# Patient Record
Sex: Female | Born: 1985 | Hispanic: Yes | Marital: Married | Smoking: Never smoker
Health system: Southern US, Community
[De-identification: ages and names within clinical notes are randomized; demographics above are authoritative.]

## PROBLEM LIST (undated history)

## (undated) DIAGNOSIS — K219 Gastro-esophageal reflux disease without esophagitis: Secondary | ICD-10-CM

## (undated) DIAGNOSIS — F32A Depression, unspecified: Secondary | ICD-10-CM

## (undated) DIAGNOSIS — Z8759 Personal history of other complications of pregnancy, childbirth and the puerperium: Secondary | ICD-10-CM

## (undated) DIAGNOSIS — Z789 Other specified health status: Secondary | ICD-10-CM

## (undated) DIAGNOSIS — R45851 Suicidal ideations: Secondary | ICD-10-CM

## (undated) DIAGNOSIS — I729 Aneurysm of unspecified site: Secondary | ICD-10-CM

## (undated) DIAGNOSIS — R0602 Shortness of breath: Secondary | ICD-10-CM

## (undated) DIAGNOSIS — G459 Transient cerebral ischemic attack, unspecified: Secondary | ICD-10-CM

## (undated) DIAGNOSIS — C801 Malignant (primary) neoplasm, unspecified: Secondary | ICD-10-CM

## (undated) HISTORY — DX: Transient cerebral ischemic attack, unspecified: G45.9

## (undated) HISTORY — DX: Suicidal ideations: R45.851

## (undated) HISTORY — PX: CHOLECYSTECTOMY: SHX55

## (undated) HISTORY — DX: Depression, unspecified: F32.A

## (undated) HISTORY — PX: NO PAST SURGERIES: SHX2092

---

## 2011-11-02 ENCOUNTER — Ambulatory Visit (INDEPENDENT_AMBULATORY_CARE_PROVIDER_SITE_OTHER): Payer: BC Managed Care – PPO | Admitting: Family Medicine

## 2011-11-02 VITALS — BP 126/82 | HR 113 | Temp 98.2°F | Resp 16 | Ht 65.0 in | Wt 145.2 lb

## 2011-11-02 DIAGNOSIS — L709 Acne, unspecified: Secondary | ICD-10-CM

## 2011-11-02 DIAGNOSIS — Z131 Encounter for screening for diabetes mellitus: Secondary | ICD-10-CM

## 2011-11-02 MED ORDER — NORGESTIM-ETH ESTRAD TRIPHASIC 0.18/0.215/0.25 MG-25 MCG PO TABS: 1.0000 | ORAL_TABLET | Freq: Every day | ORAL | Status: DC

## 2011-11-02 NOTE — Progress Notes (Signed)
New Patient Visit:  HPI:  Pt presents today with some concern for possible diabetes.  Pt states that she has a significant family history diabetes and would like to be checked for this.  No polydypsia. Mild polyuria. Mild polyphagia.  Weight has been stable.  No visual changes.   Pt also reports hx/o recurrent acne.  This has been present for > 2years.  Acne started after pt started and then stopped OCPs.  Pt denies acne prior to OCP course.  Has been seen by derm in the past.  Has been on oral and topical meds with mild improvement in sxs.  Pt is tired of taking meds and cannot afford specialist visits.  Is considering going back on OCPs to help the acne.  Currently married. Does not desire children.   There is no problem list on file for this patient.  Past Medical History: History reviewed. No pertinent past medical history.  Past Surgical History: History reviewed. No pertinent past surgical history.  Social History: History   Social History  . Marital Status: Married    Spouse Name: N/A    Number of Children: N/A  . Years of Education: N/A   Social History Main Topics  . Smoking status: Never Smoker   . Smokeless tobacco: None  . Alcohol Use: 1.8 oz/week    3 Cans of beer per week  . Drug Use: No  . Sexually Active: Yes    Birth Control/ Protection: None   Other Topics Concern  . None   Social History Narrative  . None    Family History: History reviewed. No pertinent family history.  Allergies: Not on File  Current Outpatient Prescriptions  Medication Sig Dispense Refill  . dimethicone 1 % cream Apply topically 2 (two) times daily as needed.      . Norgestimate-Ethinyl Estradiol Triphasic 0.18/0.215/0.25 MG-25 MCG tab Take 1 tablet by mouth daily.  1 Package  11   Review Of Systems: 12 point ROS negative except as noted above in HPI.   Physical Exam: Filed Vitals:   11/02/11 1045  BP: 126/82  Pulse: 113  Temp: 98.2 F (36.8 C)  Resp: 16     General: alert and cooperative HEENT: PERRLA and extra ocular movement intact Heart: S1, S2 normal, no murmur, rub or gallop, regular rate and rhythm Lungs: clear to auscultation, no wheezes or rales and unlabored breathing Abdomen: abdomen is soft without significant tenderness, masses, organomegaly or guarding Extremities: extremities normal, atraumatic, no cyanosis or edema Skin:diffuse papules and erythema consistent with acne  Neurology: normal without focal findings  Labs and Imaging: No results found for this basename: na, k, cl, co2, bun, creatinine, glucose   No results found for this basename: WBC, HGB, HCT, MCV, PLT    Assessment and Plan: Will screen for DM.  Will also check TSH and metabolic function.  Will start on sprintec for contraceptive coverage and acne suppression.  Consider re-referral to derm if acne persists despite OCP treatment.

## 2011-11-02 NOTE — Progress Notes (Deleted)
  Subjective:    Patient ID: Shelby Cunningham, female    DOB: 04-12-1985, 26 y.o.   MRN: [id number]  HPI    Review of Systems     Objective:   Physical Exam        Assessment & Plan:

## 2011-11-03 LAB — COMPREHENSIVE METABOLIC PANEL
AST: 24 U/L (ref 0–37)
Albumin: 4.8 g/dL (ref 3.5–5.2)
Alkaline Phosphatase: 77 U/L (ref 39–117)
BUN: 12 mg/dL (ref 6–23)
Glucose, Bld: 81 mg/dL (ref 70–99)
Potassium: 4.3 mEq/L (ref 3.5–5.3)
Sodium: 139 mEq/L (ref 135–145)
Total Bilirubin: 0.5 mg/dL (ref 0.3–1.2)

## 2011-11-03 LAB — HEMOGLOBIN A1C: Hgb A1c MFr Bld: 5.2 % (ref <5.7)

## 2011-11-11 ENCOUNTER — Telehealth: Payer: Self-pay

## 2011-11-11 NOTE — Telephone Encounter (Signed)
FYI, I called her and advised labs normal, mailed her a copy. Shelby Cunningham

## 2011-11-11 NOTE — Telephone Encounter (Signed)
Patient has called back saying she just started taking BCP and is feeling muscle soreness in thighs and lower back and wants to know if this is normal.   Best [phone]

## 2011-11-11 NOTE — Telephone Encounter (Signed)
Please advise 

## 2011-11-11 NOTE — Telephone Encounter (Signed)
Patient had labs done on 11/02/11 and has not received a call or letter regarding the results.  Best [phone]

## 2011-11-12 NOTE — Telephone Encounter (Signed)
This certainly could be from the birth control as it is a hormone. With any new hormone that you are introducing into your body it will need time to acclimate. If these persist, change, or worsen over the next week please let us know.

## 2011-11-13 NOTE — Telephone Encounter (Signed)
Spoke with pt advised to call us if sx's persist and msg from [place]. Pt understood

## 2012-01-18 DIAGNOSIS — G459 Transient cerebral ischemic attack, unspecified: Secondary | ICD-10-CM

## 2012-01-18 HISTORY — DX: Transient cerebral ischemic attack, unspecified: G45.9

## 2012-01-28 ENCOUNTER — Emergency Department (HOSPITAL_COMMUNITY): Payer: BC Managed Care – PPO

## 2012-01-28 ENCOUNTER — Observation Stay (HOSPITAL_COMMUNITY)
Admission: EM | Admit: 2012-01-28 | Discharge: 2012-01-31 | Disposition: A | Discharge status: Home / Self Care | Payer: BC Managed Care – PPO | Attending: Family Medicine | Admitting: Family Medicine

## 2012-01-28 ENCOUNTER — Encounter (HOSPITAL_COMMUNITY): Payer: Self-pay | Admitting: *Deleted

## 2012-01-28 DIAGNOSIS — E785 Hyperlipidemia, unspecified: Secondary | ICD-10-CM | POA: Insufficient documentation

## 2012-01-28 DIAGNOSIS — G459 Transient cerebral ischemic attack, unspecified: Principal | ICD-10-CM | POA: Diagnosis present

## 2012-01-28 DIAGNOSIS — G8191 Hemiplegia, unspecified affecting right dominant side: Secondary | ICD-10-CM | POA: Diagnosis present

## 2012-01-28 DIAGNOSIS — R209 Unspecified disturbances of skin sensation: Secondary | ICD-10-CM | POA: Insufficient documentation

## 2012-01-28 DIAGNOSIS — I671 Cerebral aneurysm, nonruptured: Secondary | ICD-10-CM | POA: Insufficient documentation

## 2012-01-28 DIAGNOSIS — G8190 Hemiplegia, unspecified affecting unspecified side: Secondary | ICD-10-CM | POA: Insufficient documentation

## 2012-01-28 HISTORY — DX: Shortness of breath: R06.02

## 2012-01-28 HISTORY — DX: Other specified health status: Z78.9

## 2012-01-28 NOTE — ED Notes (Signed)
Pt c/o R arm numbness, sudden onset x 2 hrs ago. Pt states numbness radiated from arm to R neck and then began experiencing R leg pain, numbness and heaviness. Pt c/o intense pain w/ ambulation in medial R knee. Pt's grips, pushes and pulls are equal upon evaluation in triage. Pt is ambulatory w/o gait disturbance or deficit.

## 2012-01-28 NOTE — ED Provider Notes (Signed)
History     CSN: [id number]  Arrival date & time 01/28/12  2221   First MD Initiated Contact with Patient 01/28/12 2321      Chief Complaint  Patient presents with  . Numbness    (Consider location/radiation/quality/duration/timing/severity/associated sxs/prior treatment) The history is provided by the patient.  Symptoms of RUW numbness not tingling per the patient starting approximately 2 hours pta at rest.  No trauma.  No f/c/r.  No weakness.  No changes in vision nor speech.  No repeptitive motion activity.  No antecedent illness.  No CP nor SOB.  Risk factor is patient is on OCP.  No additional medications.    History reviewed. No pertinent past medical history.  History reviewed. No pertinent past surgical history.  History reviewed. No pertinent family history.  History  Substance Use Topics  . Smoking status: Never Smoker   . Smokeless tobacco: Not on file  . Alcohol Use: 1.8 oz/week    3 Cans of beer per week    OB History    Grav Para Term Preterm Abortions TAB SAB Ect Mult Living                  Review of Systems  Neurological: Negative for dizziness, facial asymmetry, light-headedness and headaches.  All other systems reviewed and are negative.    Allergies  Review of patient's allergies indicates no known allergies.  Home Medications   Current Outpatient Rx  Name  Route  Sig  Dispense  Refill  . ASPIRIN-ACETAMINOPHEN-CAFFEINE 250-250-65 MG PO TABS   Oral   Take 2 tablets by mouth every 6 (six) hours as needed.         Darlis Loan ESTRAD TRIPHASIC 0.18/0.215/0.25 MG-25 MCG PO TABS   Oral   Take 1 tablet by mouth daily.           BP 130/83  Pulse 91  Temp 97.8 F (36.6 C) (Oral)  Resp 20  SpO2 100%  LMP 01/27/2012  Physical Exam  Constitutional: She is oriented to person, place, and time. She appears well-developed and well-nourished.  HENT:  Head: Normocephalic and atraumatic.  Mouth/Throat: Oropharynx is clear and moist.    Eyes: Conjunctivae normal and EOM are normal. Pupils are equal, round, and reactive to light.  Neck: Normal range of motion. Neck supple.  Cardiovascular: Normal rate, regular rhythm and intact distal pulses.   Pulmonary/Chest: Effort normal and breath sounds normal. She has no wheezes. She has no rales.  Abdominal: Soft. Bowel sounds are normal. There is no tenderness. There is no rebound and no guarding.  Musculoskeletal: Normal range of motion. She exhibits no tenderness.  Neurological: She is alert and oriented to person, place, and time. She has normal reflexes. She displays normal reflexes. No cranial nerve deficit.  Skin: Skin is warm and dry.  Psychiatric: She has a normal mood and affect.    ED Course  Procedures (including critical care time)  Labs Reviewed - No data to display No results found.   No diagnosis found.    MDM  Seen by Dr. Amada Jupiter of neuro, needs inpatient TIA work up        Nakyiah Kuck Smitty Cords, MD 01/29/12 [phone]

## 2012-01-29 ENCOUNTER — Observation Stay (HOSPITAL_COMMUNITY): Payer: BC Managed Care – PPO

## 2012-01-29 ENCOUNTER — Encounter (HOSPITAL_COMMUNITY): Payer: Self-pay | Admitting: Emergency Medicine

## 2012-01-29 DIAGNOSIS — G8191 Hemiplegia, unspecified affecting right dominant side: Secondary | ICD-10-CM | POA: Diagnosis present

## 2012-01-29 DIAGNOSIS — G8190 Hemiplegia, unspecified affecting unspecified side: Secondary | ICD-10-CM

## 2012-01-29 DIAGNOSIS — G459 Transient cerebral ischemic attack, unspecified: Principal | ICD-10-CM

## 2012-01-29 LAB — CBC WITH DIFFERENTIAL/PLATELET
Basophils Absolute: 0.1 10*3/uL (ref 0.0–0.1)
Basophils Relative: 1 % (ref 0–1)
Eosinophils Absolute: 0.2 10*3/uL (ref 0.0–0.7)
Eosinophils Relative: 3 % (ref 0–5)
HCT: 41.6 % (ref 36.0–46.0)
Hemoglobin: 14.4 g/dL (ref 12.0–15.0)
MCH: 28.1 pg (ref 26.0–34.0)
MCHC: 34.6 g/dL (ref 30.0–36.0)
MCV: 81.1 fL (ref 78.0–100.0)
Monocytes Absolute: 0.6 10*3/uL (ref 0.1–1.0)
Monocytes Relative: 8 % (ref 3–12)
Neutro Abs: 3.9 10*3/uL (ref 1.7–7.7)
RDW: 12.2 % (ref 11.5–15.5)

## 2012-01-29 LAB — POCT I-STAT, CHEM 8
Calcium, Ion: 1.23 mmol/L (ref 1.12–1.23)
Chloride: 105 mEq/L (ref 96–112)
Glucose, Bld: 96 mg/dL (ref 70–99)
HCT: 44 % (ref 36.0–46.0)
Hemoglobin: 15 g/dL (ref 12.0–15.0)
TCO2: 27 mmol/L (ref 0–100)

## 2012-01-29 LAB — HEMOGLOBIN A1C: Hgb A1c MFr Bld: 5.4 % (ref <5.7)

## 2012-01-29 LAB — PREGNANCY, URINE: Preg Test, Ur: NEGATIVE

## 2012-01-29 MED ORDER — INFLUENZA VIRUS VACC SPLIT PF IM SUSP
0.5000 mL | INTRAMUSCULAR | Status: AC
  Filled 2012-01-29: qty 0.5

## 2012-01-29 MED ORDER — LORAZEPAM 2 MG/ML IJ SOLN
0.5000 mg | Freq: Once | INTRAMUSCULAR | Status: AC
  Administered 2012-01-29: 1 mg via INTRAVENOUS
  Filled 2012-01-29: qty 1

## 2012-01-29 MED ORDER — ASPIRIN EC 325 MG PO TBEC
325.0000 mg | DELAYED_RELEASE_TABLET | Freq: Every day | ORAL | Status: DC
  Administered 2012-01-29: 325 mg via ORAL
  Filled 2012-01-29: qty 1

## 2012-01-29 MED ORDER — ACETAMINOPHEN 325 MG PO TABS
650.0000 mg | ORAL_TABLET | ORAL | Status: DC | PRN
  Administered 2012-01-30 – 2012-01-31 (×2): 650 mg via ORAL
  Filled 2012-01-29 (×2): qty 2

## 2012-01-29 MED ORDER — GADOBENATE DIMEGLUMINE 529 MG/ML IV SOLN
13.0000 mL | Freq: Once | INTRAVENOUS | Status: AC | PRN
  Administered 2012-01-29: 13 mL via INTRAVENOUS

## 2012-01-29 MED ORDER — SODIUM CHLORIDE 0.9 % IV SOLN
INTRAVENOUS | Status: AC
  Administered 2012-01-29: 13:00:00 via INTRAVENOUS

## 2012-01-29 MED ORDER — ASPIRIN 325 MG PO TABS
325.0000 mg | ORAL_TABLET | Freq: Every day | ORAL | Status: DC
  Administered 2012-01-29 – 2012-01-31 (×3): 325 mg via ORAL
  Filled 2012-01-29 (×4): qty 1

## 2012-01-29 NOTE — ED Notes (Signed)
Mri CALLED TO ADVISE THAT SHE IS IN ROUTE TO DO PT mra?mri.  PT MADE AWARE

## 2012-01-29 NOTE — ED Notes (Signed)
Patient transported to MRI 

## 2012-01-29 NOTE — ED Notes (Signed)
Pt returned from MRI °

## 2012-01-29 NOTE — H&P (Signed)
PCP:  none   Chief Complaint:   Right side weakness  HPI: Shelby Cunningham is a 27 y.o. female   has no past medical history on file.   Presented with  8 pm she was watching TV and her whole Right arm was numb then her entire Right side was numb. She felt some tingling and numbness over right side of her face as well. She says it felt a little weak but she was able to move it and walk although with some difficulty. CT scan was unremarkable. Patient states she have had some sensation of her right side being heavy for the past year but the numbness was new. Since the event she has been able to ambulate without difficulty. She does currently endorses mild pain around right knee and sensation of discomfort around her right arm. Neurology have come down and evaluated the patient and wish for her to undergo TIA work up. Hospitalist to admit.   Review of Systems:    Pertinent positives include:   tingling,  Weakness, localizing neurological complaints   Constitutional:  No weight loss, night sweats, Fevers, chills, fatigue, weight loss  HEENT:  No headaches, Difficulty swallowing,Tooth/dental problems,Sore throat,  No sneezing, itching, ear ache, nasal congestion, post nasal drip,  Cardio-vascular:  No chest pain, Orthopnea, PND, anasarca, dizziness, palpitations.no Bilateral lower extremity swelling  GI:  No heartburn, indigestion, abdominal pain, nausea, vomiting, diarrhea, change in bowel habits, loss of appetite, melena, blood in stool, hematemesis Resp:  no shortness of breath at rest. No dyspnea on exertion, No excess mucus, no productive cough, No non-productive cough, No coughing up of blood.No change in color of mucus.No wheezing. Skin:  no rash or lesions. No jaundice GU:  no dysuria, change in color of urine, no urgency or frequency. No straining to urinate.  No flank pain.  Musculoskeletal:  no joint swelling. No decreased range of motion. No back pain.  Psych:  No change in  mood or affect. No depression or anxiety. No memory loss.  Neuro: no no double vision, no gait abnormality, no slurred speech, no confusion  Otherwise ROS are negative except for above, 10 systems were reviewed  Past Medical History: History reviewed. No pertinent past medical history. History reviewed. No pertinent past surgical history.   Medications: Prior to Admission medications   Medication Sig Start Date End Date Taking? Authorizing Provider  aspirin-acetaminophen-caffeine (PAMPRIN MAX) [phone] MG per tablet Take 2 tablets by mouth every 6 (six) hours as needed.   Yes Historical Provider, MD  Norgestimate-Ethinyl Estradiol Triphasic 0.18/0.215/0.25 MG-25 MCG tab Take 1 tablet by mouth daily. 11/02/11  Yes Doree Albee, MD    Allergies:  No Known Allergies  Social History:  Ambulatory   independently   Lives at   Home with husband   reports that she has never smoked. She does not have any smokeless tobacco history on file. She reports that she drinks about 1.8 ounces of alcohol per week. She reports that she does not use illicit drugs.   Family History: family history includes Diabetes in her mother.    Physical Exam: Patient Vitals for the past 24 hrs:  BP Temp Temp src Pulse Resp SpO2  01/28/12 2232 130/83 mmHg 97.8 F (36.6 C) Oral 91  20  100 %    1. General:  in No Acute distress 2. Psychological: Alert and  Oriented 3. Head/ENT:   Moist  Mucous Membranes  Head Non traumatic, neck supple                          Normal   Dentition 4. SKIN: normal   Skin turgor,  Skin clean Dry and intact no rash 5. Heart: Regular rate and rhythm no Murmur, Rub or gallop 6. Lungs: Clear to auscultation bilaterally, no wheezes or crackles   7. Abdomen: Soft, non-tender, Non distended 8. Lower extremities: no clubbing, cyanosis, or edema 9. Neurologically strength 5/5 in all 4 extremities, CN 2-12 intact no nastagmus present. Finger to nose  coordination intact bilaterally.  10. MSK: Normal range of motion  body mass index is unknown because there is no height or weight on file.   Labs on Admission:   [facility] 01/29/12 0008  NA 141  K 3.5  CL 105  CO2 --  GLUCOSE 96  BUN 12  CREATININE 0.80  CALCIUM --  MG --  PHOS --   No results found for this basename: AST:2,ALT:2,ALKPHOS:2,BILITOT:2,PROT:2,ALBUMIN:2 in the last 72 hours No results found for this basename: LIPASE:2,AMYLASE:2 in the last 72 hours  Basename 01/29/12 0008 01/28/12 2345  WBC -- 7.7  NEUTROABS -- 3.9  HGB 15.0 14.4  HCT 44.0 41.6  MCV -- 81.1  PLT -- 283   No results found for this basename: CKTOTAL:3,CKMB:3,CKMBINDEX:3,TROPONINI:3 in the last 72 hours No results found for this basename: TSH,T4TOTAL,FREET3,T3FREE,THYROIDAB in the last 72 hours No results found for this basename: VITAMINB12:2,FOLATE:2,FERRITIN:2,TIBC:2,IRON:2,RETICCTPCT:2 in the last 72 hours Lab Results  Component Value Date   HGBA1C 5.2 11/02/2011    The CrCl is unknown because both a height and weight (above a minimum accepted value) are required for this calculation. ABG    Component Value Date/Time   TCO2 27 01/29/2012 0008     No results found for this basename: DDIMER     Cultures: No results found for this basename: sdes, specrequest, cult, reptstatus       Radiological Exams on Admission: Ct Head Wo Contrast  01/29/2012  *RADIOLOGY REPORT*  Clinical Data:  Right-sided numbness and knee pain.  CT HEAD WITHOUT CONTRAST CT CERVICAL SPINE WITHOUT CONTRAST  Technique:  Multidetector CT imaging of the head and cervical spine was performed following the standard protocol without intravenous contrast.  Multiplanar CT image reconstructions of the cervical spine were also generated.  Comparison:   None  CT HEAD  Findings: No acute intracranial hemorrhage.  No focal mass lesion. No CT evidence of acute infarction.   No midline shift or mass effect.  No hydrocephalus.   Basilar cisterns are patent. Paranasal sinuses and mastoid air cells are clear.  Orbits are normal.  IMPRESSION: Normal head CT  CT CERVICAL SPINE  Findings: No prevertebral soft tissue swelling.  Normal alignment of cervical vertebral bodies.  No loss of vertebral body height. Normal facet articulation.  Normal craniocervical junction.  No evidence epidural or paraspinal hematoma.  IMPRESSION: No acute or significant chronic findings in the cervical spine.   Original Report Authenticated By: Genevive Bi, M.D.    Ct Cervical Spine Wo Contrast  01/29/2012  *RADIOLOGY REPORT*  Clinical Data:  Right-sided numbness and knee pain.  CT HEAD WITHOUT CONTRAST CT CERVICAL SPINE WITHOUT CONTRAST  Technique:  Multidetector CT imaging of the head and cervical spine was performed following the standard protocol without intravenous contrast.  Multiplanar CT image reconstructions of the cervical spine were also generated.  Comparison:   None  CT HEAD  Findings: No acute intracranial hemorrhage.  No focal mass lesion. No CT evidence of acute infarction.   No midline shift or mass effect.  No hydrocephalus.  Basilar cisterns are patent. Paranasal sinuses and mastoid air cells are clear.  Orbits are normal.  IMPRESSION: Normal head CT  CT CERVICAL SPINE  Findings: No prevertebral soft tissue swelling.  Normal alignment of cervical vertebral bodies.  No loss of vertebral body height. Normal facet articulation.  Normal craniocervical junction.  No evidence epidural or paraspinal hematoma.  IMPRESSION: No acute or significant chronic findings in the cervical spine.   Original Report Authenticated By: Genevive Bi, M.D.     Chart has been reviewed  Assessment/Plan  27 yo with atypical neurological complaints worrisome for possible TIA  Present on Admission:  . TIA (transient ischemic attack)  - will admit based on TIA/CVA protocol, await results of MRA/MRI, Carotid Doppler and Echo, obtain cardiac enzymes,  ECG, Lipid  panel, TSH. Order PT/OT evaluation. Will make sure patient is on antiplatelet agent.   Neurology is aware of the patient.    . 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 Etiology unclear will do TIA work up, also would order ANA and Sed rate given young age of the patient and complaints of joint discomfort.    Prophylaxis: SCD   CODE STATUS: FULL CODE  Other plan as per orders.  I have spent a total of 45 min on this admission  Veronique Warga 01/29/2012, 5:02 AM

## 2012-01-29 NOTE — Consult Note (Signed)
Reason for Consult:Transient right sided numbness Referring Physician: Palumbo-Rasch, April  CC: right sided numbness  History is obtained from:Patient, boyfriend  HPI: Shelby Cunningham is a 27 y.o. female who states that late last evening, she noticed that her arm became numb. This began to improve over the course of about ten minutes and she went to lay down, then her entire right side went numb including the right back of the head, but sparing the face. The onset of both the events was sudden without spread. She denies headache or neck pain. This numbness resolved over approximately 2 hours. She is currently back to baseline. She does not have any medical problems, but does take OCPs. She also notes that for approximately 1 year, when working out, her right side feels "heavy."  ROS: A 14 point ROS was performed and is negative except as noted in the HPI.  History reviewed. No pertinent past medical history.  Family History: No history of stroke  Social History: Tob: denies  Exam: Current vital signs: BP 130/83  Pulse 91  Temp 97.8 F (36.6 C) (Oral)  Resp 20  SpO2 100%  LMP 01/27/2012 Vital signs in last 24 hours: Temp:  [97.8 F (36.6 C)] 97.8 F (36.6 C) (01/11 2232) Pulse Rate:  [91] 91  (01/11 2232) Resp:  [20] 20  (01/11 2232) BP: (130)/(83) 130/83 mmHg (01/11 2232) SpO2:  [100 %] 100 % (01/11 2232)  General: In bed, NAD CV: RRR Mental Status: Patient is awake, alert, oriented to person, place, month, year, and situation. Immediate and remote memory are intact. Patient is able to give a clear and coherent history. No signs of aphasia.  Cranial Nerves: II: Visual Fields are full. Pupils are equal, round, and reactive to light.  Discs are sharp. III,IV, VI: EOMI without ptosis or diploplia.  V: Facial sensation is symmetric to temperature VII: Facial movement is symmetric.  VIII: hearing is intact to voice X: Uvula elevates symmetrically XI: Shoulder shrug is  symmetric. XII: tongue is midline without atrophy or fasciculations.  Motor: Tone is normal. Bulk is normal. 5/5 strength was present in all four extremities. No drift Sensory: Sensation is symmetric to light touch and pin in the arms and legs. Deep Tendon Reflexes: 2+ and symmetric in the biceps and patellae.  Plantars: Toes are downgoing bilaterally.  Cerebellar: FNF intact bilaterally Gait: Patient has a stable casual gait. Able to tandem.  I have reviewed labs in epic and the results pertinent to this consultation are: CBC, BMP unremarkable Urine preg negative.   I have reviewed the images obtained:CT head - negative.   Impression: 27 yo F with transient right sided numbness that has resolved. The suddenness of the onset and lack of headache would argue against migraine. Differential would include C-Spine disease, demyelinating disease, and TIA. An MRI brain will need to be performed. If no clear other cause of her symptoms, then I would treat this as TIA.   Recommendations: 1) MRI Brain and C-Spine w/wo contrast 2) MRA head.  3) Lipid panel, A1C 4) ASA 325mg  5) If MRI reveals stroke or if negative then I would consider this a TIA:     -echocardiogram and carotid dopplers.      -patient should be counseled not to resume hormonal birth control.         Ritta Slot, MD [facility] [phone]  If 7pm- 7am, please page neurology on call at [phone].

## 2012-01-29 NOTE — ED Notes (Signed)
MRI is here to do test. Pt states that she is claustrophobic. Put call in for admitting Dr to get order for meds so pt can go to MRI.

## 2012-01-30 ENCOUNTER — Encounter (HOSPITAL_COMMUNITY): Payer: Self-pay | Admitting: General Practice

## 2012-01-30 DIAGNOSIS — G459 Transient cerebral ischemic attack, unspecified: Secondary | ICD-10-CM

## 2012-01-30 LAB — HIV ANTIBODY (ROUTINE TESTING W REFLEX): HIV: NONREACTIVE

## 2012-01-30 LAB — ANTI-NUCLEAR AB-TITER (ANA TITER)

## 2012-01-30 LAB — LIPID PANEL
Cholesterol: 190 mg/dL (ref 0–200)
Total CHOL/HDL Ratio: 3.6 RATIO
VLDL: 19 mg/dL (ref 0–40)

## 2012-01-30 LAB — RPR: RPR Ser Ql: NONREACTIVE

## 2012-01-30 MED ORDER — SODIUM CHLORIDE 0.9 % IV SOLN: INTRAVENOUS | Status: DC

## 2012-01-30 MED ORDER — SIMVASTATIN 10 MG PO TABS
10.0000 mg | ORAL_TABLET | Freq: Every day | ORAL | Status: DC
  Administered 2012-01-30: 10 mg via ORAL
  Filled 2012-01-30 (×2): qty 1

## 2012-01-30 NOTE — Evaluation (Signed)
Occupational Therapy Evaluation and Discharge Summary Patient Details Name: Shelby Cunningham MRN: [id number] DOB: 1985-10-25 Today's Date: 01/30/2012 Time: 1250-1300 OT Time Calculation (min): 10 min  OT Assessment / Plan / Recommendation Clinical Impression  Pt admitted for TIA and numbness/weakness in R side.  Pts symptoms have resolved and she is at baseline.  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  Pt to follow up with Neurosurgeon for this.  TIA work up still in progress.    OT Assessment  Patient does not need any further OT services    Follow Up Recommendations  No OT follow up    Barriers to Discharge      Equipment Recommendations  None recommended by OT    Recommendations for Other Services    Frequency       Precautions / Restrictions Precautions Precautions: None Restrictions Weight Bearing Restrictions: No   Pertinent Vitals/Pain Pt with no pain.  Vitals stable.    ADL  Eating/Feeding: Performed;Independent Where Assessed - Eating/Feeding: Chair Grooming: Simulated;Independent Where Assessed - Grooming: Unsupported sitting Upper Body Bathing: Simulated;Independent Where Assessed - Upper Body Bathing: Unsupported sitting Lower Body Bathing: Simulated;Independent Where Assessed - Lower Body Bathing: Unsupported sit to stand Upper Body Dressing: Simulated;Independent Where Assessed - Upper Body Dressing: Unsupported sitting Lower Body Dressing: Simulated;Independent Where Assessed - Lower Body Dressing: Unsupported sit to stand Toilet Transfer: Performed;Independent Toilet Transfer Method: Sit to Barista: Regular height toilet Toileting - Clothing Manipulation and Hygiene: Performed;Independent Where Assessed - Toileting Clothing Manipulation and Hygiene: Standing Transfers/Ambulation Related to ADLs: independent ADL Comments: independent    OT Diagnosis:    OT Problem List:   OT Treatment Interventions:     OT Goals    Visit  Information  Last OT Received On: 01/30/12 Assistance Needed: +1    Subjective Data  Subjective: " I feel back to normal." Patient Stated Goal: to go home.   Prior Functioning     Home Living Lives With: Family Available Help at Discharge: Available 24 hours/day Prior Function Level of Independence: Independent Able to Take Stairs?: Yes Driving: Yes Vocation: Other (comment) Comments: student         Vision/Perception Vision - Assessment Eye Alignment: Within Functional Limits Vision Assessment: Vision not tested Additional Comments: Pts vision was not affected.   Cognition  Overall Cognitive Status: Appears within functional limits for tasks assessed/performed Arousal/Alertness: Awake/alert Orientation Level: Oriented X4 / Intact Behavior During Session: WFL for tasks performed Cognition - Other Comments: intact    Extremity/Trunk Assessment Right Upper Extremity Assessment RUE ROM/Strength/Tone: Within functional levels RUE Sensation: WFL - Light Touch RUE Coordination: WFL - gross/fine motor Left Upper Extremity Assessment LUE ROM/Strength/Tone: Within functional levels LUE Sensation: WFL - Light Touch LUE Coordination: WFL - gross/fine motor Trunk Assessment Trunk Assessment: Normal     Mobility Transfers Transfers: Sit to Stand;Stand to Sit Sit to Stand: 7: Independent Stand to Sit: 7: Independent Details for Transfer Assistance: independent with all mobility     Shoulder Instructions     Exercise     Balance     End of Session OT - End of Session Activity Tolerance: Patient tolerated treatment well Patient left: in bed;with call bell/phone within reach;with family/visitor present Nurse Communication: Mobility status  GO Functional Assessment Tool Used: clinical observation Functional Limitation: Self care Self Care Current Status (W0981): 0 percent impaired, limited or restricted Self Care Goal Status (X9147): 0 percent impaired, limited  or restricted Self Care Discharge Status (W2956): 0  percent impaired, limited or restricted   Hope Budds 01/30/2012, 1:13 PM [phone]

## 2012-01-30 NOTE — Progress Notes (Signed)
PROGRESS NOTE  Shelby Cunningham ZOX:[id number] DOB: 1986/01/07 DOA: 01/28/2012 PCP: No primary provider on file. sees [facility]-have informed family medicine service of this  Brief narrative: 27 year old Hispanic female admitted at [facility] 01/30/2012 and transferred to [facility] for concerns of TIA/stroke-she states she started having weakness in her right leg which progressed to numbness and strange altered feeling which has totally resolved at present  Past medical history-As per Problem list Chart reviewed as below- Nothing significant  Consultants:  Neurosurgery  Neurology  Cardiology   Procedures:  CT Head 1/12 IMPRESSION: Normal head CT  Ct head Cervical spine 1/12 No acute or significant chronic findings in the cervical spine.  MRA HEAD 1/121. 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 2. Otherwise negative anteriorcirculation. Normal anatomic variation of the ACA branches. 3. Negative posterior circulation.   MRI head 1/121. Normal MRI appearance of the cervical spine and spinal cord. 2. See MRA abnormal findings below.  Antibiotics:  None currently   Subjective  Doing well. No focal deficits. Ambulant. No chest pain no nausea no vomiting no shortness of breath. Denies any vertigo   Objective    Interim History: I did speak to Dr. Wynetta Emery about her this am  Telemetry: Normal sinus rhythm, occasional bursts of tachycardia to the 130s   Objective: Filed Vitals:   01/29/12 2100 01/30/12 0500 01/30/12 0800 01/30/12 1245  BP: 124/77 130/69 110/70 123/61  Pulse: 69 61 66 67  Temp: 98.5 F (36.9 C) 98 F (36.7 C) 97.7 F (36.5 C) 97.7 F (36.5 C)  TempSrc:   Oral Oral  Resp: 16 16 16 16   Height:    5\' 5"  (1.651 m)  Weight:  66.044 kg (145 lb 9.6 oz)    SpO2: 97% 99% 100% 100%    Intake/Output Summary (Last 24 hours) at 01/30/12 1346 Last data filed at 01/30/12 1245  Gross per 24 hour  Intake    600 ml  Output     175 ml  Net    425 ml    Exam:  General: Alert pleasant oriented Hispanic female, significant acne  Cardiovascular: S1-S2 no murmur rub or gallop. Rhythm  Respiratory: Clinically clear  Abdomen: Soft nontender nondistended  Skin acne  Neuropower 5/5, reflexes 3/3 borderline brisk, no focal deficit, uvula midline, smile symmetric, power in shoulder girdle and with rotation of the head is normal, extraocular movements intact, vision by direct confrontation is intact  Data Reviewed: Basic Metabolic Panel:  Lab 01/29/12 8119  NA 141  K 3.5  CL 105  CO2 --  GLUCOSE 96  BUN 12  CREATININE 0.80  CALCIUM --  MG --  PHOS --   Liver Function Tests: No results found for this basename: AST:5,ALT:5,ALKPHOS:5,BILITOT:5,PROT:5,ALBUMIN:5 in the last 168 hours No results found for this basename: LIPASE:5,AMYLASE:5 in the last 168 hours No results found for this basename: AMMONIA:5 in the last 168 hours CBC:  Lab 01/29/12 0008 01/28/12 2345  WBC -- 7.7  NEUTROABS -- 3.9  HGB 15.0 14.4  HCT 44.0 41.6  MCV -- 81.1  PLT -- 283   Cardiac Enzymes: No results found for this basename: CKTOTAL:5,CKMB:5,CKMBINDEX:5,TROPONINI:5 in the last 168 hours BNP: No components found with this basename: POCBNP:5 CBG: No results found for this basename: GLUCAP:5 in the last 168 hours  No results found for this or any previous visit (from the past 240 hour(s)).   Studies:  All Imaging reviewed and is as per above notation   Scheduled Meds:   . aspirin  325 mg Oral Daily  . influenza  inactive virus vaccine  0.5 mL Intramuscular Tomorrow-1000   Continuous Infusions:    Assessment/Plan: 1. Potential TIA?-Workup per neurology-Hypercoagulable panel (except Factor V Leiden & Beta-2-glycoprotein, which are both associated with venous not arterial infarcts), Vasculitic labs (C3, C4, CH50, ESR, ANA) HIV & RPR ordered as per Neuro rec's-sedimentation rate is 11, ANA is abnormal but would  expect Sed rate to be elevated-will order DS dna-continue aspirin 325 mg-discontinued oral contraceptive pills-consider barrier methods versus copper T. and counseling for this an outpatient 2. 4 mm x 4 mm x 4 mm hypophyseal aneurysm-need potential outpatient followup with Dr. Coletta Memos [Ns who does vascular malformations] or Dr. Corliss Skains [neuroradio]-Dr. cram's opinion was that this is not this correlate with patient's findings and can be non-urgently followed up. Greatly appreciate input-patient states that she has a young relative who had some type of brain malformation and died from this 3. Hyperlipidemia-LDL 118. 4. Impaired glucose tolerance test-A1c 5.4-monitor  Code Status: Full  Family Communication: Spoke with husband at bedside  Disposition Plan: As discussed with family medicine attending Dr.McDiarmid-patient is a [facility] patient-patient to be taken over by them in am   Pleas Koch, MD  [facility] Pager [phone] 01/30/2012, 1:46 PM    LOS: 2 days

## 2012-01-30 NOTE — Progress Notes (Signed)
VASCULAR LAB PRELIMINARY  PRELIMINARY  PRELIMINARY  PRELIMINARY  Carotid Dopplers completed.    Preliminary report:  There is no ICA stenosis.  Vertebral artery flow is antegrade.  Kobyn Kray, RVT 01/30/2012, 10:00 AM

## 2012-01-30 NOTE — Progress Notes (Signed)
PT Cancellation Note/ Signing Off  Patient Details Name: Shelby Cunningham MRN: [id number] DOB: [birth year]   Cancelled Treatment:    Reason Eval/Treat Not Completed: Other (comment) (per OT, pt returned to baseline, no therapy needs.)   Morley Gaumer, [place] 01/30/2012, 2:08 PM

## 2012-01-30 NOTE — Progress Notes (Signed)
Stroke Team Progress Note  HISTORY Shelby Cunningham is a 27 y.o. female who states that late last evening 01/28/2012, she noticed that her arm became numb. This began to improve over the course of about ten minutes and she went to lay down, then her entire right side went numb including the right back of the head, but sparing the face. The onset of both the events was sudden without spread. She denies headache or neck pain. This numbness resolved over approximately 2 hours. She is currently back to baseline. She does not have any medical problems, but does take OCPs. She also notes that for approximately 1 year, when working out, her right side feels "heavy."  She was admitted for further evaluation and treatment.  SUBJECTIVE No family is at the bedside.  Overall she feels her condition is stable. Family member died when younger that her of a "brain problem". She lives here with her husband.   OBJECTIVE Most recent Vital Signs: Filed Vitals:   01/29/12 1500 01/29/12 2100 01/30/12 0500 01/30/12 0800  BP: 106/68 124/77 130/69 110/70  Pulse: 68 69 61 66  Temp: 98.4 F (36.9 C) 98.5 F (36.9 C) 98 F (36.7 C) 97.7 F (36.5 C)  TempSrc: Oral   Oral  Resp: 18 16 16 16   Weight:   66.044 kg (145 lb 9.6 oz)   SpO2: 98% 97% 99% 100%   IV Fluid Intake:     MEDICATIONS    . aspirin  325 mg Oral Daily  . influenza  inactive virus vaccine  0.5 mL Intramuscular Tomorrow-1000   PRN:  acetaminophen  Diet:  Cardiac thin liquids Activity:   Bathroom privileges with assistance DVT Prophylaxis:  SCDs   CLINICALLY SIGNIFICANT STUDIES Basic Metabolic Panel:  Lab 01/29/12 5784  NA 141  K 3.5  CL 105  CO2 --  GLUCOSE 96  BUN 12  CREATININE 0.80  CALCIUM --  MG --  PHOS --   Liver Function Tests: No results found for this basename: AST:2,ALT:2,ALKPHOS:2,BILITOT:2,PROT:2,ALBUMIN:2 in the last 168 hours CBC:  Lab 01/29/12 0008 01/28/12 2345  WBC -- 7.7  NEUTROABS -- 3.9  HGB 15.0 14.4  HCT  44.0 41.6  MCV -- 81.1  PLT -- 283   Coagulation: No results found for this basename: LABPROT:4,INR:4 in the last 168 hours Cardiac Enzymes: No results found for this basename: CKTOTAL:3,CKMB:3,CKMBINDEX:3,TROPONINI:3 in the last 168 hours Urinalysis: No results found for this basename: COLORURINE:2,APPERANCEUR:2,LABSPEC:2,PHURINE:2,GLUCOSEU:2,HGBUR:2,BILIRUBINUR:2,KETONESUR:2,PROTEINUR:2,UROBILINOGEN:2,NITRITE:2,LEUKOCYTESUR:2 in the last 168 hours  Lipid Panel     Component Value Date/Time   CHOL 190 01/30/2012 0755   TRIG 93 01/30/2012 0755   HDL 53 01/30/2012 0755   CHOLHDL 3.6 01/30/2012 0755   VLDL 19 01/30/2012 0755   LDLCALC 118* 01/30/2012 0755   HgbA1C  Lab Results  Component Value Date   HGBA1C 5.4 01/29/2012   Urine Drug Screen:   No results found for this basename: labopia, cocainscrnur, labbenz, amphetmu, thcu, labbarb    Alcohol Level: No results found for this basename: ETH:2 in the last 168 hours  ANA positive  ESR normal  CT of the brain  01/29/2012   Normal head  Ct Cervical Spine Wo Contrast 01/29/2012  No acute or significant chronic findings in the cervical spine.   MRI of the brain  01/29/2012   Normal MRI appearance of the brain.  MRA of the brain  01/29/2012  1. 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 2.  Otherwise negative anterior circulation.  Normal anatomic variation of the ACA branches. 3.  Negative posterior circulation.     MRI Cervical Spine 01/29/2012   Normal MRI appearance of the cervical spine and spinal cord.  2D Echocardiogram  EF 55-60% with no source of embolus.   Carotid Doppler  No evidence of hemodynamically significant internal carotid artery stenosis. Vertebral artery flow is antegrade.   EKG  normal EKG, normal sinus rhythm.   Therapy Recommendations no therapy needs  Physical Exam   Young Hispanic lady currently not in distress. Face shows multiple acne.Awake alert. Afebrile. Head is nontraumatic. Neck  is supple without bruit. Hearing is normal. Cardiac exam no murmur or gallop. Lungs are clear to auscultation. Distal pulses are well felt.  Neurological Exam ; Awake  Alert oriented x 3. Normal speech and language.eye movements full without nystagmus. Fundi were not visualized. Vision acuity and fields appear normal Face symmetric. Tongue midline. Normal strength, tone, reflexes and coordination. Normal sensation. Gait deferred.  ASSESSMENT Shelby Cunningham is a 27 y.o. female presenting with right sided numbness. No acute stroke. Left brain TIA vs symptomatic L ICA hypophoseal aneurysm. Needs stroke workup in the young.  Work up underway. On no antiplatelts prior to admission. Now on aspirin 325 mg orally every day for secondary stroke prevention. Patient with resultant right sided numbness that is improving.   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 TEE to look for embolic source. Arranged with [facility] for tomorrow.  If positive for PFO (patent foramen ovale), check bilateral lower extremity venous dopplers to rule out DVT as possible source of stroke. Stoke workup in the young to include labs to look for hypercoagulable source:  Hypercoagulable panel (except Factor V Leiden & Beta-2-glycoprotein, which are both associated with venous not arterial infarcts), Vasculitic labs (C3, C4, CH50, ESR, ANA) HIV & RPR Neurosurgery and interventional radiology consults - most likely as an OP - Dr. Pearlean Brownie will arrange. Stop BCP, use alternative birth control options Add low dose statin  Annie Main, MSN, RN, ANVP-BC, ANP-BC, [facility] Pager: [phone] 01/30/2012 8:36 AM  I have personally  obtained a history, examined the patient, evaluated imaging results, and formulated the assessment and plan of care. I agree with the above.  Delia Heady, MD Medical Director [facility] Pager: [phone] 01/30/2012 6:53 PM

## 2012-01-30 NOTE — Progress Notes (Signed)
Family Medicine Teaching Service Transfer Note Service Pager: [phone]  Patient name: Shelby Cunningham Medical record number: [id number] Date of birth: Jul 22, 1985 Age: 27 y.o. Gender: female  Primary Care Provider: No primary provider on file.  Brief Hospital coarse: Shelby Cunningham is a 27 y.o. year old female who presented with R sided numbness that progressed to involve her entire right side, right sided weakness, and dysarthria admitted for suspicion of TIA. Her work up thus far has revealed a 4 mm aneurysm felt not to be the source of her symptoms, elevated LDL, mildly elevated A1C, and positive ANA (less than 1:40). TTE and carotid dopplers did not show any sources of embolism and a TEE is planned for today looking for a PFO which could allow a DVT to embolize to the brain. Notably her symptoms have resolved consistent with a TIA.   Interval History Feeling fine this Am, slept well, and NPO since midnight. Denies any numbness, weakness, or dysarthria. Denies any clotting disorder in family, confirms that a young relative in [place] died from a brain malformation described as "something exploded in his head."  Agreeable to stop OCPs, ready to go home when work up complete  Patient Active Problem List  Diagnosis  . TIA (transient ischemic attack)  . Hemiplegia affecting right dominant side   Past Medical History: Past Medical History  Diagnosis Date  . No pertinent past medical history   . Exercise-induced shortness of breath     "sometimes at the gym" (01/30/2012)   Past Surgical History: Past Surgical History  Procedure Date  . No past surgeries    Social History: History  Substance Use Topics  . Smoking status: Never Smoker   . Smokeless tobacco: Never Used  . Alcohol Use: 0.6 oz/week    1 Shots of liquor per week     Comment: 01/30/2012 "3 drinks a month"   For any additional social history documentation, please refer to relevant sections of EMR.  Family History: Family  History  Problem Relation Age of Onset  . Diabetes Mother    Allergies: No Known Allergies No current facility-administered medications on file prior to encounter.   Current Outpatient Prescriptions on File Prior to Encounter  Medication Sig Dispense Refill  . Norgestimate-Ethinyl Estradiol Triphasic 0.18/0.215/0.25 MG-25 MCG tab Take 1 tablet by mouth daily.       Physical Exam: BP 105/71  Pulse 62  Temp 98.2 F (36.8 C) (Oral)  Resp 16  Ht 5\' 5"  (1.651 m)  Wt 145 lb 9.6 oz (66.044 kg)  BMI 24.23 kg/m2  SpO2 100%  LMP 01/27/2012 Exam:  Gen: NAD, alert, cooperative with exam HEENT: NCAT, EOMI, PERRL CV: RRR, good S1/S2, no murmur Resp: CTABL, no wheezes, non-labored Abd: SNTND, BS present, no guarding or organomegaly Ext: No edema, warm Neuro: Alert and oriented, strength 5/5 and sensation intact in all 4 extremities, CN2-12 intact, speaks easily and clearly  Labs and Imaging:  Lab 01/29/12 0008 01/28/12 2345  HGB 15.0 14.4  HCT 44.0 41.6  WBC -- 7.7  PLT -- 283    Lab 01/29/12 0008  NA 141  K 3.5  CL 105  CO2 --  GLUCOSE 96  BUN 12  CREATININE 0.80  CALCIUM --  MG --  PHOS --    CT Head and cervical spine W/O 01/29/2012 IMPRESSION:  Normal head CT, No acute or significant chronic findings in the cervical spine.  MR brain with and w/o brain 01/29/2012 IMPRESSION:  1. Normal MRI appearance  of the brain.  2. See MRA abnormal findings below.  3. Cervical spine findings are below  MR/MRA head without 01/29/2012 IMPRESSION:  1. 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  2. Otherwise negative anterior circulation. Normal anatomic variation of the ACA branches.  3. Negative posterior circulation.  MR Cervical spine With and w/o 01/29/2012 IMPRESSION:  1. Normal MRI appearance of the cervical spine and spinal cord.  TTE 01/30/2012 Impressions: - No cardiac source of emboli was indentified. Recommendations: Consider  transesophageal echocardiography if clinically indicated.   Carotid Doppler 01/30/2012 Preliminary report: There is no ICA stenosis. Vertebral artery flow is antegrade.   Assessment and Plan: Shelby Cunningham is a 27 y.o. year old female presenting with R sided neurological symptoms which have resolved consistent with a TIA.   [facility] previously caring for patient, we appreciate their excellent care.   TIA- symptoms resolved - Neurology consulting- apppreciate recommendations  - Full work up, ASA, Statin, stop OCPs, TEE and venous duplex for DVT if PFO seen. - Hypercoaguable labs pending  - Except for factor V leiden and Beta 2-glycoprotein which are both associated with venous, not arterial, infarcts-  - Thanks to Dr. Mahala Menghini for his starting this work up and useful info  - Antithrombin 3 WNL,, homocysteine WNL - vasculitis Labs- (C3, C4, CH50, ESR, ANA) pending  - ANA weakly positive, DS DNA pending, ESR 11 - HIV, RPR both non-reactive - ASA 325 - discontinue OCPs, consider different methods OP - TEE today, venous duplex BL if PFO  Aneurysm - 4 mm x 4 mm x 4 mm hypophyseal aneurysm - Neurosurgery consulted- appreciate reccomendations - They feel it is an incidental finding and likely not contributing to her symptoms.  - Suggest f/u with Dr. Franky Macho, Neurosurgeon who handles vascular malformations, OP in 1-2 weeks for possible intervention  - patient more concerned as she has young relative who died from a brain malformation  HLD - LDL 118 - Continue zocor  Impaired glucose tolerance - A1C 5.4 - counsel on diet modification - F/u OP with PCP  FEN/GI: Regular diet Prophylaxis: none Disposition: Home pending further work up Code Status: Elwyn Reach, MD 01/31/2012, 8:43 AM

## 2012-01-30 NOTE — Consult Note (Signed)
Reason for Consult: Intracranial aneurysm Referring Physician: Dr. [name] is an 27 y.o. female.  HPI: Patient is a 27 year old female who yesterday experience numbness is starting her right arm progress to her right leg in behind her head behind her right ear this whole episode lasted about our patient emerged from was admitted to the hospital did not experience any weakness at that time felt a little bit weak while she's the hospital but currently she denies any symptoms she's resumed and read return to her baseline she is completely asymptomatic. She denies any headache associated with any vision trouble a nausea or vomiting swallowing difficulty denies any symptoms in the left side of her body this all happened on the right.  History reviewed. No pertinent past medical history.  History reviewed. No pertinent past surgical history.  Family History  Problem Relation Age of Onset  . Diabetes Mother     Social History:  reports that she has never smoked. She does not have any smokeless tobacco history on file. She reports that she drinks about 1.8 ounces of alcohol per week. She reports that she does not use illicit drugs.  Allergies: No Known Allergies  Medications: I have reviewed the patient's current medications.  Results for orders placed during the hospital encounter of 01/28/12 (from the past 48 hour(s))  PREGNANCY, URINE     Status: Normal   Collection Time   01/28/12 11:22 PM      Component Value Range Comment   Preg Test, Ur NEGATIVE  NEGATIVE   CBC WITH DIFFERENTIAL     Status: Abnormal   Collection Time   01/28/12 11:45 PM      Component Value Range Comment   WBC 7.7  4.0 - 10.5 K/uL    RBC 5.13 (*) 3.87 - 5.11 MIL/uL    Hemoglobin 14.4  12.0 - 15.0 g/dL    HCT 29.5  62.1 - 30.8 %    MCV 81.1  78.0 - 100.0 fL    MCH 28.1  26.0 - 34.0 pg    MCHC 34.6  30.0 - 36.0 g/dL    RDW 65.7  84.6 - 96.2 %    Platelets 283  150 - 400 K/uL    Neutrophils Relative  51  43 - 77 %    Neutro Abs 3.9  1.7 - 7.7 K/uL    Lymphocytes Relative 38  12 - 46 %    Lymphs Abs 2.9  0.7 - 4.0 K/uL    Monocytes Relative 8  3 - 12 %    Monocytes Absolute 0.6  0.1 - 1.0 K/uL    Eosinophils Relative 3  0 - 5 %    Eosinophils Absolute 0.2  0.0 - 0.7 K/uL    Basophils Relative 1  0 - 1 %    Basophils Absolute 0.1  0.0 - 0.1 K/uL   POCT I-STAT, CHEM 8     Status: Normal   Collection Time   01/29/12 12:08 AM      Component Value Range Comment   Sodium 141  135 - 145 mEq/L    Potassium 3.5  3.5 - 5.1 mEq/L    Chloride 105  96 - 112 mEq/L    BUN 12  6 - 23 mg/dL    Creatinine, Ser 9.52  0.50 - 1.10 mg/dL    Glucose, Bld 96  70 - 99 mg/dL    Calcium, Ion 8.41  3.24 - 1.23 mmol/L    TCO2 27  0 - 100 mmol/L    Hemoglobin 15.0  12.0 - 15.0 g/dL    HCT 16.1  09.6 - 04.5 %   HEMOGLOBIN A1C     Status: Normal   Collection Time   01/29/12  1:45 PM      Component Value Range Comment   Hemoglobin A1C 5.4  <5.7 %    Mean Plasma Glucose 108  <117 mg/dL   SEDIMENTATION RATE     Status: Normal   Collection Time   01/29/12  1:45 PM      Component Value Range Comment   Sed Rate 11  0 - 22 mm/hr   LIPID PANEL     Status: Abnormal   Collection Time   01/30/12  7:55 AM      Component Value Range Comment   Cholesterol 190  0 - 200 mg/dL    Triglycerides 93  <409 mg/dL    HDL 53  >81 mg/dL    Total CHOL/HDL Ratio 3.6      VLDL 19  0 - 40 mg/dL    LDL Cholesterol 191 (*) 0 - 99 mg/dL     Ct Head Wo Contrast  01/29/2012  *RADIOLOGY REPORT*  Clinical Data:  Right-sided numbness and knee pain.  CT HEAD WITHOUT CONTRAST CT CERVICAL SPINE WITHOUT CONTRAST  Technique:  Multidetector CT imaging of the head and cervical spine was performed following the standard protocol without intravenous contrast.  Multiplanar CT image reconstructions of the cervical spine were also generated.  Comparison:   None  CT HEAD  Findings: No acute intracranial hemorrhage.  No focal mass lesion. No CT  evidence of acute infarction.   No midline shift or mass effect.  No hydrocephalus.  Basilar cisterns are patent. Paranasal sinuses and mastoid air cells are clear.  Orbits are normal.  IMPRESSION: Normal head CT  CT CERVICAL SPINE  Findings: No prevertebral soft tissue swelling.  Normal alignment of cervical vertebral bodies.  No loss of vertebral body height. Normal facet articulation.  Normal craniocervical junction.  No evidence epidural or paraspinal hematoma.  IMPRESSION: No acute or significant chronic findings in the cervical spine.   Original Report Authenticated By: Genevive Bi, M.D.    Ct Cervical Spine Wo Contrast  01/29/2012  *RADIOLOGY REPORT*  Clinical Data:  Right-sided numbness and knee pain.  CT HEAD WITHOUT CONTRAST CT CERVICAL SPINE WITHOUT CONTRAST  Technique:  Multidetector CT imaging of the head and cervical spine was performed following the standard protocol without intravenous contrast.  Multiplanar CT image reconstructions of the cervical spine were also generated.  Comparison:   None  CT HEAD  Findings: No acute intracranial hemorrhage.  No focal mass lesion. No CT evidence of acute infarction.   No midline shift or mass effect.  No hydrocephalus.  Basilar cisterns are patent. Paranasal sinuses and mastoid air cells are clear.  Orbits are normal.  IMPRESSION: Normal head CT  CT CERVICAL SPINE  Findings: No prevertebral soft tissue swelling.  Normal alignment of cervical vertebral bodies.  No loss of vertebral body height. Normal facet articulation.  Normal craniocervical junction.  No evidence epidural or paraspinal hematoma.  IMPRESSION: No acute or significant chronic findings in the cervical spine.   Original Report Authenticated By: Genevive Bi, M.D.    Mr [facility] Wo Contrast  01/29/2012  *RADIOLOGY REPORT*  Clinical Data:  27 year old female with unexplained sudden onset right side numbness.  TIA versus demyelinating versus cervical myelopathy, etc.  Comparison:  CT  head and cervical spine 01/29/2012.  MRI HEAD WITHOUT AND WITH CONTRAST MRI CERVICAL SPINE WITHOUT AND WITH CONTRAST  Technique:  Multiplanar, multiecho pulse sequences of the brain and surrounding structures, and cervical spine, to include the craniocervical junction and cervicothoracic junction, were obtained without and with intravenous contrast.  Contrast: 13mL MULTIHANCE GADOBENATE DIMEGLUMINE 529 MG/ML IV SOLN,  MRI HEAD  Findings:  Incidental mild brachycephaly.  Normal cerebral volume. No restricted diffusion to suggest acute infarction.  No midline shift, mass effect, evidence of mass lesion, ventriculomegaly, extra-axial collection or acute intracranial hemorrhage. Cervicomedullary junction and pituitary are within normal limits. Major intracranial vascular flow voids are preserved.  Wallace Cullens and white matter signal is within normal limits throughout the brain.  No abnormal enhancement identified.  Cervical spine findings are below. Visualized bone marrow signal is within normal limits.  Visualized orbit soft tissues are within normal limits.  Visualized paranasal sinuses and mastoids are clear.  Normal scalp soft tissues.  IMPRESSION: 1. Normal MRI appearance of the brain. 2.  See MRA abnormal findings below. 3.  Cervical spine findings are below.  MRI CERVICAL SPINE  Findings: Straightening of cervical lordosis. No marrow edema or evidence of acute osseous abnormality.  Cervicomedullary junction is within normal limits.  Spinal cord signal is within normal limits at all visualized levels.  No abnormal enhancement identified.  Visualized paraspinal soft tissues are within normal limits.  C2-C3:  Negative.  C3-C4:  Negative.  C4-C5:  Negative.  C5-C6:  Minimal left uncovertebral hypertrophy.  No stenosis.  C6-C7:  Negative.  C7-T1:  Negative.  Negative visualized upper thoracic levels.  IMPRESSION: 1.  Normal MRI appearance of the cervical spine and spinal cord. 2.  See MRA abnormal findings below.  MRA HEAD  WITHOUT CONTRAST  Technique:  Angiographic images of the Circle of Willis were obtained using MRA technique without intravenous contrast.  Findings:  Antegrade flow in the posterior circulation.  Slightly dominant left vertebral artery.  Normal left PICA.  Diminutive right PICA.  Patent vertebrobasilar junction.  Mild basilar artery tortuosity without stenosis or irregularity.  SCA and PCA origins are normal.  Posterior communicating arteries are normal. Bilateral PCA branches are normal.  Antegrade flow in both ICA siphons.  No ICA stenosis.  Right ophthalmic and both posterior communicating artery origins are within normal limits.  The left cavernous ICA segment is tortuous, and there is a medially and inferiorly directed saccular lesion measuring 4 x 4 x 4 mm (series 7 image 82, series 703 image 11) most compatible with the superior hypophoseal region aneurysm.  This does not appear directly related to the nearby left ophthalmic artery origin.  Carotid termini are normal.  Left ACA A1 segment is dominant. Anterior communicating artery is diminutive.  Azygos type ACA appearance owing to dominance of the left ACA, but otherwise normal visualized ACA branches.  MCA origins are normal.  Bilateral M1 segments are within normal limits.  Bilateral MCA bifurcations are patent without stenosis. Visualized bilateral MCA branches are within normal limits.  No branch irregularity or occlusion identified.  IMPRESSION: 1. 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 2.  Otherwise negative anterior circulation.  Normal anatomic variation of the ACA branches. 3.  Negative posterior circulation.   Original Report Authenticated By: Erskine Speed, M.D.    Mr [facility] Wo Contrast  01/29/2012  *RADIOLOGY REPORT*  Clinical Data:  27 year old female with unexplained sudden onset right side numbness.  TIA versus demyelinating  versus cervical myelopathy, etc.  Comparison:  CT head and cervical spine 01/29/2012.  MRI  HEAD WITHOUT AND WITH CONTRAST MRI CERVICAL SPINE WITHOUT AND WITH CONTRAST  Technique:  Multiplanar, multiecho pulse sequences of the brain and surrounding structures, and cervical spine, to include the craniocervical junction and cervicothoracic junction, were obtained without and with intravenous contrast.  Contrast: 13mL MULTIHANCE GADOBENATE DIMEGLUMINE 529 MG/ML IV SOLN,  MRI HEAD  Findings:  Incidental mild brachycephaly.  Normal cerebral volume. No restricted diffusion to suggest acute infarction.  No midline shift, mass effect, evidence of mass lesion, ventriculomegaly, extra-axial collection or acute intracranial hemorrhage. Cervicomedullary junction and pituitary are within normal limits. Major intracranial vascular flow voids are preserved.  Wallace Cullens and white matter signal is within normal limits throughout the brain.  No abnormal enhancement identified.  Cervical spine findings are below. Visualized bone marrow signal is within normal limits.  Visualized orbit soft tissues are within normal limits.  Visualized paranasal sinuses and mastoids are clear.  Normal scalp soft tissues.  IMPRESSION: 1. Normal MRI appearance of the brain. 2.  See MRA abnormal findings below. 3.  Cervical spine findings are below.  MRI CERVICAL SPINE  Findings: Straightening of cervical lordosis. No marrow edema or evidence of acute osseous abnormality.  Cervicomedullary junction is within normal limits.  Spinal cord signal is within normal limits at all visualized levels.  No abnormal enhancement identified.  Visualized paraspinal soft tissues are within normal limits.  C2-C3:  Negative.  C3-C4:  Negative.  C4-C5:  Negative.  C5-C6:  Minimal left uncovertebral hypertrophy.  No stenosis.  C6-C7:  Negative.  C7-T1:  Negative.  Negative visualized upper thoracic levels.  IMPRESSION: 1.  Normal MRI appearance of the cervical spine and spinal cord. 2.  See MRA abnormal findings below.  MRA HEAD WITHOUT CONTRAST  Technique:  Angiographic  images of the Circle of Willis were obtained using MRA technique without intravenous contrast.  Findings:  Antegrade flow in the posterior circulation.  Slightly dominant left vertebral artery.  Normal left PICA.  Diminutive right PICA.  Patent vertebrobasilar junction.  Mild basilar artery tortuosity without stenosis or irregularity.  SCA and PCA origins are normal.  Posterior communicating arteries are normal. Bilateral PCA branches are normal.  Antegrade flow in both ICA siphons.  No ICA stenosis.  Right ophthalmic and both posterior communicating artery origins are within normal limits.  The left cavernous ICA segment is tortuous, and there is a medially and inferiorly directed saccular lesion measuring 4 x 4 x 4 mm (series 7 image 82, series 703 image 11) most compatible with the superior hypophoseal region aneurysm.  This does not appear directly related to the nearby left ophthalmic artery origin.  Carotid termini are normal.  Left ACA A1 segment is dominant. Anterior communicating artery is diminutive.  Azygos type ACA appearance owing to dominance of the left ACA, but otherwise normal visualized ACA branches.  MCA origins are normal.  Bilateral M1 segments are within normal limits.  Bilateral MCA bifurcations are patent without stenosis. Visualized bilateral MCA branches are within normal limits.  No branch irregularity or occlusion identified.  IMPRESSION: 1. 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 2.  Otherwise negative anterior circulation.  Normal anatomic variation of the ACA branches. 3.  Negative posterior circulation.   Original Report Authenticated By: Erskine Speed, M.D.    Mr Cervical Spine W Wo Contrast  01/29/2012  *RADIOLOGY REPORT*  Clinical Data:  27 year old female with  unexplained sudden onset right side numbness.  TIA versus demyelinating versus cervical myelopathy, etc.  Comparison:  CT head and cervical spine 01/29/2012.  MRI HEAD WITHOUT AND WITH CONTRAST  MRI CERVICAL SPINE WITHOUT AND WITH CONTRAST  Technique:  Multiplanar, multiecho pulse sequences of the brain and surrounding structures, and cervical spine, to include the craniocervical junction and cervicothoracic junction, were obtained without and with intravenous contrast.  Contrast: 13mL MULTIHANCE GADOBENATE DIMEGLUMINE 529 MG/ML IV SOLN,  MRI HEAD  Findings:  Incidental mild brachycephaly.  Normal cerebral volume. No restricted diffusion to suggest acute infarction.  No midline shift, mass effect, evidence of mass lesion, ventriculomegaly, extra-axial collection or acute intracranial hemorrhage. Cervicomedullary junction and pituitary are within normal limits. Major intracranial vascular flow voids are preserved.  Wallace Cullens and white matter signal is within normal limits throughout the brain.  No abnormal enhancement identified.  Cervical spine findings are below. Visualized bone marrow signal is within normal limits.  Visualized orbit soft tissues are within normal limits.  Visualized paranasal sinuses and mastoids are clear.  Normal scalp soft tissues.  IMPRESSION: 1. Normal MRI appearance of the brain. 2.  See MRA abnormal findings below. 3.  Cervical spine findings are below.  MRI CERVICAL SPINE  Findings: Straightening of cervical lordosis. No marrow edema or evidence of acute osseous abnormality.  Cervicomedullary junction is within normal limits.  Spinal cord signal is within normal limits at all visualized levels.  No abnormal enhancement identified.  Visualized paraspinal soft tissues are within normal limits.  C2-C3:  Negative.  C3-C4:  Negative.  C4-C5:  Negative.  C5-C6:  Minimal left uncovertebral hypertrophy.  No stenosis.  C6-C7:  Negative.  C7-T1:  Negative.  Negative visualized upper thoracic levels.  IMPRESSION: 1.  Normal MRI appearance of the cervical spine and spinal cord. 2.  See MRA abnormal findings below.  MRA HEAD WITHOUT CONTRAST  Technique:  Angiographic images of the Circle of Willis  were obtained using MRA technique without intravenous contrast.  Findings:  Antegrade flow in the posterior circulation.  Slightly dominant left vertebral artery.  Normal left PICA.  Diminutive right PICA.  Patent vertebrobasilar junction.  Mild basilar artery tortuosity without stenosis or irregularity.  SCA and PCA origins are normal.  Posterior communicating arteries are normal. Bilateral PCA branches are normal.  Antegrade flow in both ICA siphons.  No ICA stenosis.  Right ophthalmic and both posterior communicating artery origins are within normal limits.  The left cavernous ICA segment is tortuous, and there is a medially and inferiorly directed saccular lesion measuring 4 x 4 x 4 mm (series 7 image 82, series 703 image 11) most compatible with the superior hypophoseal region aneurysm.  This does not appear directly related to the nearby left ophthalmic artery origin.  Carotid termini are normal.  Left ACA A1 segment is dominant. Anterior communicating artery is diminutive.  Azygos type ACA appearance owing to dominance of the left ACA, but otherwise normal visualized ACA branches.  MCA origins are normal.  Bilateral M1 segments are within normal limits.  Bilateral MCA bifurcations are patent without stenosis. Visualized bilateral MCA branches are within normal limits.  No branch irregularity or occlusion identified.  IMPRESSION: 1. 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 2.  Otherwise negative anterior circulation.  Normal anatomic variation of the ACA branches. 3.  Negative posterior circulation.   Original Report Authenticated By: Erskine Speed, M.D.     @ROS @ Blood pressure 110/70, pulse 66, temperature  97.7 F (36.5 C), temperature source Oral, resp. rate 16, weight 66.044 kg (145 lb 9.6 oz), last menstrual period 01/27/2012, SpO2 100.00%. Patient is awake alert oriented x4 pupils are equal and reactive extraocular movements are intact cranial nerves are otherwise intact  strength is 5 out of 5 in her upper lower extremity no signs or pronator drift cerebellar exam is negative with no signs of dysmetria or dysdiadochokinesia. Ambulates well with no signs of ataxia  Assessment/Plan: 27 year female had episodic numbness and right arm and leg back of her right head it did not involve her face. 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 She is Admitted to medicine is being seen by neurology and worked up for stroke or TIA symptoms have completely resolved workup to include and show a 4 mm supraperiosteal aneurysm with no evidence of hemorrhage either radiographically on CT or historically. I think this is an incidental finding I do not think that this aneurysm is contributing to her symptoms I recommend continued workup per neurology for TIA/stroke and outpatient followup for the aneurysm with Dr. Franky Macho in one to 2 weeks. I extensively to explain this to the patient please arrange upon discharge.  Tanairy Payeur P 01/30/2012, 10:18 AM

## 2012-01-31 ENCOUNTER — Encounter (HOSPITAL_COMMUNITY): Payer: Self-pay

## 2012-01-31 ENCOUNTER — Encounter (HOSPITAL_COMMUNITY)
Admission: EM | Disposition: A | Discharge status: Home / Self Care | Payer: Self-pay | Source: Home / Self Care | Attending: Emergency Medicine

## 2012-01-31 ENCOUNTER — Observation Stay (HOSPITAL_COMMUNITY): Payer: BC Managed Care – PPO

## 2012-01-31 ENCOUNTER — Encounter: Payer: Self-pay | Admitting: Family Medicine

## 2012-01-31 DIAGNOSIS — G459 Transient cerebral ischemic attack, unspecified: Secondary | ICD-10-CM

## 2012-01-31 HISTORY — PX: TEE WITHOUT CARDIOVERSION: SHX5443

## 2012-01-31 LAB — LUPUS ANTICOAGULANT PANEL: DRVVT: 37.1 secs (ref <42.9)

## 2012-01-31 LAB — PROTEIN C, TOTAL: Protein C, Total: 82 % (ref 72–160)

## 2012-01-31 LAB — PROTEIN C ACTIVITY: Protein C Activity: 152 % — ABNORMAL HIGH (ref 75–133)

## 2012-01-31 SURGERY — ECHOCARDIOGRAM, TRANSESOPHAGEAL
Anesthesia: Moderate Sedation

## 2012-01-31 MED ORDER — SODIUM CHLORIDE 0.9 % IV SOLN
INTRAVENOUS | Status: DC
  Administered 2012-01-31: 500 mL via INTRAVENOUS

## 2012-01-31 MED ORDER — FENTANYL CITRATE 0.05 MG/ML IJ SOLN
INTRAMUSCULAR | Status: AC
  Filled 2012-01-31: qty 2

## 2012-01-31 MED ORDER — FENTANYL CITRATE 0.05 MG/ML IJ SOLN
INTRAMUSCULAR | Status: DC | PRN
  Administered 2012-01-31 (×2): 25 ug via INTRAVENOUS

## 2012-01-31 MED ORDER — ASPIRIN 325 MG PO TABS: 325.0000 mg | ORAL_TABLET | Freq: Every day | ORAL | Status: DC

## 2012-01-31 MED ORDER — MIDAZOLAM HCL 10 MG/2ML IJ SOLN
INTRAMUSCULAR | Status: DC | PRN
  Administered 2012-01-31 (×2): 2 mg via INTRAVENOUS

## 2012-01-31 MED ORDER — BUTAMBEN-TETRACAINE-BENZOCAINE 2-2-14 % EX AERO
INHALATION_SPRAY | CUTANEOUS | Status: DC | PRN
  Administered 2012-01-31 (×2): 1 via TOPICAL

## 2012-01-31 MED ORDER — MIDAZOLAM HCL 5 MG/ML IJ SOLN
INTRAMUSCULAR | Status: AC
  Filled 2012-01-31: qty 2

## 2012-01-31 NOTE — H&P (View-Only) (Signed)
PROGRESS NOTE  Shelby Cunningham MRN:[id number] DOB: 04/14/1985 DOA: 01/28/2012 PCP: No primary provider on file. sees [facility]-have informed family medicine service of this  Brief narrative: 27-year-old Hispanic female admitted at [place] 01/30/2012 and transferred to [facility] for concerns of TIA/stroke-she states she started having weakness in her right leg which progressed to numbness and strange altered feeling which has totally resolved at present  Past medical history-As per Problem list Chart reviewed as below- Nothing significant  Consultants:  Neurosurgery  Neurology  Cardiology   Procedures:  CT Head 1/12 IMPRESSION: Normal head CT  Ct head Cervical spine 1/12 No acute or significant chronic findings in the cervical spine.  MRA HEAD 1/121. 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 2. Otherwise negative anteriorcirculation. Normal anatomic variation of the ACA branches. 3. Negative posterior circulation.   MRI head 1/121. Normal MRI appearance of the cervical spine and spinal cord. 2. See MRA abnormal findings below.  Antibiotics:  None currently   Subjective  Doing well. No focal deficits. Ambulant. No chest pain no nausea no vomiting no shortness of breath. Denies any vertigo   Objective    Interim History: I did speak to Shelby Cunningham about her this am  Telemetry: Normal sinus rhythm, occasional bursts of tachycardia to the 130s   Objective: Filed Vitals:   01/29/12 2100 01/30/12 0500 01/30/12 0800 01/30/12 1245  BP: 124/77 130/69 110/70 123/61  Pulse: 69 61 66 67  Temp: 98.5 F (36.9 C) 98 F (36.7 C) 97.7 F (36.5 C) 97.7 F (36.5 C)  TempSrc:   Oral Oral  Resp: 16 16 16 16  Height:    5' 5" (1.651 m)  Weight:  66.044 kg (145 lb 9.6 oz)    SpO2: 97% 99% 100% 100%    Intake/Output Summary (Last 24 hours) at 01/30/12 1346 Last data filed at 01/30/12 1245  Gross per 24 hour  Intake    600 ml  Output     175 ml  Net    425 ml    Exam:  General: Alert pleasant oriented Hispanic female, significant acne  Cardiovascular: S1-S2 no murmur rub or gallop. Rhythm  Respiratory: Clinically clear  Abdomen: Soft nontender nondistended  Skin acne  Neuropower 5/5, reflexes 3/3 borderline brisk, no focal deficit, uvula midline, smile symmetric, power in shoulder girdle and with rotation of the head is normal, extraocular movements intact, vision by direct confrontation is intact  Data Reviewed: Basic Metabolic Panel:  Lab 01/29/12 0008  NA 141  K 3.5  CL 105  CO2 --  GLUCOSE 96  BUN 12  CREATININE 0.80  CALCIUM --  MG --  PHOS --   Liver Function Tests: No results found for this basename: AST:5,ALT:5,ALKPHOS:5,BILITOT:5,PROT:5,ALBUMIN:5 in the last 168 hours No results found for this basename: LIPASE:5,AMYLASE:5 in the last 168 hours No results found for this basename: AMMONIA:5 in the last 168 hours CBC:  Lab 01/29/12 0008 01/28/12 2345  WBC -- 7.7  NEUTROABS -- 3.9  HGB 15.0 14.4  HCT 44.0 41.6  MCV -- 81.1  PLT -- 283   Cardiac Enzymes: No results found for this basename: CKTOTAL:5,CKMB:5,CKMBINDEX:5,TROPONINI:5 in the last 168 hours BNP: No components found with this basename: POCBNP:5 CBG: No results found for this basename: GLUCAP:5 in the last 168 hours  No results found for this or any previous visit (from the past 240 hour(s)).   Studies:                All Imaging reviewed and is as per above notation   Scheduled Meds:   . aspirin  325 mg Oral Daily  . influenza  inactive virus vaccine  0.5 mL Intramuscular Tomorrow-1000   Continuous Infusions:    Assessment/Plan: 1. Potential TIA?-Workup per neurology-Hypercoagulable panel (except Factor V Leiden & Beta-2-glycoprotein, which are both associated with venous not arterial infarcts), Vasculitic labs (C3, C4, CH50, ESR, ANA) HIV & RPR ordered as per Neuro rec's-sedimentation rate is 11, ANA is abnormal but would  expect Sed rate to be elevated-will order DS dna-continue aspirin 325 mg-discontinued oral contraceptive pills-consider barrier methods versus copper T. and counseling for this an outpatient 2. 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 Greatly appreciate input-patient states that she has a young relative who had some type of brain malformation and died from this 3. Hyperlipidemia-LDL 118. 4. Impaired glucose tolerance test-A1c 5.4-monitor  Code Status: Full  Family Communication: Spoke with husband at bedside  Disposition Plan: As discussed with family medicine attending Shelby Cunningham-patient is a [facility] patient-patient to be taken over by them in am   Shelby Shelby Macht, MD  [facility] Pager [phone] 01/30/2012, 1:46 PM    LOS: 2 days      

## 2012-01-31 NOTE — Discharge Summary (Signed)
Family Medicine Teaching [facility] Discharge Summary  Patient name: Shelby Cunningham Medical record number: [id number] Date of birth: Jul 06, 1985 Age: 27 y.o. Gender: female Date of Admission: 01/28/2012  Date of Discharge: 01/31/2012 Admitting Physician: Therisa Doyne, MD  Primary Care Provider: No primary provider on file.  Indication for Hospitalization: Hemiplegia Discharge Diagnoses:  1. TIA 2. Hemiplegia 3. HLD 4. Aneurysm, intracranial   Consultations: Neurology, neurosurgery  Significant Labs and Imaging:   Lab 01/29/12 0008 01/28/12 2345  HGB 15.0 14.4  HCT 44.0 41.6  WBC -- 7.7  PLT -- 283    Lab 01/29/12 0008  NA 141  K 3.5  CL 105  CO2 --  GLUCOSE 96  BUN 12  CREATININE 0.80  CALCIUM --  MG --  PHOS --     Ref. Range 01/30/2012 14:22  Homocysteine Latest Range: 4.0-15.4 umol/L 6.4  PTT Lupus Anticoagulant Latest Range: 28.0-43.0 secs 34.5  DRVVT Latest Range: <42.9 secs 37.1  Lupus Anticoagulant Latest Range: NOT DETECTED  NOT DETECTED  AntiThromb III Func Latest Range: 75-120 % 113  Protein C Activity Latest Range: 75-133 % 152 (H)  Protein C, Total Latest Range: 72-160 % 82  Protein S Activity Latest Range: 69-129 % 63 (L)  Protein S Ag, Total Latest Range: 60-150 % 55 (L)  ds DNA Ab Latest Range: <30 IU/mL 2  RPR Latest Range: NON REACTIVE  NON REACTIVE  HIV Latest Range: NON REACTIVE  NON REACTIVE    CT Head and cervical spine W/O 01/29/2012  IMPRESSION:  Normal head CT, No acute or significant chronic findings in the cervical spine.   MR brain with and w/o brain 01/29/2012  IMPRESSION:  1. Normal MRI appearance of the brain.  2. See MRA abnormal findings below.  3. Cervical spine findings are below   MR/MRA head without 01/29/2012  IMPRESSION:  1. 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  2. Otherwise negative anterior circulation. Normal anatomic variation of the ACA branches.  3. Negative posterior  circulation.   MR Cervical spine With and w/o 01/29/2012  IMPRESSION:  1. Normal MRI appearance of the cervical spine and spinal cord.  TTE 01/30/2012  Impressions: - No cardiac source of emboli was indentified. Recommendations: Consider transesophageal echocardiography if clinically indicated.  Carotid Doppler 01/30/2012  Preliminary report: There is no ICA stenosis. Vertebral artery flow is antegrade.  TEE 01/31/2012 - Left ventricle: Systolic function was normal. The estimated ejection fraction was in the range of 55% to 65%. Wall motion was normal; there were no regional wall motion abnormalities. - Aortic valve: No evidence of vegetation. - Mitral valve: No evidence of vegetation. - Left atrium: No evidence of thrombus in the atrial cavity or appendage. - Atrial septum: No defect or patent foramen ovale was identified. Agitated saline contrast study showed no right-to-left atrial level shunt, at baseline or with provocation. - Tricuspid valve: No evidence of vegetation. - Pulmonic valve: No evidence of vegetation.  TTE 01/30/2012 - No cardiac source of emboli was indentified. Recommendations: Consider transesophageal echocardiography if clinically indicated.   Procedures:  MRi/MRA Brain and neck Transthoracic echo EEG Transesophageal Echo  Brief Hospital Course: Shelby Cunningham is a 27 y.o. year old female who presented with R sided numbness that progressed to involve her entire right side, right sided weakness, and dysarthria admitted for suspicion of TIA.  [facility] and transferred to our care when it was realized she was a  [place] urgent care primary patient.   TIA- Her symptoms resolved completely by the second day of admission. Neurology was consulted who reccommended a complete work up, daily aspirin, and cessation of the use of her OCPs. CT and MRI of her head and neck were uneventful. MRA of her head revealed a small  4X4X4 aneurysm described above. Neurosurgery was consulted who felt that it was not related to her symptoms and suggested OP f/u with Dr. Franky Macho. A TTE and Carotid dopplers were obtained that did not find any source of embolism. A TEE was obtained which definitively rulled out cardiac source and PFO.   Her LDL was found to be elevated and she was initially started on zocor. However this was discontinued before discharge as she has stopped her OCPs and has a reasonable chance of becoming pregnant without current contraception. A hypercoagulable and vasculitis work up was undergone as detailed below. Lastly an EEG was performed prior to discharge to r/o underlying seizure activity which was ending at dc. She was started on a daily aspirin.   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 Alphonzo Lemmings, Kristie Cowman th e patient is more concerned as she has young relative who died from a brain malformation   HLD  LDL found to be 118, zocor d/c'd as above. Due to concern for possibility of pregnancy.   Impaired glucose tolerance  A1C found to be 5.4, needs f/u OP with PCP  Dispo: home  Discharge Medications:    Medication List     As of 01/31/2012  6:29 PM    STOP taking these medications         Norgestimate-Ethinyl Estradiol Triphasic 0.18/0.215/0.25 MG-25 MCG tab      TAKE these medications         aspirin 325 MG tablet   Take 1 tablet (325 mg total) by mouth daily.      PAMPRIN MAX 250-250-65 MG per tablet   Generic drug:  aspirin-acetaminophen-caffeine   Take 2 tablets by mouth every 6 (six) hours as needed.       Issues for Follow Up:  Contraception- non hormonal and starting statin after that.   Outstanding Results: EEG, beta 2 glycoprotein, factor 5 leiden, anti-cardiolipin  Discharge Instructions: Please refer to Patient Instructions section of EMR for full details.  Patient was counseled important signs and symptoms that should prompt return to medical care, changes in medications, dietary instructions, activity restrictions, and follow up appointments.       Follow-up Information    Call CABBELL,KYLE L, MD.   Contact information:   [address]                         [address] [place] [address] [phone]       Follow up with DAUB, STEVE A, MD. (walk in for appointment in 1 week. )    Contact information:   [address] [place] [address] [phone]  Discharge Condition: Stable  Kevin Fenton, MD 01/31/2012, 6:29 PM

## 2012-01-31 NOTE — Progress Notes (Signed)
  Echocardiogram Echocardiogram Transesophageal has been performed.  Shelby Cunningham 01/31/2012, 1:01 PM

## 2012-01-31 NOTE — Progress Notes (Signed)
EEG completed.

## 2012-01-31 NOTE — CV Procedure (Signed)
See full TEE report in [employer]; normal LV function; negative saline microcavitation study (nondiagnostic late bubbles noted). Olga Millers

## 2012-01-31 NOTE — Progress Notes (Signed)
Spoke with Shelby Bayley, NP concerning patients EEG and discharge. She stated patient could be discharged without results. Notified Dr.Bradshaw. Emelda Brothers RN

## 2012-01-31 NOTE — Progress Notes (Signed)
Stroke Team Progress Note  HISTORY Shelby Cunningham is a 27 y.o. female who states that late last evening 01/28/2012, she noticed that her arm became numb. This began to improve over the course of about ten minutes and she went to lay down, then her entire right side went numb including the right back of the head, but sparing the face. The onset of both the events was sudden without spread. She denies headache or neck pain. This numbness resolved over approximately 2 hours. She is currently back to baseline. She does not have any medical problems, but does take OCPs. She also notes that for approximately 1 year, when working out, her right side feels "heavy."  She was admitted for further evaluation and treatment.  SUBJECTIVE Her husband is at the bedside.   OBJECTIVE Most recent Vital Signs: Filed Vitals:   01/30/12 2046 01/31/12 0000 01/31/12 0400 01/31/12 0800  BP: 111/72 102/67 105/71 103/68  Pulse: 65 60 62 66  Temp: 98 F (36.7 C) 97.8 F (36.6 C) 98.2 F (36.8 C) 98.2 F (36.8 C)  TempSrc: Oral Oral Oral   Resp: 15 16 16 16   Height:      Weight:      SpO2: 99% 100% 100% 97%   IV Fluid Intake:     . sodium chloride    . sodium chloride     MEDICATIONS    . aspirin  325 mg Oral Daily  . simvastatin  10 mg Oral q1800   PRN:  acetaminophen  Diet:  NPO for TEE Activity:   Bathroom privileges with assistance DVT Prophylaxis:  SCDs   CLINICALLY SIGNIFICANT STUDIES Basic Metabolic Panel:   Lab 01/29/12 0008  NA 141  K 3.5  CL 105  CO2 --  GLUCOSE 96  BUN 12  CREATININE 0.80  CALCIUM --  MG --  PHOS --   Liver Function Tests: No results found for this basename: AST:2,ALT:2,ALKPHOS:2,BILITOT:2,PROT:2,ALBUMIN:2 in the last 168 hours CBC:   Lab 01/29/12 0008 01/28/12 2345  WBC -- 7.7  NEUTROABS -- 3.9  HGB 15.0 14.4  HCT 44.0 41.6  MCV -- 81.1  PLT -- 283   Coagulation: No results found for this basename: LABPROT:4,INR:4 in the last 168 hours Cardiac Enzymes:  No results found for this basename: CKTOTAL:3,CKMB:3,CKMBINDEX:3,TROPONINI:3 in the last 168 hours Urinalysis: No results found for this basename: COLORURINE:2,APPERANCEUR:2,LABSPEC:2,PHURINE:2,GLUCOSEU:2,HGBUR:2,BILIRUBINUR:2,KETONESUR:2,PROTEINUR:2,UROBILINOGEN:2,NITRITE:2,LEUKOCYTESUR:2 in the last 168 hours  Lipid Panel     Component Value Date/Time   CHOL 190 01/30/2012 0755   TRIG 93 01/30/2012 0755   HDL 53 01/30/2012 0755   CHOLHDL 3.6 01/30/2012 0755   VLDL 19 01/30/2012 0755   LDLCALC 118* 01/30/2012 0755   HgbA1C  Lab Results  Component Value Date   HGBA1C 5.4 01/29/2012   Urine Drug Screen:   No results found for this basename: labopia,  cocainscrnur,  labbenz,  amphetmu,  thcu,  labbarb    Alcohol Level: No results found for this basename: ETH:2 in the last 168 hours  ANA positive  ESR normal  CT of the brain  01/29/2012   Normal head  Ct Cervical Spine Wo Contrast 01/29/2012  No acute or significant chronic findings in the cervical spine.   MRI of the brain  01/29/2012   Normal MRI appearance of the brain.  MRA of the brain  01/29/2012  1. 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 2.  Otherwise negative anterior circulation.  Normal anatomic variation of the ACA branches.  3.  Negative posterior circulation.     MRI Cervical Spine 01/29/2012   Normal MRI appearance of the cervical spine and spinal cord.  2D Echocardiogram  EF 55-60% with no source of embolus.   TEE    Carotid Doppler  No evidence of hemodynamically significant internal carotid artery stenosis. Vertebral artery flow is antegrade.   EKG  normal EKG, normal sinus rhythm.   EEG    Therapy Recommendations no therapy needs  Physical Exam   Young Hispanic lady currently not in distress. Face shows multiple acne.Awake alert. Afebrile. Head is nontraumatic. Neck is supple without bruit. Hearing is normal. Cardiac exam no murmur or gallop. Lungs are clear to auscultation. Distal  pulses are well felt.  Neurological Exam ; Awake  Alert oriented x 3. Normal speech and language.eye movements full without nystagmus. Fundi were not visualized. Vision acuity and fields appear normal Face symmetric. Tongue midline. Normal strength, tone, reflexes and coordination. Normal sensation. Gait deferred.   ASSESSMENT Ms. Shelby Cunningham is a 27 y.o. female presenting with right sided numbness. No acute stroke. Left brain TIA vs symptomatic L ICA hypophoseal aneurysm. Needs stroke workup in the young.  Work up underway. On no antiplatelts prior to admission. Now on aspirin 325 mg orally every day for secondary stroke prevention. Patient with resultant right sided numbness that is improving.   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 Dr. Wynetta Emery, neurosurgeon feels the aneurysm is an incidental finding Hyperlipidemia, LDL 118, not on statin PTA, now on low dose zocor, goal LDL < 100 OCP use, now stopped  Hospital day # 3  TREATMENT/PLAN  Continue aspirin 81 mg orally every day for secondary stroke prevention. TEE today to look for embolic source. Arranged with [facility]. If positive for PFO (patent foramen ovale), check bilateral lower extremity venous dopplers to rule out DVT as possible source of stroke. F/u hypercoagulable panel (except Factor V Leiden & Beta-2-glycoprotein, which are both associated with venous not arterial infarcts), Vasculitic labs (C3, C4, CH50, ESR, ANA) HIV & RPR Check EEG for possible sezire Interventional radiology consults as an OP - Dr. Pearlean Brownie will arrange. F/u dermatologist for acne Ok for discharge from neuro standpoint after TEE and EEG  Annie Main, MSN, RN, ANVP-BC, ANP-BC, [facility] Pager: [phone] 01/31/2012 10:56 AM  I have personally obtained a history, examined the patient, evaluated imaging results, and formulated  the assessment and plan of care. I agree with the above.  Delia Heady, MD Medical Director [facility] Pager: [phone] 01/31/2012 10:56 AM

## 2012-01-31 NOTE — Interval H&P Note (Signed)
History and Physical Interval Note:  01/31/2012 12:29 PM  Shelby Cunningham  has presented today for surgery, with the diagnosis of cva  The various methods of treatment have been discussed with the patient and family. After consideration of risks, benefits and other options for treatment, the patient has consented to  Procedure(s) (LRB) with comments: TRANSESOPHAGEAL ECHOCARDIOGRAM (TEE) (N/A) as a surgical intervention .  The patient's history has been reviewed, patient examined, no change in status, stable for surgery.  I have reviewed the patient's chart and labs.  Questions were answered to the patient's satisfaction.     Olga Millers

## 2012-02-01 ENCOUNTER — Encounter (HOSPITAL_COMMUNITY): Payer: Self-pay | Admitting: Cardiology

## 2012-02-01 LAB — CARDIOLIPIN ANTIBODIES, IGG, IGM, IGA
Anticardiolipin IgA: 6 APL U/mL — ABNORMAL LOW (ref <22)
Anticardiolipin IgG: 9 GPL U/mL — ABNORMAL LOW (ref <23)

## 2012-02-01 LAB — BETA-2-GLYCOPROTEIN I ABS, IGG/M/A: Beta-2-Glycoprotein I IgM: 13 M Units (ref <20)

## 2012-02-01 LAB — FACTOR 5 LEIDEN

## 2012-02-01 NOTE — Discharge Summary (Signed)
I have seen and examined this patient. I have discussed with Dr Bradshaw.  I agree with their findings and plans as documented in their discharge note.   

## 2012-02-01 NOTE — Procedures (Signed)
INDICATION FOR STUDY:  A 27 year old lady with transient numbness involving right arm and subsequently involving entire right side.  Study is being performed to rule out focal seizure disorder.  DESCRIPTION:  This is a routine EEG recording performed during wakefulness.  Predominant background activity consisted of 10 Hz symmetrical alpha rhythm, which attenuated well with eye opening. Photic stimulation produced a symmetrical occipital driving response. Hyperventilation produced a normal transient symmetrical generalized slowing response.  No epileptiform discharges were recorded.  There were no areas of abnormal slowing.  INTERPRETATION:  This is a normal EEG recording during wakefulness.  No evidence of an epileptic disorder was demonstrated.     Noel Christmas, MD    [id number] D:  02/01/2012 11:18:00  T:  02/01/2012 23:34:49  Job #:  [id number]

## 2012-02-01 NOTE — Progress Notes (Signed)
I discussed with  Dr Bradshaw.  I agree with their plans documented in their progress note for today.  

## 2012-03-03 ENCOUNTER — Other Ambulatory Visit: Payer: Self-pay

## 2012-11-22 ENCOUNTER — Other Ambulatory Visit: Payer: Self-pay

## 2013-01-29 ENCOUNTER — Ambulatory Visit: Payer: BC Managed Care – PPO | Admitting: Family Medicine

## 2013-01-29 ENCOUNTER — Ambulatory Visit: Payer: BC Managed Care – PPO

## 2013-01-29 VITALS — BP 110/72 | HR 72 | Temp 98.8°F | Resp 16 | Ht 65.0 in | Wt 140.0 lb

## 2013-01-29 DIAGNOSIS — M25569 Pain in unspecified knee: Secondary | ICD-10-CM

## 2013-01-29 DIAGNOSIS — M25519 Pain in unspecified shoulder: Secondary | ICD-10-CM

## 2013-01-29 DIAGNOSIS — G459 Transient cerebral ischemic attack, unspecified: Secondary | ICD-10-CM

## 2013-01-29 LAB — POCT GLYCOSYLATED HEMOGLOBIN (HGB A1C): Hemoglobin A1C: 4.9

## 2013-01-29 LAB — LDL CHOLESTEROL, DIRECT: Direct LDL: 90 mg/dL

## 2013-01-29 NOTE — Progress Notes (Signed)
[facility] [address] [place] [address] [phone]  Date:  01/29/2013   Name:  Shelby Cunningham   DOB:  12/16/1985   MRN:  [id number]  PCP:  No primary provider on file.    Chief Complaint: Arm Pain and Knee Pain   History of Present Illness:  Shelby Cunningham is a 28 y.o. very pleasant female patient who presents with the following:  She was admitted a year ago with a possible TIA and hemiplegia, and a tiny intracranial aneurysm.  Per her report her neurologist was not sure if she really had a TIA or not.    She is here today with right arm discomfort/ pain.  She notes "stiffness, heaviness" of the right side of her body; in her right shoulder and right knee.  She has noted this for about 6 months.  When she was in the hospital she had numbness including her head on her right side so she wanted to be sure she is ok.  However she does not have any numbness this time.  In fact it seems to be more of an ache and is worse after she exercises.  It seems that the focus of her right arm symptoms is her shoulder.  She does work out at [employer] which can cause pain.  Overhead press is especially bothersome.   She also has noted pain in her right knee- especially with exercise.  She has noted more pain for about 3 months- however she has noted discomfort in her knee for one year.  She is ok with just walking, but running is painful.  Cardio, squats bother her knee.  She is ok with stairs.    She was supposed to see Dr. Christella Noa in December but was not able to go because her insurance changed.  However she does plan to see him soon as she has insurance again  Besides her possible TIA she is generally healthy   She would like to check her cholesterol and glucose again today as well.  She is not fasting today.    She has not noted any headaches.    Patient Active Problem List   Diagnosis Date Noted  . TIA (transient ischemic attack) 01/29/2012  . Hemiplegia affecting right  dominant side 01/29/2012    Past Medical History  Diagnosis Date  . No pertinent past medical history   . Exercise-induced shortness of breath     "sometimes at the gym" (01/30/2012)  . TIA (transient ischemic attack) 01/14    Past Surgical History  Procedure Laterality Date  . No past surgeries    . Tee without cardioversion  01/31/2012    Procedure: TRANSESOPHAGEAL ECHOCARDIOGRAM (TEE);  Surgeon: Lelon Perla, MD;  [facility] ENDOSCOPY;  Service: Cardiovascular;  Laterality: N/A;    History  Substance Use Topics  . Smoking status: Never Smoker   . Smokeless tobacco: Never Used  . Alcohol Use: 0.6 oz/week    1 Shots of liquor per week     Comment: 01/30/2012 "3 drinks a month"    Family History  Problem Relation Age of Onset  . Diabetes Mother     No Known Allergies  Medication list has been reviewed and updated.  Current Outpatient Prescriptions on File Prior to Visit  Medication Sig Dispense Refill  . aspirin 325 MG tablet Take 1 tablet (325 mg total) by mouth daily.  30 tablet  11  . aspirin-acetaminophen-caffeine (PAMPRIN MAX) 284-132-44 MG per tablet  Take 2 tablets by mouth every 6 (six) hours as needed.       No current facility-administered medications on file prior to visit.    Review of Systems:  As per HPI- otherwise negative.   Physical Examination: Filed Vitals:   01/29/13 1216  BP: 110/72  Pulse: 72  Temp: 98.8 F (37.1 C)  Resp: 16   Filed Vitals:   01/29/13 1216  Height: 5\' 5"  (1.651 m)  Weight: 140 lb (63.504 kg)   Body mass index is 23.3 kg/(m^2). Ideal Body Weight: Weight in (lb) to have BMI = 25: 149.9  GEN: WDWN, NAD, Non-toxic, A & O x 3, looks well HEENT: Atraumatic, Normocephalic. Neck supple. No masses, No LAD.  Bilateral TM wnl, oropharynx normal.  PEERL,EOMI.   Ears and Nose: No external deformity. CV: RRR, No M/G/R. No JVD. No thrill. No extra heart sounds. PULM: CTA B, no wheezes, crackles, rhonchi. No retractions.  No resp. distress. No accessory muscle use. ABD: S, NT, ND. No rebound. No HSM. EXTR: No c/c/e NEURO Normal gait. Complete neuro exam is normal including strength, sensation and DTR in all extremities,  Normal movement and sensation of face. Normal balance.   PSYCH: Normally interactive. Conversant. Not depressed or anxious appearing.  Calm demeanor. Her right shoulder has full ROM in all aspects but she has some discomfort with external rotation and tenderness over the anterior insertion of the RC tendon Right knee is tender over the anterior patella, and with pressure on the patella.  Knee is stable, no redness, effusion or heat.   Full ROM but some discomfort with flexion.   [facility] reading (PRIMARY) by  Dr. Lorelei Pont. Right shoulder: negative Right knee:negative  RIGHT KNEE - 1-2 VIEW  COMPARISON: None.  FINDINGS: Frontal and lateral views were obtained. There is no fracture, dislocation, or effusion. Joint spaces appear intact. No erosive change.  IMPRESSION: No abnormality noted.  RIGHT SHOULDER - 2+ VIEW  COMPARISON: None.  FINDINGS: There is no evidence of fracture or dislocation. Visualized ribs appear normal. There is no evidence of arthropathy or other focal bone abnormality. Soft tissues are unremarkable.  IMPRESSION: Normal right shoulder.   Results for orders placed in visit on 01/29/13  POCT GLYCOSYLATED HEMOGLOBIN (HGB A1C)      Result Value Range   Hemoglobin A1C 4.9      Assessment and Plan: Knee pain - Plan: DG Knee 1-2 Views Right, Ambulatory referral to Physical Therapy  Shoulder pain - Plan: DG Shoulder Right, Ambulatory referral to Physical Therapy  TIA (transient ischemic attack) - Plan: LDL Cholesterol, Direct, POCT glycosylated hemoglobin (Hb A1C)  Rayna is here today with various aches on the right side of her body.  She is understandably concerned because of her possible history of TIA last year.  However at this time it seems that her issues  are MSK.  Will refer her to PT to work on her right shoulder and right patellofemoral pain issues.  Demonstrated internal and external rotation exercises with band for her shoulder.  Called [facility] and they will get in touch with her regarding follow-up.    If any changes in her symptoms or other concerns she is to seek care right away   Signed Lamar Blinks, MD

## 2013-01-29 NOTE — Patient Instructions (Signed)
I think that you likely have a right rotator cuff strain and right patellofemoral pain.  Avoid doing any overhead lifting (especially overhead press at the gym) and work on the exercises I showed you for your shoulder.  For your knee, avoid any repetitive bending such as squats or lunges.  We will refer you to PT to work on your knee and shoulder pain more.  Patellofemoral Pain Your exam shows your knee pain is probably due to a problem with the knee cap, the patella. This problem is also called patellofemoral pain, runner's knee, or chondromalacia. Most of the time, this problem is due to overuse of the knee joint. Repeated bending and straightening can irritate the underside of the knee cap. When this happens, activities such as running, walking, climbing, biking or jumping usually produce pain. Pain may also occur after prolonged sitting. Other patellofemoral symptoms can include joint stiffness, swelling, and a snapping or grinding sensation with movement. Rest and rehabilitation are usually successful in treating this problem. Surgery is rarely needed. Treatment includes correcting any mechanical factors that could hurt the normal working of the knee. This could be weak thigh muscles or foot problems. Avoid repetitive activities of the knee until the pain and other symptoms improve. Apply ice packs over the knee for 20 to 30 minutes every 2 to 4 hours to reduce pain and swelling. Only take over-the-counter or prescription medicines for pain, discomfort, or fever as directed by your caregiver. Knee braces or neoprene sleeves may help reduce irritation. Rehabilitation exercises to strengthen the quad muscle are often prescribed when your symptoms are better. Call your caregiver for a follow-up exam to evaluate your response to treatment. Document Released: 02/11/2004 Document Revised: 03/28/2011 Document Reviewed: 01/03/2005 [employer] Patient Information 2014 [place].

## 2013-02-04 ENCOUNTER — Other Ambulatory Visit: Payer: Self-pay | Admitting: Neurosurgery

## 2013-02-04 DIAGNOSIS — I671 Cerebral aneurysm, nonruptured: Secondary | ICD-10-CM

## 2013-02-11 ENCOUNTER — Ambulatory Visit
Admission: RE | Admit: 2013-02-11 | Discharge: 2013-02-11 | Disposition: A | Discharge status: Home / Self Care | Payer: BC Managed Care – PPO | Source: Ambulatory Visit | Attending: Neurosurgery | Admitting: Neurosurgery

## 2013-02-11 ENCOUNTER — Other Ambulatory Visit: Payer: Self-pay | Admitting: Neurosurgery

## 2013-02-11 DIAGNOSIS — I671 Cerebral aneurysm, nonruptured: Secondary | ICD-10-CM

## 2013-02-11 MED ORDER — IOHEXOL 350 MG/ML SOLN
100.0000 mL | Freq: Once | INTRAVENOUS | Status: AC | PRN
  Administered 2013-02-11: 100 mL via INTRAVENOUS

## 2013-02-13 ENCOUNTER — Ambulatory Visit
Admission: RE | Admit: 2013-02-13 | Discharge: 2013-02-13 | Disposition: A | Discharge status: Home / Self Care | Payer: BC Managed Care – PPO | Source: Ambulatory Visit | Attending: Neurosurgery | Admitting: Neurosurgery

## 2013-02-13 DIAGNOSIS — I671 Cerebral aneurysm, nonruptured: Secondary | ICD-10-CM

## 2013-02-13 MED ORDER — IOHEXOL 350 MG/ML SOLN
100.0000 mL | Freq: Once | INTRAVENOUS | Status: AC | PRN
  Administered 2013-02-13: 100 mL via INTRAVENOUS

## 2015-07-08 ENCOUNTER — Ambulatory Visit: Payer: Self-pay | Admitting: Certified Nurse Midwife

## 2016-01-18 NOTE — L&D Delivery Note (Signed)
Operative Delivery Note Complete since 11.25 am but didn't push much due to high station. Pushed for about 2 hrs with good progress to station +2 but station moving back in between pushes to +1, reviewed vacuum agrees.   Verbal consent: obtained from patient.  Risks and benefits discussed in detail.  Risks include, but are not limited to the risks of anesthesia, bleeding, infection, damage to maternal tissues, fetal cephalhematoma.  There is also the risk of inability to effect vaginal delivery of the head, or shoulder dystocia that cannot be resolved by established maneuvers, leading to the need for emergency cesarean section.  One vacuum application with Kiwi was pop off due to hair. 2nd application done, 3 pushes with vacuum release in b/w and with the 4th push head delivery achieved.   At 4:24 PM a viable and healthy female was delivered via Vaginal, Vacuum Neurosurgeon).  Presentation: vertex; Position: Occiput, posterior. Station: +2. Rest of the delivery was smooth in one motion. Baby placed on mother's abdomen.  Delayed cord clamping at 1 minute.   APGAR: 8, 9; weight -pending    Placenta status: spontaneous and complete.    Cord:  with the following complications: none except known 2 vessel cord.  Cord pH: sent   Anesthesia:  Epidural and local 1% plain lidocaine infiltration  Instruments: Kiwi vacuum  Episiotomy: None Lacerations: 2nd degree perineal and left labial with slight hematoma but pressure help reduce it.  Suture Repair: 3.0 vicryl rapide Est. Blood Loss (mL):  400 cc  Mom to postpartum.  Baby to Couplet care / Skin to Skin.  Shelby Cunningham R 11/29/2016, 5:00 PM

## 2016-05-05 LAB — OB RESULTS CONSOLE ABO/RH: RH TYPE: POSITIVE

## 2016-05-05 LAB — OB RESULTS CONSOLE HIV ANTIBODY (ROUTINE TESTING): HIV: NONREACTIVE

## 2016-05-05 LAB — OB RESULTS CONSOLE HEPATITIS B SURFACE ANTIGEN: Hepatitis B Surface Ag: NEGATIVE

## 2016-05-05 LAB — OB RESULTS CONSOLE ANTIBODY SCREEN: Antibody Screen: NEGATIVE

## 2016-05-05 LAB — OB RESULTS CONSOLE RUBELLA ANTIBODY, IGM: RUBELLA: IMMUNE

## 2016-05-05 LAB — OB RESULTS CONSOLE RPR: RPR: NONREACTIVE

## 2016-05-13 LAB — OB RESULTS CONSOLE GC/CHLAMYDIA
Chlamydia: NEGATIVE
GC PROBE AMP, GENITAL: NEGATIVE

## 2016-11-09 LAB — OB RESULTS CONSOLE GBS: STREP GROUP B AG: POSITIVE

## 2016-11-28 ENCOUNTER — Encounter (HOSPITAL_COMMUNITY): Payer: Self-pay | Admitting: Certified Registered Nurse Anesthetist

## 2016-11-28 ENCOUNTER — Other Ambulatory Visit: Payer: Self-pay

## 2016-11-28 ENCOUNTER — Encounter (HOSPITAL_COMMUNITY): Payer: Self-pay | Admitting: *Deleted

## 2016-11-28 ENCOUNTER — Inpatient Hospital Stay (HOSPITAL_COMMUNITY)
Admission: AD | Admit: 2016-11-28 | Discharge: 2016-12-01 | DRG: 806 | Disposition: A | Discharge status: Home / Self Care | Payer: Medicaid Other | Source: Ambulatory Visit | Attending: Obstetrics & Gynecology | Admitting: Obstetrics & Gynecology

## 2016-11-28 DIAGNOSIS — Z8673 Personal history of transient ischemic attack (TIA), and cerebral infarction without residual deficits: Secondary | ICD-10-CM | POA: Diagnosis not present

## 2016-11-28 DIAGNOSIS — R338 Other retention of urine: Secondary | ICD-10-CM | POA: Diagnosis not present

## 2016-11-28 DIAGNOSIS — O99824 Streptococcus B carrier state complicating childbirth: Secondary | ICD-10-CM | POA: Diagnosis present

## 2016-11-28 DIAGNOSIS — O4290 Premature rupture of membranes, unspecified as to length of time between rupture and onset of labor, unspecified weeks of gestation: Secondary | ICD-10-CM | POA: Diagnosis present

## 2016-11-28 DIAGNOSIS — Z8759 Personal history of other complications of pregnancy, childbirth and the puerperium: Secondary | ICD-10-CM

## 2016-11-28 DIAGNOSIS — R339 Retention of urine, unspecified: Secondary | ICD-10-CM | POA: Diagnosis not present

## 2016-11-28 DIAGNOSIS — Z3A38 38 weeks gestation of pregnancy: Secondary | ICD-10-CM

## 2016-11-28 DIAGNOSIS — O4292 Full-term premature rupture of membranes, unspecified as to length of time between rupture and onset of labor: Principal | ICD-10-CM | POA: Diagnosis present

## 2016-11-28 DIAGNOSIS — Z3400 Encounter for supervision of normal first pregnancy, unspecified trimester: Secondary | ICD-10-CM

## 2016-11-28 HISTORY — DX: Personal history of other complications of pregnancy, childbirth and the puerperium: Z87.59

## 2016-11-28 HISTORY — DX: Aneurysm of unspecified site: I72.9

## 2016-11-28 LAB — CBC
HCT: 38.4 % (ref 36.0–46.0)
Hemoglobin: 13 g/dL (ref 12.0–15.0)
MCH: 27.8 pg (ref 26.0–34.0)
MCHC: 33.9 g/dL (ref 30.0–36.0)
MCV: 82.2 fL (ref 78.0–100.0)
PLATELETS: 261 10*3/uL (ref 150–400)
RBC: 4.67 MIL/uL (ref 3.87–5.11)
RDW: 13.8 % (ref 11.5–15.5)
WBC: 12.9 10*3/uL — ABNORMAL HIGH (ref 4.0–10.5)

## 2016-11-28 LAB — URINALYSIS, ROUTINE W REFLEX MICROSCOPIC
BILIRUBIN URINE: NEGATIVE
Glucose, UA: NEGATIVE mg/dL
HGB URINE DIPSTICK: NEGATIVE
KETONES UR: NEGATIVE mg/dL
Leukocytes, UA: NEGATIVE
NITRITE: NEGATIVE
Protein, ur: NEGATIVE mg/dL
Specific Gravity, Urine: 1.024 (ref 1.005–1.030)
pH: 6 (ref 5.0–8.0)

## 2016-11-28 LAB — TYPE AND SCREEN
ABO/RH(D): O POS
ANTIBODY SCREEN: NEGATIVE

## 2016-11-28 LAB — ABO/RH: ABO/RH(D): O POS

## 2016-11-28 MED ORDER — OXYTOCIN BOLUS FROM INFUSION: 500.0000 mL | Freq: Once | INTRAVENOUS | Status: DC

## 2016-11-28 MED ORDER — PENICILLIN G POTASSIUM 5000000 UNITS IJ SOLR
5.0000 10*6.[IU] | Freq: Once | INTRAVENOUS | Status: AC
  Administered 2016-11-28: 5 10*6.[IU] via INTRAVENOUS
  Filled 2016-11-28: qty 5

## 2016-11-28 MED ORDER — OXYTOCIN 40 UNITS IN LACTATED RINGERS INFUSION - SIMPLE MED: 2.5000 [IU]/h | INTRAVENOUS | Status: DC

## 2016-11-28 MED ORDER — OXYTOCIN BOLUS FROM INFUSION
500.0000 mL | Freq: Once | INTRAVENOUS | Status: AC
  Administered 2016-11-29: 500 mL via INTRAVENOUS

## 2016-11-28 MED ORDER — LACTATED RINGERS IV SOLN: INTRAVENOUS | Status: DC

## 2016-11-28 MED ORDER — LACTATED RINGERS IV SOLN
INTRAVENOUS | Status: DC
  Administered 2016-11-28 (×3): via INTRAVENOUS

## 2016-11-28 MED ORDER — OXYCODONE-ACETAMINOPHEN 5-325 MG PO TABS: 1.0000 | ORAL_TABLET | ORAL | Status: DC | PRN

## 2016-11-28 MED ORDER — ONDANSETRON HCL 4 MG/2ML IJ SOLN: 4.0000 mg | Freq: Four times a day (QID) | INTRAMUSCULAR | Status: DC | PRN

## 2016-11-28 MED ORDER — MISOPROSTOL 25 MCG QUARTER TABLET
25.0000 ug | ORAL_TABLET | ORAL | Status: AC | PRN
  Administered 2016-11-28 (×2): 25 ug via VAGINAL
  Filled 2016-11-28 (×2): qty 1

## 2016-11-28 MED ORDER — LACTATED RINGERS IV SOLN: 500.0000 mL | INTRAVENOUS | Status: DC | PRN

## 2016-11-28 MED ORDER — TERBUTALINE SULFATE 1 MG/ML IJ SOLN
0.2500 mg | Freq: Once | INTRAMUSCULAR | Status: DC | PRN
Start: 2016-11-28 — End: 2016-11-29
  Filled 2016-11-28: qty 1

## 2016-11-28 MED ORDER — SOD CITRATE-CITRIC ACID 500-334 MG/5ML PO SOLN: 30.0000 mL | ORAL | Status: DC | PRN

## 2016-11-28 MED ORDER — LACTATED RINGERS IV SOLN
500.0000 mL | INTRAVENOUS | Status: DC | PRN
  Administered 2016-11-29: 300 mL via INTRAVENOUS

## 2016-11-28 MED ORDER — ACETAMINOPHEN 325 MG PO TABS: 650.0000 mg | ORAL_TABLET | ORAL | Status: DC | PRN

## 2016-11-28 MED ORDER — ONDANSETRON HCL 4 MG/2ML IJ SOLN
4.0000 mg | Freq: Four times a day (QID) | INTRAMUSCULAR | Status: DC | PRN
  Administered 2016-11-29: 4 mg via INTRAVENOUS
  Filled 2016-11-28 (×2): qty 2

## 2016-11-28 MED ORDER — OXYCODONE-ACETAMINOPHEN 5-325 MG PO TABS: 2.0000 | ORAL_TABLET | ORAL | Status: DC | PRN

## 2016-11-28 MED ORDER — FLEET ENEMA 7-19 GM/118ML RE ENEM: 1.0000 | ENEMA | RECTAL | Status: DC | PRN

## 2016-11-28 MED ORDER — PENICILLIN G POT IN DEXTROSE 60000 UNIT/ML IV SOLN
3.0000 10*6.[IU] | INTRAVENOUS | Status: DC
  Administered 2016-11-28 – 2016-11-29 (×4): 3 10*6.[IU] via INTRAVENOUS
  Filled 2016-11-28 (×8): qty 50

## 2016-11-28 MED ORDER — OXYTOCIN 40 UNITS IN LACTATED RINGERS INFUSION - SIMPLE MED
1.0000 m[IU]/min | INTRAVENOUS | Status: DC
  Administered 2016-11-29: 2 m[IU]/min via INTRAVENOUS
  Filled 2016-11-28: qty 1000

## 2016-11-28 MED ORDER — LIDOCAINE HCL (PF) 1 % IJ SOLN
30.0000 mL | INTRAMUSCULAR | Status: DC | PRN
  Administered 2016-11-29: 30 mL via SUBCUTANEOUS
  Filled 2016-11-28 (×2): qty 30

## 2016-11-28 MED ORDER — LIDOCAINE HCL (PF) 1 % IJ SOLN
30.0000 mL | INTRAMUSCULAR | Status: DC | PRN
  Filled 2016-11-28: qty 30

## 2016-11-28 NOTE — H&P (Signed)
Shelby Cunningham is a 31 y.o. G1P0 at [redacted]w[redacted]d presenting for SROM. Pt notes no contractions . Good fetal movement, No vaginal bleeding, started leaking fluid at 930am.  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 Pt has regular neurology f/u and dilation has been stable per pt. Pt had visit during preg and per pt's report has no contraindications for pushing. No mention of this f/u in her OB record. - 2VC, nl fetal echo - declined genetic screening - GBS positive - Dated by 6 wk u/s   Prenatal Transfer Tool  Maternal Diabetes: No Genetic Screening: Declined Maternal Ultrasounds/Referrals: Normal Fetal Ultrasounds or other Referrals:  Fetal echo Maternal Substance Abuse:  No Significant Maternal Medications:  None Significant Maternal Lab Results: None     OB History    Gravida Para Term Preterm AB Living   1             SAB TAB Ectopic Multiple Live Births                 Past Medical History:  Diagnosis Date  . Aneurysm ([place])    Brain - has checked every 6 months  . Exercise-induced shortness of breath    "sometimes at the gym" (01/30/2012)  . No pertinent past medical history   . TIA (transient ischemic attack) 01/14   Past Surgical History:  Procedure Laterality Date  . NO PAST SURGERIES     Family History: family history includes Diabetes in her mother and paternal uncle. Social History:  reports that  has never smoked. she has never used smokeless tobacco. She reports that she drinks about 0.6 oz of alcohol per week. She reports that she does not use drugs.  Review of Systems - Negative except leaking fluid     Blood pressure 138/85, pulse 86, temperature 98.1 F (36.7 C), temperature source Oral, resp. rate 18, weight 95.3 kg (210 lb 1.9 oz), SpO2 99 %.  Physical Exam:  Gen: well appearing, no distress  Back: no CVAT Abd: gravid, NT, no RUQ pain LE: no edema, equal bilaterally, non-tender Toco:  none FH: baseline 120s, accelerations present, no deceleratons, 10 beat variability  Sterile speculum exam by provider in MAU: gross rupture, meconium present, cvx closed  RTUS. Bedside- vtx   Prenatal labs: ABO, Rh: O/Positive/-- (04/19 0000) Antibody: Negative (04/19 0000) Rubella: Immune (04/19 0000) RPR: Nonreactive (04/19 0000)  HBsAg: Negative (04/19 0000)  HIV: Non-reactive (04/19 0000)  GBS: Positive (10/24 0000)  1 hr Glucola 76  Genetic screening not done Anatomy US normal   Assessment/Plan: 31 y.o. G1P0 at [redacted]w[redacted]d SROM, not in active labor. Plan cytotec x 2 then pitocin. Given no room on labor floor will move pt to antepartum and start cytotec there.  Reactive fetal testing GBS pos, start PCN   Shelby Cunningham A. 11/28/2016, 3:11 PM

## 2016-11-28 NOTE — MAU Provider Note (Signed)
S:  Shelby Cunningham is a 31 y.o. female G1P0  @ [redacted]w[redacted]d here in MAU with complaints of leaking of fluid since this morning. She states she has been leaking greenish fluid. + fetal movement. No bleeding. States she is receiving her prenatal care with Dr. Garwin Brothers at [facility]. Her last visit was 1 week ago. She had to cancel her appointment this week due to a change in her insurance. States she was worried about not being able to pay for her bill if she went to her appointment.    O:  Vitals:   11/28/16 1151 11/28/16 1253  BP: 130/80 138/85  Pulse: 82 86  Resp: 16 18  Temp: 98.1 F (36.7 C) 98.1 F (36.7 C)  SpO2: 100% 99%   GENERAL: Well-developed, well-nourished female in no acute distress.  LUNGS: Effort normal SKIN: Warm, dry and without erythema PSYCH: Normal mood and affect PELVIC:  Vagina - Moderate amount of green fluid pooling in the posterior vagina.  Bimanual exam: Deferred  Chaperone present for exam.  MDM:  RN to notify Dr. Garwin Brothers  A:  PROM with meconium stained fluid  P:  Anticipate admission to birthing suits   Rasch, Artist Pais, NP 11/28/2016 1:35 PM

## 2016-11-28 NOTE — Progress Notes (Signed)
25 mcg cytotec placed via vagina per order. FHR stable 125 with accels. CTX 6-10 minutes apart. Pain rates pain @0 /10. Will continue to monitor. Toya Smothers, RN

## 2016-11-28 NOTE — Anesthesia Pain Management Evaluation Note (Signed)
  CRNA Pain Management Visit Note  Patient: Shelby Cunningham, 31 y.o., female  "Hello I am a member of the anesthesia team at [facility]. We have an anesthesia team available at all times to provide care throughout the hospital, including epidural management and anesthesia for C-section. I don't know your plan for the delivery whether it a natural birth, water birth, IV sedation, nitrous supplementation, doula or epidural, but we want to meet your pain goals."   1.Was your pain managed to your expectations on prior hospitalizations?   No prior hospitalizations  2.What is your expectation for pain management during this hospitalization?     Labor support without medications, Epidural and IV pain meds  3.How can we help you reach that goal? Patient is aware of all pain control options and will determine plan as labor progresses.  Record the patient's initial score and the patient's pain goal.   Pain: 7/10 during contractions; no pain in between contractions  Pain Goal: 8 [facility] wants you to be able to say your pain was always managed very well.  Shatha Hooser L 11/28/2016

## 2016-11-28 NOTE — MAU Note (Signed)
Pt presents with c/o green/yellow discharge that began this morning @ 0930.  Denies VB.  Reports +FM.

## 2016-11-28 NOTE — Progress Notes (Signed)
S: Pt notes not feeling any ctx, continues with small LOF, no VB, active FM. Pt denies fevers or pain  O: Vitals:   11/28/16 1151 11/28/16 1253 11/28/16 1525 11/28/16 1530  BP: 130/80 138/85 (!) 146/75 136/82  Pulse: 82 86 87   Resp: 16 18 18    Temp: 98.1 F (36.7 C) 98.1 F (36.7 C) 98.4 F (36.9 C)   TempSrc: Oral Oral Oral   SpO2: 100% 99% 98%   Weight: 95.3 kg (210 lb 1.9 oz)  95.3 kg (210 lb 1.9 oz)   Height:   5\' 6"  (1.676 m)    Abd: gravid, no fundal tenderness LE: NT, no edema GU: def Toco: none FH: 130s, + accels, no decels, 10 beat var  CBC    Component Value Date/Time   WBC 12.9 (H) 11/28/2016 1500   RBC 4.67 11/28/2016 1500   HGB 13.0 11/28/2016 1500   HCT 38.4 11/28/2016 1500   PLT 261 11/28/2016 1500   MCV 82.2 11/28/2016 1500   MCH 27.8 11/28/2016 1500   MCHC 33.9 11/28/2016 1500   RDW 13.8 11/28/2016 1500   LYMPHSABS 2.9 01/28/2012 2345   MONOABS 0.6 01/28/2012 2345   EOSABS 0.2 01/28/2012 2345   BASOSABS 0.1 01/28/2012 2345    A/P: 31 yo G1 at 38'4 with ROM and not in active labor. S/p cytotec x 1, no contractions, still awaiting room on labor an delivery. Will proceed with cytotec #2 then plan pitocin. - GBS pos. On PCN -reactive fetal testing.  Carlye Panameno A. 11/28/2016 7:06 PM

## 2016-11-28 NOTE — MAU Note (Signed)
+  green and yellow vaginal discharge Just noticed this morning and states it has continued to come out  +contractions Reports every 10 minutes apart Rating pain 7/10   Denies vaginal bleeding  +FM

## 2016-11-29 ENCOUNTER — Inpatient Hospital Stay (HOSPITAL_COMMUNITY): Payer: Medicaid Other | Admitting: Anesthesiology

## 2016-11-29 ENCOUNTER — Encounter (HOSPITAL_COMMUNITY): Payer: Self-pay

## 2016-11-29 DIAGNOSIS — Z8759 Personal history of other complications of pregnancy, childbirth and the puerperium: Secondary | ICD-10-CM

## 2016-11-29 HISTORY — DX: Personal history of other complications of pregnancy, childbirth and the puerperium: Z87.59

## 2016-11-29 LAB — RPR: RPR Ser Ql: NONREACTIVE

## 2016-11-29 MED ORDER — IBUPROFEN 600 MG PO TABS
600.0000 mg | ORAL_TABLET | Freq: Four times a day (QID) | ORAL | Status: DC
  Administered 2016-11-29 – 2016-12-01 (×7): 600 mg via ORAL
  Filled 2016-11-29 (×7): qty 1

## 2016-11-29 MED ORDER — BUTORPHANOL TARTRATE 1 MG/ML IJ SOLN
1.0000 mg | Freq: Once | INTRAMUSCULAR | Status: AC
  Administered 2016-11-29: 1 mg via INTRAVENOUS

## 2016-11-29 MED ORDER — SIMETHICONE 80 MG PO CHEW: 80.0000 mg | CHEWABLE_TABLET | ORAL | Status: DC | PRN

## 2016-11-29 MED ORDER — TETANUS-DIPHTH-ACELL PERTUSSIS 5-2.5-18.5 LF-MCG/0.5 IM SUSP: 0.5000 mL | Freq: Once | INTRAMUSCULAR | Status: DC

## 2016-11-29 MED ORDER — PHENYLEPHRINE 40 MCG/ML (10ML) SYRINGE FOR IV PUSH (FOR BLOOD PRESSURE SUPPORT)
80.0000 ug | PREFILLED_SYRINGE | INTRAVENOUS | Status: DC | PRN
  Filled 2016-11-29: qty 5
  Filled 2016-11-29 (×2): qty 10

## 2016-11-29 MED ORDER — LIDOCAINE HCL (PF) 1 % IJ SOLN
INTRAMUSCULAR | Status: DC | PRN
  Administered 2016-11-29: 2 mL
  Administered 2016-11-29: 5 mL
  Administered 2016-11-29: 3 mL

## 2016-11-29 MED ORDER — DIBUCAINE 1 % RE OINT: 1.0000 "application " | TOPICAL_OINTMENT | RECTAL | Status: DC | PRN

## 2016-11-29 MED ORDER — FENTANYL 2.5 MCG/ML BUPIVACAINE 1/10 % EPIDURAL INFUSION (WH - ANES)
14.0000 mL/h | INTRAMUSCULAR | Status: DC | PRN
  Administered 2016-11-29: 14 mL/h via EPIDURAL
  Filled 2016-11-29 (×2): qty 100

## 2016-11-29 MED ORDER — WITCH HAZEL-GLYCERIN EX PADS: 1.0000 "application " | MEDICATED_PAD | CUTANEOUS | Status: DC | PRN

## 2016-11-29 MED ORDER — COCONUT OIL OIL
1.0000 "application " | TOPICAL_OIL | Status: DC | PRN
  Administered 2016-11-30: 1 via TOPICAL
  Filled 2016-11-29: qty 120

## 2016-11-29 MED ORDER — LACTATED RINGERS IV SOLN: 500.0000 mL | Freq: Once | INTRAVENOUS | Status: DC

## 2016-11-29 MED ORDER — PHENYLEPHRINE 40 MCG/ML (10ML) SYRINGE FOR IV PUSH (FOR BLOOD PRESSURE SUPPORT)
80.0000 ug | PREFILLED_SYRINGE | INTRAVENOUS | Status: DC | PRN
  Filled 2016-11-29: qty 10
  Filled 2016-11-29: qty 5

## 2016-11-29 MED ORDER — SENNOSIDES-DOCUSATE SODIUM 8.6-50 MG PO TABS
2.0000 | ORAL_TABLET | ORAL | Status: DC
  Administered 2016-11-30: 2 via ORAL
  Filled 2016-11-29: qty 2

## 2016-11-29 MED ORDER — ACETAMINOPHEN 325 MG PO TABS
650.0000 mg | ORAL_TABLET | ORAL | Status: DC | PRN
  Administered 2016-11-29 – 2016-12-01 (×2): 650 mg via ORAL
  Filled 2016-11-29 (×2): qty 2

## 2016-11-29 MED ORDER — ONDANSETRON HCL 4 MG/2ML IJ SOLN
8.0000 mg | Freq: Once | INTRAMUSCULAR | Status: AC
  Administered 2016-11-29: 8 mg via INTRAVENOUS
  Filled 2016-11-29: qty 4

## 2016-11-29 MED ORDER — BUTORPHANOL TARTRATE 1 MG/ML IJ SOLN
INTRAMUSCULAR | Status: AC
Start: 2016-11-29 — End: 2016-11-29
  Administered 2016-11-29: 1 mg via INTRAVENOUS
  Filled 2016-11-29: qty 1

## 2016-11-29 MED ORDER — EPHEDRINE 5 MG/ML INJ
10.0000 mg | INTRAVENOUS | Status: DC | PRN
  Filled 2016-11-29: qty 2

## 2016-11-29 MED ORDER — DIPHENHYDRAMINE HCL 50 MG/ML IJ SOLN: 12.5000 mg | INTRAMUSCULAR | Status: DC | PRN

## 2016-11-29 MED ORDER — LACTATED RINGERS IV SOLN
500.0000 mL | Freq: Once | INTRAVENOUS | Status: AC
  Administered 2016-11-29: 500 mL via INTRAVENOUS

## 2016-11-29 MED ORDER — ZOLPIDEM TARTRATE 5 MG PO TABS: 5.0000 mg | ORAL_TABLET | Freq: Every evening | ORAL | Status: DC | PRN

## 2016-11-29 MED ORDER — LACTATED RINGERS IV SOLN: INTRAVENOUS | Status: DC

## 2016-11-29 MED ORDER — ONDANSETRON HCL 4 MG PO TABS: 4.0000 mg | ORAL_TABLET | ORAL | Status: DC | PRN

## 2016-11-29 MED ORDER — PRENATAL MULTIVITAMIN CH
1.0000 | ORAL_TABLET | Freq: Every day | ORAL | Status: DC
  Administered 2016-11-30: 1 via ORAL
  Filled 2016-11-29: qty 1

## 2016-11-29 MED ORDER — ONDANSETRON HCL 4 MG/2ML IJ SOLN: 4.0000 mg | INTRAMUSCULAR | Status: DC | PRN

## 2016-11-29 MED ORDER — DIPHENHYDRAMINE HCL 25 MG PO CAPS: 25.0000 mg | ORAL_CAPSULE | Freq: Four times a day (QID) | ORAL | Status: DC | PRN

## 2016-11-29 MED ORDER — BENZOCAINE-MENTHOL 20-0.5 % EX AERO
1.0000 "application " | INHALATION_SPRAY | CUTANEOUS | Status: DC | PRN
  Administered 2016-11-29: 1 via TOPICAL
  Filled 2016-11-29: qty 56

## 2016-11-29 MED ORDER — PHENYLEPHRINE 40 MCG/ML (10ML) SYRINGE FOR IV PUSH (FOR BLOOD PRESSURE SUPPORT)
80.0000 ug | PREFILLED_SYRINGE | INTRAVENOUS | Status: DC | PRN
  Filled 2016-11-29: qty 5

## 2016-11-29 NOTE — Anesthesia Preprocedure Evaluation (Signed)
Anesthesia Evaluation  Patient identified by MRN, date of birth, ID band Patient awake    Reviewed: Allergy & Precautions, Patient's Chart, lab work & pertinent test results  Airway Mallampati: III       Dental   Pulmonary neg pulmonary ROS,    Pulmonary exam normal        Cardiovascular negative cardio ROS Normal cardiovascular exam     Neuro/Psych Small cerebral aneurysm TIA   GI/Hepatic negative GI ROS, Neg liver ROS,   Endo/Other  negative endocrine ROS  Renal/GU negative Renal ROS     Musculoskeletal   Abdominal   Peds  Hematology negative hematology ROS (+)   Anesthesia Other Findings   Reproductive/Obstetrics (+) Pregnancy                             Lab Results  Component Value Date   WBC 12.9 (H) 11/28/2016   HGB 13.0 11/28/2016   HCT 38.4 11/28/2016   MCV 82.2 11/28/2016   PLT 261 11/28/2016    Anesthesia Physical Anesthesia Plan  ASA: III  Anesthesia Plan: Epidural   Post-op Pain Management:    Induction:   PONV Risk Score and Plan: Treatment may vary due to age or medical condition  Airway Management Planned: Natural Airway  Additional Equipment:   Intra-op Plan:   Post-operative Plan:   Informed Consent: I have reviewed the patients History and Physical, chart, labs and discussed the procedure including the risks, benefits and alternatives for the proposed anesthesia with the patient or authorized representative who has indicated his/her understanding and acceptance.     Plan Discussed with:   Anesthesia Plan Comments:         Anesthesia Quick Evaluation

## 2016-11-29 NOTE — Progress Notes (Addendum)
Pushing, epidural working well, rectal pressure with UCs  BP 133/71   Pulse 85   Temp 98.5 F (36.9 C) (Oral)   Resp 18   Ht 5\' 6"  (1.676 m)   Wt 210 lb (95.3 kg)   SpO2 98%   BMI 33.89 kg/m  FHT:  Baseline change to 155-160/ mod variab/ + accels/ + intermittent variable decels , overall still category I  UC:   regular, every 2-3 minutes SVE:  Complete and +1, OP/ OT  Complete at 11.25 am with 0 station, now >4 hrs but pushing since 2 hours with some rest in b/w. Station slightly better at +1.   Stage II, Android pelvis, great maternal effort with pushing, OP/ OT--> side pushing to aid rotation Watching FHT closely but no concerns at this point except pelvis and position and working towards [place] delivery. Reviewed possible vacuum since baby AGA but prefer if station lower. Agrees and accepts.   Alysa Duca R 11/29/2016, 3:49 PM

## 2016-11-29 NOTE — Progress Notes (Signed)
S: Doing well, no complaints, pain well controlled with epidural  complete dilation at 11:30, decision made to start pushing, pt only did a few practice pushes then with emesis. Now planning to start pushing after rest/ cleanup  O: BP 126/81   Pulse 99   Temp 98.5 F (36.9 C) (Oral)   Resp 18   Ht 5\' 6"  (1.676 m)   Wt 95.3 kg (210 lb)   SpO2 98%   BMI 33.89 kg/m    FHT:  FHR: 130s bpm, variability: moderate,  accelerations:  Present,  decelerations:  Absent UC:   regular, every 2-3 minutes SVE:   Dilation: 10 Effacement (%): 100 Station: +1 Exam by:: Mabeline Caras, RN  Exam by me: 10/100%/0 station, small caput, ? OP  A / P:  31 y.o.  Obstetric History   G1   P0   T0   P0   A0   L0    SAB0   TAB0   Ectopic0   Multiple0   Live Births0    at [redacted]w[redacted]d IOL after SROM, initial slow prrogress but excellent progress once pt entered active labor. Start pushing now.  Fetal Wellbeing:  Category I Pain Control:  Epidural  Anticipated MOD:  unclear, station still at 0 but has not begun pushing.   Holt Woolbright A. 11/29/2016, 12:08 PM

## 2016-11-29 NOTE — Anesthesia Procedure Notes (Signed)
Epidural Patient location during procedure: OB Start time: 11/29/2016 4:20 AM End time: 11/29/2016 4:32 AM  Staffing Anesthesiologist: Suzette Battiest, MD Performed: anesthesiologist   Preanesthetic Checklist Completed: patient identified, site marked, surgical consent, pre-op evaluation, timeout performed, IV checked, risks and benefits discussed and monitors and equipment checked  Epidural Patient position: sitting Prep: site prepped and draped and DuraPrep Patient monitoring: continuous pulse ox and blood pressure Approach: midline Location: L3-L4 Injection technique: LOR air  Needle:  Needle type: Tuohy  Needle gauge: 17 G Needle length: 9 cm and 9 Needle insertion depth: 8 cm Catheter type: closed end flexible Catheter size: 19 Gauge Catheter at skin depth: 13 cm Test dose: negative  Assessment Events: blood not aspirated, injection not painful, no injection resistance, negative IV test and no paresthesia

## 2016-11-29 NOTE — Progress Notes (Signed)
S: Doing well, no complaints, pain well controlled with epidural, placed around 4:30 am. Pt notes feeling sleepy.  Overnight events: cytotec x 2, starting yesterday afternoon. Pitocin started around 3am- significant delay in starting cytotec, between doses and starting pitocin.  O: BP 123/73   Pulse 96   Temp 98.5 F (36.9 C) (Oral)   Resp 16   Ht 5\' 6"  (1.676 m)   Wt 95.3 kg (210 lb)   SpO2 98%   BMI 33.89 kg/m    FHT:  FHR: 130s bpm, variability: moderate,  accelerations:  Present,  decelerations:  Present starting at 6am, variable and early decelerations UC:   regular, every 3 minutes SVE:   Dilation: 3 Effacement (%): 50 Station: Ballotable Exam by:: Catheryn Bacon, RN  Exam now: 5/90% vtx 0 with some molding  A / P:  31 y.o.  Obstetric History   G1   P0   T0   P0   A0   L0    SAB0   TAB0   Ectopic0   Multiple0   Live Births0    at [redacted]w[redacted]d IOL after SROM, not in labor. Slow prgress given delay in cytotec and then pitocin but pt not in active labor.  Fetal Wellbeing:  Category II. IUPC and FSE placed now, good scalp stim, overall reative tracing, Will work with position changes and possibly amnioinfusion. Pain Control:  Epidural  Anticipated MOD:  unclear, working towards vaginal delivery  Kwaku Mostafa A. 11/29/2016, 8:44 AM

## 2016-11-30 DIAGNOSIS — R338 Other retention of urine: Secondary | ICD-10-CM | POA: Diagnosis not present

## 2016-11-30 LAB — CBC
HCT: 32 % — ABNORMAL LOW (ref 36.0–46.0)
HEMOGLOBIN: 10.8 g/dL — AB (ref 12.0–15.0)
MCH: 28.1 pg (ref 26.0–34.0)
MCHC: 33.8 g/dL (ref 30.0–36.0)
MCV: 83.1 fL (ref 78.0–100.0)
Platelets: 202 10*3/uL (ref 150–400)
RBC: 3.85 MIL/uL — ABNORMAL LOW (ref 3.87–5.11)
RDW: 14.1 % (ref 11.5–15.5)
WBC: 13.9 10*3/uL — ABNORMAL HIGH (ref 4.0–10.5)

## 2016-11-30 MED ORDER — FUROSEMIDE 10 MG/ML IJ SOLN
10.0000 mg | Freq: Once | INTRAMUSCULAR | Status: AC
  Administered 2016-11-30: 10 mg via INTRAVENOUS
  Filled 2016-11-30: qty 1

## 2016-11-30 NOTE — Lactation Note (Signed)
This note was copied from a baby's chart. Lactation Consultation Note  Patient Name: Shelby Cunningham GGYIR'S Date: 11/30/2016 Reason for consult: Initial assessment;Early term 37-38.6wks Breastfeeding consultation services and support information given and reviewed.  Newborn is 53 hours old and latching for brief periods.  Mom states he is fussy or falls asleep.  Mom has hand expressed and spoon fed two feedings.  DEBP has been initiated for stimulation.  Baby is currently sleeping in crib.  Mom reports trying to latch 30 minutes ago.  Instructed to call for [facility] when baby starts to cue.  Maternal Data Has patient been taught Hand Expression?: Yes  Feeding Feeding Type: Breast Milk Length of feed: 0 min  LATCH Score                   Interventions    Lactation Tools Discussed/Used Tools: Shells;Pump Shell Type: Other (comment)(short shafted and swelling) Breast pump type: Double-Electric Breast Pump Pump Review: Setup, frequency, and cleaning;Milk Storage Initiated by:: Heide Guile  Date initiated:: 11/30/16   Consult Status Consult Status: Follow-up Date: 11/30/16 Follow-up type: In-patient    Ave Filter 11/30/2016, 12:50 PM

## 2016-11-30 NOTE — Progress Notes (Signed)
Patient has attempted 5 times since foley has been removed. She now has the sensation to void but is unable to void. Bladder scanned for 683. Shelby Cunningham, CNM notified that patient is unable to void for 6 hours now. She said to give the patient another hour to attempt to void, and if unable then to reinsert the foley overnight and she will make rounds in the am for another bladder wean attempt. Timoteo Ace, RN

## 2016-11-30 NOTE — Progress Notes (Signed)
Interval note: Notified by nursing staff, pt. Having acute urinary retention since delivery.  Pt delivered by VAVD with a 2nd degree and left labial lacerations with slight hematoma. She reports pt. Has  severe vulvar edema since delivery.  Pt. Has attempted to void several times, without success.  Pt. Had in and out cath x 1, for >1042mL urine output.  Almost 6 hours later, pt. C/o bladder distention, unable to void, bladder scan shows 1017mL of urine.  RN reports due to edema, she is able to identify urethral opening, but states it appears distorted and shifted to the side. Advised okay to replace foley catheter and reassess on rounds tomorrow.    Lars Pinks, CNM

## 2016-11-30 NOTE — Lactation Note (Addendum)
This note was copied from a baby's chart. Lactation Consultation Note  Patient Name: Shelby Cunningham HWEXH'B Date: 11/30/2016 Reason for consult: Follow-up assessment;Early term 37-38.6wks when LC entered the room  [place] doing the baby's assessment.  LC assessed breast tissue with moms permission and noted areola edema bilaterally indicating use of shells.  Mom has had shells, hand pump and today the DEBP was set up due to the baby not eating.  Baby has had EBM spoon fed x2 ( total  of 4 ml ).  LC assisted with and without the Nipple Shield to latch , and baby only able to sustain the latch for a few sucks Without the NS. LC sized mom for #20 NS and #24 NS, and the #24 NS fit the best and the baby was able to accommodate The #24 well with firm support. Baby would latch, but not stay in a active feeding pattern.  LC and NP recommended due to the weight of the baby being less that 6 pounds and being 24 hours - needing supplementing  To keep his energy up so he can be effective with feedings . Mom aware to post pump for 15 -20 mins and save milk.  See doc flow sheets for details.  LC reviewed the [place] plan with the [place] plan  Breast shells between feedings  Prior to latch - breast massage , hand express, pre-pump with hand pump  Apply #24 NS , instill EBM or formula  And latch - firm support - feed for 15 -20 mins , offer 2nd breast.  Post pump 15 -20 mins both breast with #24 [place]    Maternal Data Has patient been taught Hand Expression?: Yes Does the patient have breastfeeding experience prior to this delivery?: No  Feeding Feeding Type: Breast Fed Length of feed: (few strong sucks , no swallows )  LATCH Score Latch: Repeated attempts needed to sustain latch, nipple held in mouth throughout feeding, stimulation needed to elicit sucking reflex.  Audible Swallowing: None  Type of Nipple: Everted at rest and after stimulation(areola edema / )  Comfort (Breast/Nipple):  Soft / non-tender  Hold (Positioning): Full assist, staff holds infant at breast  LATCH Score: 5  Interventions Interventions: Breast feeding basics reviewed;Assisted with latch;Skin to skin;Breast massage;Hand express;Breast compression(NS needed for latch )  Lactation Tools Discussed/Used Tools: Nipple Shields Nipple shield size: 20;24;Other (comment)(#20 NS to small . #24 fit well ) Shell Type: Inverted Breast pump type: Double-Electric Breast Pump;Manual   Consult Status Consult Status: Follow-up Date: 12/01/16 Follow-up type: In-patient    [place] 11/30/2016, 4:31 PM

## 2016-11-30 NOTE — Anesthesia Postprocedure Evaluation (Signed)
Anesthesia Post Note  Patient: Shelby Cunningham  Procedure(s) Performed: AN AD [place]     Patient location during evaluation: Mother Baby Anesthesia Type: Epidural Level of consciousness: awake Pain management: satisfactory to patient Vital Signs Assessment: post-procedure vital signs reviewed and stable Respiratory status: spontaneous breathing Cardiovascular status: stable Anesthetic complications: no    Last Vitals:  Vitals:   11/30/16 0525 11/30/16 0740  BP: (!) 105/52 (!) 108/58  Pulse: 80 80  Resp: 18 18  Temp: 36.5 C 36.9 C  SpO2:  99%    Last Pain:  Vitals:   11/30/16 0740  TempSrc: Axillary  PainSc: 1    Pain Goal: Patients Stated Pain Goal: 4 (11/29/16 0032)               Casimer Lanius

## 2016-11-30 NOTE — Progress Notes (Signed)
PPD 1 VAVD with 2nd degree and left labial repair with hematoma  S:  Reports feeling well             Tolerating po/ No nausea or vomiting             Bleeding is light             Pain controlled with motrin             Up ad lib / ambulatory / voiding QS  Newborn breast feeding / female - no circ planned  O:      VS: BP (!) 108/58 (BP Location: Right Arm)   Pulse 80   Temp 98.5 F (36.9 C) (Axillary)   Resp 18   Ht 5\' 6"  (1.676 m)   Wt 95.3 kg (210 lb)   SpO2 99%   Breastfeeding? Unknown   BMI 33.89 kg/m    LABS:              Recent Labs    11/28/16 1500 11/30/16 0616  WBC 12.9* 13.9*  HGB 13.0 10.8*  PLT 261 202               Blood type: --/--/O POS, O POS (11/12 1500)  Rubella: Immune (04/19 0000)                Flu and tdap current 2018                  I&O: output 3175 / no intake measured to compare              Physical Exam:             Alert and oriented X3  Abdomen: soft, non-tender, non-distended              Fundus: firm, non-tender, U-1  Perineum: moderate edema and labial edema             Foley in place with clear yellow urine  Lochia: light  Extremities: 2+ edema, no calf pain or tenderness  A: PPD # 1 VAVD with 2nd degree repair and left labial repair with hematoma             Urinary retention / outlet obstruction due to edema and swelling from delivery             Dependent edema   Doing well - stable status  P: Routine post partum orders  lasix IV with IV access and foley in place to reduce dependent edema             Ice packs frequently changed in next 3-4 hours to further reduce swelling             Remove foley at noon to re-start bladder trial for voiding - empty every 2 hours while awake this PM             Recheck status this afternoon  Artelia Laroche CNM, MSN, [facility] 11/30/2016, 9:12 AM

## 2016-12-01 MED ORDER — IBUPROFEN 600 MG PO TABS: 600.0000 mg | ORAL_TABLET | Freq: Four times a day (QID) | ORAL | 0 refills | Status: DC

## 2016-12-01 NOTE — Progress Notes (Signed)
Successful void x 2 - no pain or difficulty with urination Ready to go home  Dr Ronita Hipps updated with status and DC plan  DC home - keep bladder empty & call if any urinary symptoms or concerns WOB booklet - instructions reviewed  Artelia Laroche CNM [facility] 12/01/2016 @ 1435

## 2016-12-01 NOTE — Discharge Summary (Signed)
Obstetric Discharge Summary Reason for Admission: onset of labor Prenatal Procedures: ultrasound and fetal echo Intrapartum Procedures: vacuum, GBS prophylaxis and epidural Postpartum Procedures: Urinary retention PPD 0 x 8 hours with failed voiding trial - foley reinserted x 15 hr with successful voiding after removal on PPD 2 Complications-Operative and Postpartum: urinary retention  Hemoglobin  Date Value Ref Range Status  11/30/2016 10.8 (L) 12.0 - 15.0 g/dL Final   HCT  Date Value Ref Range Status  11/30/2016 32.0 (L) 36.0 - 46.0 % Final    Physical Exam:  General: alert, cooperative and no distress Lochia: appropriate Uterine Fundus: firm Incision: healing well DVT Evaluation: No evidence of DVT seen on physical exam.  Discharge Diagnoses: Term Pregnancy-delivered, PPD 2 s/p VAVD, postpartum urinary retention - resolved  Discharge Information: Date: 12/01/2016 Activity: pelvic rest Diet: routine Medications: PNV and Ibuprofen Condition: stable Instructions: refer to practice specific booklet Discharge to: home Follow-up Information    Azucena Fallen, MD. Schedule an appointment as soon as possible for a visit in 6 week(s).   Specialty:  Obstetrics and Gynecology Contact information: [place] [address] [phone]           Newborn Data: Live born female  Birth Weight: 6 lb 1 oz (2750 g) APGAR: 47, 33  Newborn Delivery   Birth date/time:  11/29/2016 16:24:00 Delivery type:  Vaginal, Vacuum (Extractor)     Home with mother.  Artelia Laroche 12/01/2016, 2:39 PM

## 2016-12-01 NOTE — Progress Notes (Signed)
PPD 2 VAVD  S:  Reports feeling nervous about peeing - worried if she can't go by herself  / edema and pain much better today             Tolerating po/ No nausea or vomiting             Bleeding is light             Pain controlled with motrin             Up ad lib / ambulatory / voiding QS  Newborn breast feeding   O:       VS: BP 117/69 (BP Location: Right Arm)   Pulse 78   Temp 98.3 F (36.8 C) (Oral)   Resp 18   Ht 5\' 6"  (1.676 m)   Wt 95.3 kg (210 lb)   SpO2 100%   Breastfeeding? Unknown   BMI 33.89 kg/m                Physical Exam:             Alert and oriented X3  Abdomen: soft, non-tender, non-distended              Fundus: firm, non-tender, U-1  Perineum: significant reduction in labor  Lochia: light  Extremities: no edema, no calf pain or tenderness    A: PPD # 2 VAVD             Urinary retention - 2nd voiding trial this am  Doing well - stable status  P: Routine post partum orders              Attempt to void every 1-2 hours - cold water - peppermint oil drops this pm if no void in next 4 hours              Explained if voids x 2 will DC home this pm                               if unable to void will replace catheter to go home then recheck in office Monday    Artelia Laroche CNM, MSN, [facility] 12/01/2016, 10:16 AM

## 2016-12-01 NOTE — Lactation Note (Signed)
This note was copied from a baby's chart. Lactation Consultation Note  Patient Name: Shelby Cunningham [id number] Date: 12/01/2016 Reason for consult: Follow-up assessment   Baby 67 hours old.  Mother complaining of nipple soreness.  No cracks or abrasions visible. Mother states she has coconut oil for nipple soreness.  Assisted w/ placing shell in bra to help. Baby cueing. Offered to assist with breastfeeding and mother declined assistance. Mother states she wants to wait to try breastfeeding once her nipple soreness subsides. Suggest calling to make OP appointment once home. Encouraged mother to pump q 3 hours. Mom encouraged to feed baby 8-12 times/24 hours and with feeding cues.  Reviewed engorgement care and monitoring voids/stools.    Maternal Data    Feeding Feeding Type: Formula  LATCH Score                   Interventions    Lactation Tools Discussed/Used     Consult Status Consult Status: Complete    Carlye Grippe 12/01/2016, 9:49 AM

## 2017-01-13 ENCOUNTER — Other Ambulatory Visit: Payer: Self-pay

## 2017-01-13 ENCOUNTER — Encounter: Payer: Self-pay | Admitting: Women's Health

## 2017-01-13 ENCOUNTER — Ambulatory Visit (INDEPENDENT_AMBULATORY_CARE_PROVIDER_SITE_OTHER): Payer: Medicaid Other | Admitting: Women's Health

## 2017-01-13 ENCOUNTER — Ambulatory Visit (INDEPENDENT_AMBULATORY_CARE_PROVIDER_SITE_OTHER): Payer: Medicaid Other | Admitting: *Deleted

## 2017-01-13 DIAGNOSIS — I671 Cerebral aneurysm, nonruptured: Secondary | ICD-10-CM

## 2017-01-13 DIAGNOSIS — Z30013 Encounter for initial prescription of injectable contraceptive: Secondary | ICD-10-CM | POA: Diagnosis not present

## 2017-01-13 DIAGNOSIS — Z3202 Encounter for pregnancy test, result negative: Secondary | ICD-10-CM

## 2017-01-13 DIAGNOSIS — Z3042 Encounter for surveillance of injectable contraceptive: Secondary | ICD-10-CM | POA: Diagnosis not present

## 2017-01-13 LAB — POCT URINE PREGNANCY: Preg Test, Ur: NEGATIVE

## 2017-01-13 MED ORDER — MEDROXYPROGESTERONE ACETATE 150 MG/ML IM SUSP
150.0000 mg | Freq: Once | INTRAMUSCULAR | Status: AC
  Administered 2017-01-13: 150 mg via INTRAMUSCULAR

## 2017-01-13 MED ORDER — MEDROXYPROGESTERONE ACETATE 150 MG/ML IM SUSP: 150.0000 mg | INTRAMUSCULAR | 3 refills | Status: DC

## 2017-01-13 NOTE — Patient Instructions (Addendum)
NO SEX UNTIL AFTER YOU GET YOUR BIRTH CONTROL   Constipation  Drink plenty of fluid, preferably water, throughout the day  Eat foods high in fiber such as fruits, vegetables, and grains  Exercise, such as walking, is a good way to keep your bowels regular  Drink warm fluids, especially warm prune juice, or decaf coffee  Eat a 1/2 cup of real oatmeal (not instant), 1/2 cup applesauce, and 1/2-1 cup warm prune juice every day  If needed, you may take Colace (docusate sodium) stool softener once or twice a day to help keep the stool soft. If you are pregnant, wait until you are out of your first trimester (12-14 weeks of pregnancy)  If you still are having problems with constipation, you may take Miralax once daily as needed to help keep your bowels regular.  If you are pregnant, wait until you are out of your first trimester (12-14 weeks of pregnancy)     Medroxyprogesterone injection [Contraceptive] What is this medicine? MEDROXYPROGESTERONE (me DROX ee proe JES te rone) contraceptive injections prevent pregnancy. They provide effective birth control for 3 months. Depo-subQ Provera 104 is also used for treating pain related to endometriosis. This medicine may be used for other purposes; ask your health care provider or pharmacist if you have questions. COMMON BRAND NAME(S): Depo-Provera, Depo-subQ Provera 104 What should I tell my health care provider before I take this medicine? They need to know if you have any of these conditions: -frequently drink alcohol -asthma -blood vessel disease or a history of a blood clot in the lungs or legs -bone disease such as osteoporosis -breast cancer -diabetes -eating disorder (anorexia nervosa or bulimia) -high blood pressure -HIV infection or AIDS -kidney disease -liver disease -mental depression -migraine -seizures (convulsions) -stroke -tobacco smoker -vaginal bleeding -an unusual or allergic reaction to medroxyprogesterone, other  hormones, medicines, foods, dyes, or preservatives -pregnant or trying to get pregnant -breast-feeding How should I use this medicine? Depo-Provera Contraceptive injection is given into a muscle. Depo-subQ Provera 104 injection is given under the skin. These injections are given by a health care professional. You must not be pregnant before getting an injection. The injection is usually given during the first 5 days after the start of a menstrual period or 6 weeks after delivery of a baby. Talk to your pediatrician regarding the use of this medicine in children. Special care may be needed. These injections have been used in female children who have started having menstrual periods. Overdosage: If you think you have taken too much of this medicine contact a poison control center or emergency room at once. NOTE: This medicine is only for you. Do not share this medicine with others. What if I miss a dose? Try not to miss a dose. You must get an injection once every 3 months to maintain birth control. If you cannot keep an appointment, call and reschedule it. If you wait longer than 13 weeks between Depo-Provera contraceptive injections or longer than 14 weeks between Depo-subQ Provera 104 injections, you could get pregnant. Use another method for birth control if you miss your appointment. You may also need a pregnancy test before receiving another injection. What may interact with this medicine? Do not take this medicine with any of the following medications: -bosentan This medicine may also interact with the following medications: -aminoglutethimide -antibiotics or medicines for infections, especially rifampin, rifabutin, rifapentine, and griseofulvin -aprepitant -barbiturate medicines such as phenobarbital or primidone -bexarotene -carbamazepine -medicines for seizures like ethotoin, felbamate, oxcarbazepine,  phenytoin, topiramate -modafinil -St. John's wort This list may not describe all  possible interactions. Give your health care provider a list of all the medicines, herbs, non-prescription drugs, or dietary supplements you use. Also tell them if you smoke, drink alcohol, or use illegal drugs. Some items may interact with your medicine. What should I watch for while using this medicine? This drug does not protect you against HIV infection (AIDS) or other sexually transmitted diseases. Use of this product may cause you to lose calcium from your bones. Loss of calcium may cause weak bones (osteoporosis). Only use this product for more than 2 years if other forms of birth control are not right for you. The longer you use this product for birth control the more likely you will be at risk for weak bones. Ask your health care professional how you can keep strong bones. You may have a change in bleeding pattern or irregular periods. Many females stop having periods while taking this drug. If you have received your injections on time, your chance of being pregnant is very low. If you think you may be pregnant, see your health care professional as soon as possible. Tell your health care professional if you want to get pregnant within the next year. The effect of this medicine may last a long time after you get your last injection. What side effects may I notice from receiving this medicine? Side effects that you should report to your doctor or health care professional as soon as possible: -allergic reactions like skin rash, itching or hives, swelling of the face, lips, or tongue -breast tenderness or discharge -breathing problems -changes in vision -depression -feeling faint or lightheaded, falls -fever -pain in the abdomen, chest, groin, or leg -problems with balance, talking, walking -unusually weak or tired -yellowing of the eyes or skin Side effects that usually do not require medical attention (report to your doctor or health care professional if they continue or are  bothersome): -acne -fluid retention and swelling -headache -irregular periods, spotting, or absent periods -temporary pain, itching, or skin reaction at site where injected -weight gain This list may not describe all possible side effects. Call your doctor for medical advice about side effects. You may report side effects to FDA at 1-800-FDA-1088. Where should I keep my medicine? This does not apply. The injection will be given to you by a health care professional. NOTE: This sheet is a summary. It may not cover all possible information. If you have questions about this medicine, talk to your doctor, pharmacist, or health care provider.  2018 Elsevier/Gold Standard (2008-01-25 18:37:56)

## 2017-01-13 NOTE — Progress Notes (Signed)
   POSTPARTUM VISIT Patient name: Shelby Cunningham MRN [id number]  Date of birth: [birth year] Chief Complaint:   Postpartum Care  History of Present Illness:   Shelby Cunningham is a 31 y.o. [id number] Hispanic female being seen today for a postpartum visit. She is 6 weeks postpartum following a vacuum, low at 38.5 gestational weeks. Anesthesia: epidural. I have fully reviewed the prenatal and intrapartum course. Pregnancy uncomplicated. Prenatal care/delivery w/ [facility], however insurance changed to [facility] and they would no longer see her. 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  Postpartum course has been complicated by urinary retention PPD 0 x 8 hours, foley reinserted x 15hr w/ successful voiding after removal on PPD 2. Bleeding no bleeding. Bowel function is constipation- stool softener and prune juice help. Bladder function is normal.  Patient is not sexually active. Last sexual activity: prior to birth of baby.  Contraception method is wants depo.  [place] Postpartum Depression Screening: negative. Score 0.   Last pap at beginning of pregnancy w/ [facility].  Results were normal .  No LMP recorded.  Baby's course has been uncomplicated. Baby is feeding by bottle.  Review of Systems:   Pertinent items are noted in HPI Denies Abnormal vaginal discharge w/ itching/odor/irritation, headaches, visual changes, shortness of breath, chest pain, abdominal pain, severe nausea/vomiting, or problems with urination or bowel movements. Pertinent History Reviewed:  Reviewed past medical,surgical, obstetrical and family history.  Reviewed problem list, medications and allergies. OB History  Gravida Para Term Preterm AB Living  1 1 1     1   SAB TAB Ectopic Multiple Live Births        0 1    # Outcome Date GA Lbr Len/2nd Weight Sex Delivery Anes PTL Lv  1 Term 11/29/16 [redacted]w[redacted]d / 04:59 6 lb 1 oz (2.75 kg) M Vag-Vacuum EPI  LIV       Physical Assessment:   Vitals:   01/13/17 1106  BP: 134/78  Pulse: 78  Weight: 190 lb (86.2 kg)  Height: 5\' 6"  (1.676 m)  Body mass index is 30.67 kg/m.       Physical Examination:   General appearance: alert, well appearing, and in no distress  Mental status: alert, oriented to person, place, and time  Skin: warm & dry   Cardiovascular: normal heart rate noted   Respiratory: normal respiratory effort, no distress   Breasts: deferred, no complaints   Abdomen: soft, non-tender   Pelvic: VULVA: normal appearing vulva with no masses, tenderness or lesions, UTERUS: uterus is normal size, shape, consistency and nontender  Rectal: no hemorrhoids  Extremities: no edema       Results for orders placed or performed in visit on 01/13/17 (from the past 24 hour(s))  POCT urine pregnancy   Collection Time: 01/13/17 11:20 AM  Result Value Ref Range   Preg Test, Ur Negative Negative    Assessment & Plan:  1) Postpartum exam 2) 6 wks s/p VAVB 3) Bottlefeeding 4) Depression screening 5) Contraception counseling, pt prefers Depo-Provera injections, rx sent to [facility] [place]  Follow-up: Return for today for depo, then 68mths for physical; sign release for pap from [employer].   Orders Placed This Encounter  Procedures  . POCT urine pregnancy    Tawnya Crook CNM, [facility] 01/13/2017 11:56 AM

## 2017-01-13 NOTE — Progress Notes (Signed)
Pt in for depo injection. Given in left deltoid im. Pt tolerated well.

## 2017-01-25 ENCOUNTER — Encounter: Payer: Self-pay | Admitting: Advanced Practice Midwife

## 2017-01-25 ENCOUNTER — Other Ambulatory Visit: Payer: Self-pay

## 2017-01-25 ENCOUNTER — Ambulatory Visit (INDEPENDENT_AMBULATORY_CARE_PROVIDER_SITE_OTHER): Payer: Medicaid Other | Admitting: Advanced Practice Midwife

## 2017-01-25 ENCOUNTER — Emergency Department (HOSPITAL_COMMUNITY)
Admission: EM | Admit: 2017-01-25 | Discharge: 2017-01-26 | Disposition: A | Discharge status: Other Acute Inpatient Hospital | Payer: Medicaid Other | Attending: Emergency Medicine | Admitting: Emergency Medicine

## 2017-01-25 ENCOUNTER — Encounter (HOSPITAL_COMMUNITY): Payer: Self-pay | Admitting: Emergency Medicine

## 2017-01-25 VITALS — BP 130/72 | HR 82 | Ht 66.0 in | Wt 187.0 lb

## 2017-01-25 DIAGNOSIS — F329 Major depressive disorder, single episode, unspecified: Secondary | ICD-10-CM | POA: Insufficient documentation

## 2017-01-25 DIAGNOSIS — Z046 Encounter for general psychiatric examination, requested by authority: Secondary | ICD-10-CM | POA: Insufficient documentation

## 2017-01-25 DIAGNOSIS — R45851 Suicidal ideations: Secondary | ICD-10-CM | POA: Diagnosis not present

## 2017-01-25 DIAGNOSIS — Z79899 Other long term (current) drug therapy: Secondary | ICD-10-CM | POA: Insufficient documentation

## 2017-01-25 LAB — PREGNANCY, URINE: PREG TEST UR: NEGATIVE

## 2017-01-25 LAB — CBC WITH DIFFERENTIAL/PLATELET
BASOS ABS: 0.1 10*3/uL (ref 0.0–0.1)
Basophils Relative: 1 %
EOS ABS: 0.4 10*3/uL (ref 0.0–0.7)
EOS PCT: 4 %
HCT: 43.7 % (ref 36.0–46.0)
Hemoglobin: 14.4 g/dL (ref 12.0–15.0)
Lymphocytes Relative: 29 %
Lymphs Abs: 3 10*3/uL (ref 0.7–4.0)
MCH: 27.3 pg (ref 26.0–34.0)
MCHC: 33 g/dL (ref 30.0–36.0)
MCV: 82.9 fL (ref 78.0–100.0)
MONO ABS: 0.7 10*3/uL (ref 0.1–1.0)
Monocytes Relative: 7 %
Neutro Abs: 6.1 10*3/uL (ref 1.7–7.7)
Neutrophils Relative %: 59 %
PLATELETS: 305 10*3/uL (ref 150–400)
RBC: 5.27 MIL/uL — ABNORMAL HIGH (ref 3.87–5.11)
RDW: 13 % (ref 11.5–15.5)
WBC: 10.2 10*3/uL (ref 4.0–10.5)

## 2017-01-25 LAB — BASIC METABOLIC PANEL
ANION GAP: 9 (ref 5–15)
BUN: 11 mg/dL (ref 6–20)
CALCIUM: 9.4 mg/dL (ref 8.9–10.3)
CO2: 25 mmol/L (ref 22–32)
Chloride: 106 mmol/L (ref 101–111)
Creatinine, Ser: 0.71 mg/dL (ref 0.44–1.00)
GFR calc Af Amer: >60 mL/min (ref >60)
Glucose, Bld: 97 mg/dL (ref 65–99)
Potassium: 3.3 mmol/L — ABNORMAL LOW (ref 3.5–5.1)
Sodium: 140 mmol/L (ref 135–145)

## 2017-01-25 LAB — ETHANOL: Alcohol, Ethyl (B): <10 mg/dL (ref <10)

## 2017-01-25 LAB — RAPID URINE DRUG SCREEN, HOSP PERFORMED
Amphetamines: NOT DETECTED
BENZODIAZEPINES: NOT DETECTED
Barbiturates: NOT DETECTED
COCAINE: NOT DETECTED
Opiates: NOT DETECTED
Tetrahydrocannabinol: NOT DETECTED

## 2017-01-25 MED ORDER — PRENATAL 27-0.8 MG PO TABS
1.0000 | ORAL_TABLET | Freq: Every day | ORAL | Status: DC
  Administered 2017-01-25: 1 via ORAL
  Filled 2017-01-25 (×3): qty 1

## 2017-01-25 NOTE — BH Assessment (Signed)
Tele Assessment Note   Patient Name: Shelby Cunningham MRN: [id number] Referring Physician: Reinaldo Meeker, MD Location of Patient: [facility] Location of Provider: [place] [facility]  Danesha Kirchoff is an 32 y.o. female presents to [place] voluntarily. Pt reports she has a 39 month old. Pt reports she had a panic attack yesterday with thoughts of harming herself and fears of harming her newborn. Pt reports she is having "thoughts or visions of dropping her baby". Pt denies hx of depression or SI. Pt denies homicidal thoughts or physical aggression. Pt denies having access to firearms. Pt denies having any legal problems at this time. Pt denies hallucinations. Pt does not appear to be responding to internal stimuli and exhibits no delusional thought. Pt's reality testing appears to be intact. Pt denies any current or past substance abuse problems. Pt does not appear to be intoxicated or in withdrawal at this time. Pt lives with her husband and child. Pt denies hx of mental health up until this episode.   Pt is dressed in scrubs, alert, oriented x4 with normal speech and normal motor behavior. Eye contact is good and Pt is tearful. Pt's mood is depressed and affect is anxious. Thought process is coherent and relevant. Pt's insight is fair and judgement is impaired. There is no indication Pt is currently responding to internal stimuli or experiencing delusional thought content. Pt was cooperative throughout assessment. Pt says she is willing to sign voluntarily into a psychiatric facility.     Diagnosis: F32.2 Major depressive disorder, Single episode, Severe   Past Medical History:  Past Medical History:  Diagnosis Date  . Aneurysm ([place])    Brain - has checked every 6 months  . Exercise-induced shortness of breath    "sometimes at the gym" (01/30/2012)  . No pertinent past medical history   . Status post vacuum-assisted vaginal delivery 11/29/2016  . TIA (transient ischemic attack) 01/14     Past Surgical History:  Procedure Laterality Date  . NO PAST SURGERIES    . TEE WITHOUT CARDIOVERSION  01/31/2012   Procedure: TRANSESOPHAGEAL ECHOCARDIOGRAM (TEE);  Surgeon: Lelon Perla, MD;  [facility] ENDOSCOPY;  Service: Cardiovascular;  Laterality: N/A;    Family History:  Family History  Problem Relation Age of Onset  . Diabetes Mother   . Diabetes Paternal Uncle     Social History:  reports that  has never smoked. she has never used smokeless tobacco. She reports that she does not drink alcohol or use drugs.  Additional Social History:  Alcohol / Drug Use Pain Medications: See MAR Prescriptions: See MAR Over the Counter: See MAR History of alcohol / drug use?: No history of alcohol / drug abuse  CIWA: CIWA-Ar BP: (!) 155/87 Pulse Rate: 77 COWS:    PATIENT STRENGTHS: (choose at least two) Average or above average intelligence Motivation for treatment/growth Supportive family/friends  Allergies: No Known Allergies  Home Medications:  (Not in a hospital admission)  OB/GYN Status:  No LMP recorded.  General Assessment Data Location of Assessment: [facility] TTS Assessment: In system Is this a Tele or Face-to-Face Assessment?: Tele Assessment Is this an Initial Assessment or a Re-assessment for this encounter?: Initial Assessment Marital status: Single Is patient pregnant?: No Pregnancy Status: No Living Arrangements: Spouse/significant other Can pt return to current living arrangement?: Yes Admission Status: Voluntary Is patient capable of signing voluntary admission?: Yes Referral Source: Self/Family/Friend Insurance type: Medicaid  Medical Screening Exam ([place]) Medical Exam completed: Yes  [place]  Living Arrangements: Spouse/significant other Name of Psychiatrist: None Name of Therapist: None  Education Status Is patient currently in school?: No Highest grade of school patient has completed: BA  Risk to self with the  past 6 months Suicidal Ideation: Yes-Currently Present Has patient been a risk to self within the past 6 months prior to admission? : No Suicidal Intent: Yes-Currently Present Has patient had any suicidal intent within the past 6 months prior to admission? : No Is patient at risk for suicide?: Yes Suicidal Plan?: No Has patient had any suicidal plan within the past 6 months prior to admission? : No Access to Means: No What has been your use of drugs/alcohol within the last 12 months?: None Previous Attempts/Gestures: No Other Self Harm Risks: None Intentional Self Injurious Behavior: None Family Suicide History: No Recent stressful life event(s): Other (Comment)(Newborn) Persecutory voices/beliefs?: No Depression: Yes Depression Symptoms: Tearfulness, Fatigue, Feeling worthless/self pity, Guilt Substance abuse history and/or treatment for substance abuse?: No Suicide prevention information given to non-admitted patients: Not applicable  Risk to Others within the past 6 months Homicidal Ideation: No Does patient have any lifetime risk of violence toward others beyond the six months prior to admission? : No Thoughts of Harm to Others: Yes-Currently Present(Images of droppping her baby) Comment - Thoughts of Harm to Others: Images of dropping her baby Current Homicidal Intent: No Current Homicidal Plan: No Access to Homicidal Means: No Identified Victim: Newborn History of harm to others?: No Assessment of Violence: None Noted Violent Behavior Description: None Does patient have access to weapons?: No Criminal Charges Pending?: No Does patient have a court date: No Is patient on probation?: No  Psychosis Hallucinations: None noted Delusions: None noted  Mental Status Report Appearance/Hygiene: In scrubs Eye Contact: Good Motor Activity: Freedom of movement Speech: Logical/coherent Level of Consciousness: Alert Mood: Depressed Affect: Sad, Anxious Anxiety Level:  Minimal Thought Processes: Coherent, Relevant Judgement: Impaired Orientation: Person, Place, Time, Situation, Appropriate for developmental age Obsessive Compulsive Thoughts/Behaviors: Minimal  Cognitive Functioning Concentration: Normal Memory: Recent Intact IQ: Average Insight: Fair Impulse Control: Fair Appetite: Good Total Hours of Sleep: 4 Vegetative Symptoms: None  ADLScreening [facility] Assessment Services) Patient's cognitive ability adequate to safely complete daily activities?: Yes Patient able to express need for assistance with ADLs?: Yes Independently performs ADLs?: Yes (appropriate for developmental age)  Prior Inpatient Therapy Prior Inpatient Therapy: No  Prior Outpatient Therapy Prior Outpatient Therapy: No Does patient have an ACCT team?: No Does patient have Intensive In-House Services?  : No Does patient have [facility] services? : No Does patient have P4CC services?: No  ADL Screening (condition at time of admission) Patient's cognitive ability adequate to safely complete daily activities?: Yes Is the patient deaf or have difficulty hearing?: No Does the patient have difficulty seeing, even when wearing glasses/contacts?: No Does the patient have difficulty concentrating, remembering, or making decisions?: No Patient able to express need for assistance with ADLs?: Yes Does the patient have difficulty dressing or bathing?: No Independently performs ADLs?: Yes (appropriate for developmental age) Does the patient have difficulty walking or climbing stairs?: No Weakness of Legs: None Weakness of Arms/Hands: None       Abuse/Neglect Assessment (Assessment to be complete while patient is alone) Abuse/Neglect Assessment Can Be Completed: Yes Physical Abuse: Denies Verbal Abuse: Denies Sexual Abuse: Denies Exploitation of patient/patient's resources: Denies Self-Neglect: Denies Values / Beliefs Cultural Requests During Hospitalization: None Spiritual  Requests During Hospitalization: None Consults Spiritual Care Consult Needed: No Social Work Scientific laboratory technician  Needed: No Advance Directives (For Healthcare) Does Patient Have a Medical Advance Directive?: No Would patient like information on creating a medical advance directive?: No - Patient declined    Additional Information 1:1 In Past 12 Months?: No CIRT Risk: No Elopement Risk: No Does patient have medical clearance?: Yes     Disposition:  Disposition Initial Assessment Completed for this Encounter: Yes Disposition of Patient: Inpatient treatment program Type of inpatient treatment program: Adult   Patriciaann Clan, PA recommends inpatient treatment.    This service was provided via telemedicine using a 2-way, interactive audio and video technology.  Names of all persons participating in this telemedicine service and their role in this encounter. Name: Lavell Ridings  Role: Pt  Name: Steffanie Rainwater, [place], [place] Role: Therapeutic Triage Specialist  Name:  Role:   Name:  Role:     Steffanie Rainwater, [place], LPCA 01/25/2017 8:50 PM

## 2017-01-25 NOTE — ED Provider Notes (Signed)
[facility] EMERGENCY DEPARTMENT Provider Note   CSN: [id number] Arrival date & time: 01/25/17  1519     History   Chief Complaint Chief Complaint  Patient presents with  . Medical Clearance  . Depression  . Suicidal    HPI Shelby Cunningham is a 32 y.o. female.  Patient states that she has been depressed and thinking about wanting to die.  She does not have a plan   The history is provided by the patient.  Depression  This is a new problem. The current episode started more than 1 week ago. The problem occurs constantly. The problem has not changed since onset.Pertinent negatives include no chest pain, no abdominal pain and no headaches. Nothing aggravates the symptoms. Nothing relieves the symptoms. She has tried nothing for the symptoms. The treatment provided no relief.    Past Medical History:  Diagnosis Date  . Aneurysm ([place])    Brain - has checked every 6 months  . Exercise-induced shortness of breath    "sometimes at the gym" (01/30/2012)  . No pertinent past medical history   . Status post vacuum-assisted vaginal delivery 11/29/2016  . TIA (transient ischemic attack) 01/14    Patient Active Problem List   Diagnosis Date Noted  . Brain aneurysm 01/13/2017  . TIA (transient ischemic attack) 01/29/2012  . Hemiplegia affecting right dominant side ([place]) 01/29/2012    Past Surgical History:  Procedure Laterality Date  . NO PAST SURGERIES    . TEE WITHOUT CARDIOVERSION  01/31/2012   Procedure: TRANSESOPHAGEAL ECHOCARDIOGRAM (TEE);  Surgeon: Lelon Perla, MD;  Location: [facility] ENDOSCOPY;  Service: Cardiovascular;  Laterality: N/A;    OB History    Gravida Para Term Preterm AB Living   1 1 1     1    SAB TAB Ectopic Multiple Live Births         0 1       Home Medications    Prior to Admission medications   Medication Sig Start Date End Date Taking? Authorizing Provider  Prenatal Vit-Fe Fumarate-FA (PRENATAL PO) Take 3 tablets by mouth at bedtime.    Yes  [provider]  medroxyPROGESTERone (DEPO-PROVERA) 150 MG/ML injection Inject 1 mL (150 mg total) into the muscle every 3 (three) months. Patient not taking: Reported on 01/25/2017 01/13/17   Roma Schanz, CNM    Family History Family History  Problem Relation Age of Onset  . Diabetes Mother   . Diabetes Paternal Uncle     Social History Social History   Tobacco Use  . Smoking status: Never Smoker  . Smokeless tobacco: Never Used  Substance Use Topics  . Alcohol use: No    Alcohol/week: 0.6 oz    Types: 1 Shots of liquor per week    Frequency: Never    Comment: 01/30/2012 "3 drinks a month"  . Drug use: No     Allergies   Patient has no known allergies.   Review of Systems Review of Systems  Constitutional: Negative for appetite change and fatigue.  HENT: Negative for congestion, ear discharge and sinus pressure.   Eyes: Negative for discharge.  Respiratory: Negative for cough.   Cardiovascular: Negative for chest pain.  Gastrointestinal: Negative for abdominal pain and diarrhea.  Genitourinary: Negative for frequency and hematuria.  Musculoskeletal: Negative for back pain.  Skin: Negative for rash.  Neurological: Negative for seizures and headaches.  Psychiatric/Behavioral: Positive for depression and dysphoric mood. Negative for hallucinations.     Physical Exam Updated  Vital Signs BP (!) 155/87 (BP Location: Right Arm)   Pulse 77   Temp 98.2 F (36.8 C) (Oral)   Resp 18   SpO2 98%   Physical Exam  Constitutional: She is oriented to person, place, and time. She appears well-developed.  HENT:  Head: Normocephalic.  Eyes: Conjunctivae and EOM are normal. No scleral icterus.  Neck: Neck supple. No thyromegaly present.  Cardiovascular: Normal rate and regular rhythm. Exam reveals no gallop and no friction rub.  No murmur heard. Pulmonary/Chest: No stridor. She has no wheezes. She has no rales. She exhibits no tenderness.  Abdominal: She  exhibits no distension. There is no tenderness. There is no rebound.  Musculoskeletal: Normal range of motion. She exhibits no edema.  Lymphadenopathy:    She has no cervical adenopathy.  Neurological: She is oriented to person, place, and time. She exhibits normal muscle tone. Coordination normal.  Skin: No rash noted. No erythema.  Psychiatric:  Depressed not suicidal     ED Treatments / Results  Labs (all labs ordered are listed, but only abnormal results are displayed) Labs Reviewed  CBC WITH DIFFERENTIAL/PLATELET - Abnormal; Notable for the following components:      Result Value   RBC 5.27 (*)    All other components within normal limits  BASIC METABOLIC PANEL - Abnormal; Notable for the following components:   Potassium 3.3 (*)    All other components within normal limits  RAPID URINE DRUG SCREEN, HOSP PERFORMED  PREGNANCY, URINE  ETHANOL    EKG  EKG Interpretation None       Radiology No results found.  Procedures Procedures (including critical care time)  Medications Ordered in ED Medications - No data to display   Initial Impression / Assessment and Plan / ED Course  I have reviewed the triage vital signs and the nursing notes.  Pertinent labs & imaging results that were available during my care of the patient were reviewed by me and considered in my medical decision making (see chart for details).     The patient was seen by behavioral health and they feel like she needs inpatient treatment but does not warrant being committed. the patient is planning to be admitted voluntarily  Final Clinical Impressions(s) / ED Diagnoses   Final diagnoses:  None    ED Discharge Orders    None       Milton Ferguson, MD 01/25/17 2214

## 2017-01-25 NOTE — ED Triage Notes (Signed)
Pt has baby 2 months old. Manus Gunning from [facility] called and stated pt and family worried due to pt wanting to die. Pt states did not want to kill herself but she just did not want to exist. Pt cooperative. Polite. States that feeling came out of no where and just scared. Denies hi/avh.

## 2017-01-25 NOTE — Progress Notes (Signed)
[facility] Visit  Patient name: Shelby Cunningham MRN [id number]  Date of birth: 1985-07-06  CC & HPI:  Shelby Cunningham is a 32 y.o.  female presenting today for having persistent suicidal ideations since yesterday. Pt is 7 weeks postpartum following an uncomplicated vaginal birth. Was going to [facility], but was discharged from the practice after delivery due to pt losing her private insurance and getting Medicaid.  Pt was seen here 12/28 for her PP check up,  Scored a 0 on the [place] test. Received Depo that same day. No hx of depression.   Shelby Cunningham states that while driving to [employer] yesterday, she suddenly had a "panic attack" w/tachycardia, SOB and intrusive thoughts of harming herself . Her physical sx improved shortly, but she has not been able to shake the thoughts of self-harm.  Is here today because she is "terrified I will do something to myself"  Husband is home with the baby. Discussed acute treatment and pt is very receptive.   Pertinent History Reviewed:  Medical & Surgical Hx:   Past Medical History:  Diagnosis Date  . Aneurysm ([place])    Brain - has checked every 6 months  . Exercise-induced shortness of breath    "sometimes at the gym" (01/30/2012)  . No pertinent past medical history   . Status post vacuum-assisted vaginal delivery 11/29/2016  . TIA (transient ischemic attack) 01/14   Past Surgical History:  Procedure Laterality Date  . NO PAST SURGERIES    . TEE WITHOUT CARDIOVERSION  01/31/2012   Procedure: TRANSESOPHAGEAL ECHOCARDIOGRAM (TEE);  Surgeon: Lelon Perla, MD;  Location: [facility] ENDOSCOPY;  Service: Cardiovascular;  Laterality: N/A;   Family History  Problem Relation Age of Onset  . Diabetes Mother   . Diabetes Paternal Uncle     Current Outpatient Medications:  .  medroxyPROGESTERone (DEPO-PROVERA) 150 MG/ML injection, Inject 1 mL (150 mg total) into the muscle every 3 (three) months., Disp: 1 mL, Rfl: 3 .  Prenatal Vit-Fe Fumarate-FA  (MULTIVITAMIN-PRENATAL) 27-0.8 MG TABS tablet, Take 1 tablet by mouth daily at 12 noon., Disp: , Rfl:  Social History: Reviewed -  reports that  has never smoked. she has never used smokeless tobacco.  Review of Systems:   Constitutional: Negative for fever and chills Eyes: Negative for visual disturbances Respiratory: Negative for shortness of breath, dyspnea Cardiovascular: Negative for chest pain or palpitations  Gastrointestinal: Negative for vomiting, diarrhea and constipation; no abdominal pain Genitourinary: Negative for dysuria and urgency, vaginal irritation or itching Musculoskeletal: Negative for back pain, joint pain, myalgias  Neurological: Negative for dizziness and headaches    Objective Findings:    Physical Examination: General appearance - well appearing, and in no distress Mental status - alert, oriented to person, place, and time Chest:  Normal respiratory effort Heart - normal rate and regular rhythm Abdomen:  Soft, nontender Musculoskeletal:  Normal range of motion without pain Extremities:  No edema    No results found for this or any previous visit (from the past 24 hour(s)).    Assessment & Plan:  A:   Suicidal ideations P:  [facility] ED notified; Pt escorted across street by L. Cresenzo, RN to ED for ACT team evaluation. Pt agreeable and appreciative of plan.   No Follow-up on file.  CRESENZO-DISHMAN,Teaira Croft CNM 01/25/2017 3:14 PM

## 2017-01-26 ENCOUNTER — Inpatient Hospital Stay (HOSPITAL_COMMUNITY)
Admission: AD | Admit: 2017-01-26 | Discharge: 2017-01-29 | DRG: 885 | Disposition: A | Discharge status: Home / Self Care | Payer: Medicaid Other | Source: Intra-hospital | Attending: Psychiatry | Admitting: Psychiatry

## 2017-01-26 ENCOUNTER — Encounter (HOSPITAL_COMMUNITY): Payer: Self-pay | Admitting: Nurse Practitioner

## 2017-01-26 DIAGNOSIS — Z8679 Personal history of other diseases of the circulatory system: Secondary | ICD-10-CM

## 2017-01-26 DIAGNOSIS — F419 Anxiety disorder, unspecified: Secondary | ICD-10-CM | POA: Diagnosis present

## 2017-01-26 DIAGNOSIS — I69851 Hemiplegia and hemiparesis following other cerebrovascular disease affecting right dominant side: Secondary | ICD-10-CM | POA: Diagnosis not present

## 2017-01-26 DIAGNOSIS — F322 Major depressive disorder, single episode, severe without psychotic features: Principal | ICD-10-CM | POA: Diagnosis present

## 2017-01-26 DIAGNOSIS — Z818 Family history of other mental and behavioral disorders: Secondary | ICD-10-CM | POA: Diagnosis not present

## 2017-01-26 DIAGNOSIS — G4700 Insomnia, unspecified: Secondary | ICD-10-CM | POA: Diagnosis present

## 2017-01-26 DIAGNOSIS — R45851 Suicidal ideations: Secondary | ICD-10-CM | POA: Diagnosis present

## 2017-01-26 DIAGNOSIS — F332 Major depressive disorder, recurrent severe without psychotic features: Secondary | ICD-10-CM | POA: Diagnosis not present

## 2017-01-26 DIAGNOSIS — R51 Headache: Secondary | ICD-10-CM | POA: Diagnosis not present

## 2017-01-26 HISTORY — DX: Major depressive disorder, single episode, severe without psychotic features: F32.2

## 2017-01-26 MED ORDER — POTASSIUM CHLORIDE CRYS ER 20 MEQ PO TBCR
20.0000 meq | EXTENDED_RELEASE_TABLET | Freq: Once | ORAL | Status: AC
  Administered 2017-01-26: 20 meq via ORAL
  Filled 2017-01-26 (×2): qty 1

## 2017-01-26 MED ORDER — ALUM & MAG HYDROXIDE-SIMETH 200-200-20 MG/5ML PO SUSP: 30.0000 mL | ORAL | Status: DC | PRN

## 2017-01-26 MED ORDER — MAGNESIUM HYDROXIDE 400 MG/5ML PO SUSP: 30.0000 mL | Freq: Every day | ORAL | Status: DC | PRN

## 2017-01-26 MED ORDER — TRAZODONE HCL 50 MG PO TABS
50.0000 mg | ORAL_TABLET | Freq: Every evening | ORAL | Status: DC | PRN
  Filled 2017-01-26 (×10): qty 1

## 2017-01-26 MED ORDER — ACETAMINOPHEN 325 MG PO TABS: 650.0000 mg | ORAL_TABLET | Freq: Four times a day (QID) | ORAL | Status: DC | PRN

## 2017-01-26 NOTE — Progress Notes (Addendum)
Patient ID: Shelby Cunningham, female   DOB: 1985-03-07, 32 y.o.   MRN: [id number] Patient is a 31 year old female who arrived to [facility] from [place] voluntarily at [phone].  Pt reports that she gave birth 2 months ago to a baby boy, but has been within the past week having: "visions of dropping my baby to the ground, and harming myself".  Pt also reports that these thoughts have been recurrent, which have led to her being very scared.  Pt tearful during RN assessment, states that she loves her baby too much, and it hurts that she is having these thoughts. Pt added that the last time she had suicidal thoughts was yesterday, and also denies auditory hallucinations.  Pt reports that she told her husband, who is supportive, and told her Ob/Gyn Physician, who walked her to [facility].    Pt denies having any mental health history prior to the birth of her son, and lists her medical history as a TIA, and a brain aneurysm when she was 32years old, which have since been resolved.  Pt denies the use of any substances of abuse, partly blames her Depo Provera shot for the thoughts, and visions of wanting to hurt herself and her baby.  Pt states that she "took the shot couple of years ago, and felt very anxious, and took it again last week, and felt like I felt back then".    Pt oriented to unit, her unit and unit rules and protocols, verbalizes understanding, Q15 minute checks for safety initiated, V/S taken and WNL, will continue to monitor.

## 2017-01-26 NOTE — ED Notes (Signed)
Called [facility] for transfer of Pt to [facility] at 1240.

## 2017-01-26 NOTE — Progress Notes (Signed)
Nursing Progress Note [id number]  D) Patient reports she is adjusting to the milieu with no concerns at this time. Patient has an animated affect this evening and is pleasant with Probation officer. Patient did attend group. Patient is seen interactive in the milieu. Patient denies SI/HI/AVH or pain at this time. Patient contracts for safety on the unit. Patient provided Potassium supplement per NP order.  A) Emotional support given. 1:1 interaction and active listening provided. Patient medicated as prescribed. Medications and plan of care reviewed with patient. Patient verbalized understanding without further questions. Snacks and fluids provided. Opportunities for questions or concerns presented to patient. Patient encouraged to continue to work on treatment goals. Labs, vital signs and patient behavior monitored throughout shift. Patient safety maintained with q15 min safety checks. Low fall risk precautions in place and reviewed with patient; patient verbalized understanding.  R) Patient receptive to interaction with nurse. Patient remains safe on the unit at this time. Patient denies any adverse medication reactions at this time. Patient is resting in bed without complaints. Will continue to monitor.

## 2017-01-26 NOTE — BHH Group Notes (Signed)
[place] LCSW Group Therapy Note  Date/Time: 01/26/17, 1315  Type of Therapy/Topic:  Group Therapy:  Balance in Life  Participation Level:  Did not attend  Description of Group:    This group will address the concept of balance and how it feels and looks when one is unbalanced. Patients will be encouraged to process areas in their lives that are out of balance, and identify reasons for remaining unbalanced. Facilitators will guide patients utilizing problem- solving interventions to address and correct the stressor making their life unbalanced. Understanding and applying boundaries will be explored and addressed for obtaining  and maintaining a balanced life. Patients will be encouraged to explore ways to assertively make their unbalanced needs known to significant others in their lives, using other group members and facilitator for support and feedback.  Therapeutic Goals: 1. Patient will identify two or more emotions or situations they have that consume much of in their lives. 2. Patient will identify signs/triggers that life has become out of balance:  3. Patient will identify two ways to set boundaries in order to achieve balance in their lives:  4. Patient will demonstrate ability to communicate their needs through discussion and/or role plays  Summary of Patient Progress:          Therapeutic Modalities:   Cognitive Behavioral Therapy Solution-Focused Therapy Assertiveness Training  Lurline Idol, LCSW

## 2017-01-26 NOTE — Progress Notes (Signed)
Per Angelina Ok , [facility], patient has been accepted to [facility], bed 400-1 ; Accepting provider is Earleen Newport, NP; Attending provider is Dr. Parke Poisson.  Patient can arrive at 1:00pm-1:30pm. Number for report is [phone].   Angelina Ok, [facility] notified the patient's nurse.     Radonna Ricker, MSW, [place] Clinical Social Worker (Disposition) [facility]  [phone]

## 2017-01-27 DIAGNOSIS — R45851 Suicidal ideations: Secondary | ICD-10-CM

## 2017-01-27 DIAGNOSIS — R51 Headache: Secondary | ICD-10-CM

## 2017-01-27 DIAGNOSIS — F332 Major depressive disorder, recurrent severe without psychotic features: Secondary | ICD-10-CM

## 2017-01-27 DIAGNOSIS — F419 Anxiety disorder, unspecified: Secondary | ICD-10-CM

## 2017-01-27 DIAGNOSIS — Z818 Family history of other mental and behavioral disorders: Secondary | ICD-10-CM

## 2017-01-27 MED ORDER — LORAZEPAM 0.5 MG PO TABS: 0.5000 mg | ORAL_TABLET | Freq: Four times a day (QID) | ORAL | Status: DC | PRN

## 2017-01-27 MED ORDER — ESCITALOPRAM OXALATE 5 MG PO TABS
5.0000 mg | ORAL_TABLET | Freq: Every day | ORAL | Status: DC
  Administered 2017-01-27 – 2017-01-29 (×3): 5 mg via ORAL
  Filled 2017-01-27 (×7): qty 1

## 2017-01-27 NOTE — BHH Counselor (Signed)
Adult Comprehensive Assessment  Patient ID: Shelby Cunningham, female   DOB: [birth year], 32 y.o.   MRN: [id number]  Information Source: Information source: Patient  Current Stressors:  Family Relationships: Patient is a new mom  58 months old baby boy.  Adusting to motherhood  Living/Environment/Situation:  Living Arrangements: Spouse/significant other Living conditions (as described by patient or guardian): Patient lives in [place] with her husband and other family.  Very supportive and stable home situation.  Patient plans to return home at discharge How long has patient lived in current situation?: last few years What is atmosphere in current home: Loving, Comfortable, Supportive  Family History:  Marital status: Married What types of issues is patient dealing with in the relationship?: new parents, adjusting  Does patient have children?: Yes How many children?: 1 How is patient's relationship with their children?: baby: [name]  2 months old.  Patient is happy to be a mom and loves her son, but dealing with PPA and intrusive thoughts.    Childhood History:  By whom was/is the patient raised?: Mother, Father Additional childhood history information: Patient was born in [place] and lived in several states in the [place], but settled in [place] area.  Description of patient's relationship with caregiver when they were a child: Very supportive upbrining and family.  Patient reports no issues growing up Patient's description of current relationship with people who raised him/her: Very strong. Both mother and father involved in care for patient and her new baby. Does patient have siblings?: Yes Number of Siblings: 3 Description of patient's current relationship with siblings: 2 brothers and a sister.  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 Currently a student?: No Learning disability?: No  Employment/Work Situation:   Employment situation: Unemployed Patient's job has been impacted by current illness: No What is the longest time patient has a held a job?: 8 months, patient was working at [employer] during pregnancy Where was the patient employed at that time?: She is now going to stay home with baby Has patient ever been in the [employer]?: No Has patient ever served in combat?: No Did You Receive Any Psychiatric Treatment/Services While in Passenger transport manager?: No Are There Guns or Other Weapons in [place]?: No Are These Psychologist, educational?: Yes  Financial Resources:   Museum/gallery curator resources: Support from parents / caregiver, Income from spouse, Medicaid Does patient have a Programmer, applications or guardian?: No  Alcohol/Substance Abuse:   What has been your use of drugs/alcohol within the last 12 months?: none If attempted suicide, did drugs/alcohol play a role in this?: No Alcohol/Substance Abuse Treatment Hx: Denies past history Has alcohol/substance abuse ever caused legal problems?: No  Social Support System:   Pensions consultant Support System: Good Describe Community Support System: Strong support from family and husband Type of faith/religion: Darrick Meigs How does patient's faith help to cope with current illness?: Believes in God, Has hope and prayer  Leisure/Recreation:   Leisure and Hobbies: reading, helping with IT questions and computers  Strengths/Needs:   What things does the patient do well?: Self awareness, motivation  to get help In what areas does patient struggle / problems for patient:  intrusive thoughts  Discharge Plan:   Does patient have access to transportation?: Yes Will patient be returning to same living situation after discharge?: Yes Currently receiving community mental health services: No If no, would patient like referral for services when discharged?: Yes (What county?)([facility]) Does patient have financial barriers related to discharge medications?: No  Summary/Recommendations:   Summary and Recommendations (to be completed by the evaluator): Shelby Cunningham is an 32 y.o. female presents to [facility] voluntarily. Pt reports she has a 23 month old. Pt reports she had a panic attack yesterday with thoughts of harming herself and fears of harming her newborn. Pt reports she is having "thoughts or visions of dropping her baby". Pt denies hx of depression or SI. Pt denies homicidal thoughts or physical aggression. Pt denies having access to firearms. Pt denies having any legal problems at this time. Pt denies hallucinations. Pt does not appear to be responding to internal stimuli and exhibits no delusional thought. Pt's reality testing appears to be intact. Pt denies any current or past substance abuse problems. Pt does not appear to be intoxicated or in withdrawal at this time. Pt lives with her husband and child. Pt denies hx of mental health up until this episode.    Patient is open to therapy and medication.  She processes her current intrusive thoughts related to dropping the baby and fears of not being and adequate mom.  Patient shows insight and self awareness of thoughts and working to address and cope.  She plans to begin therapy and wants to try medication to help stabilize.  She processes additional coping skills related to PPA and support she has at home when she is ready to discharge.   Shelby Cunningham 01/27/2017

## 2017-01-27 NOTE — Plan of Care (Signed)
Patient verbalizes understanding of information, education provided. 

## 2017-01-27 NOTE — Tx Team (Signed)
Interdisciplinary Treatment and Diagnostic Plan Update  01/27/2017 Time of Session: [place] MRN: [id number]  Principal Diagnosis: <principal problem not specified>  Secondary Diagnoses: Active Problems:   MDD (major depressive disorder), severe (HCC)   Current Medications:  Current Facility-Administered Medications  Medication Dose Route Frequency Provider Last Rate Last Dose  . acetaminophen (TYLENOL) tablet 650 mg  650 mg Oral Q6H PRN Rankin, Shuvon B, NP      . alum & mag hydroxide-simeth (MAALOX/MYLANTA) 200-200-20 MG/5ML suspension 30 mL  30 mL Oral Q4H PRN Rankin, Shuvon B, NP      . magnesium hydroxide (MILK OF MAGNESIA) suspension 30 mL  30 mL Oral Daily PRN Rankin, Shuvon B, NP      . traZODone (DESYREL) tablet 50 mg  50 mg Oral QHS,MR X 1 Lindon Romp A, NP       PTA Medications: Medications Prior to Admission  Medication Sig Dispense Refill Last Dose  . Prenatal Vit-Fe Fumarate-FA (PRENATAL PO) Take 3 tablets by mouth at bedtime.    01/24/2017 at Unknown time    Patient Stressors: Other: 2 months past partum  Patient Strengths: Ability for insight Active sense of humor Average or above average intelligence Capable of independent living Occupational psychologist fund of knowledge Motivation for treatment/growth Physical Health Supportive family/friends  Treatment Modalities: Medication Management, Group therapy, Case management,  1 to 1 session with clinician, Psychoeducation, Recreational therapy.   Physician Treatment Plan for Primary Diagnosis: <principal problem not specified> Long Term Goal(s):     Short Term Goals:    Medication Management: Evaluate patient's response, side effects, and tolerance of medication regimen.  Therapeutic Interventions: 1 to 1 sessions, Unit Group sessions and Medication administration.  Evaluation of Outcomes: Not Met  Physician Treatment Plan for Secondary Diagnosis: Active Problems:   MDD  (major depressive disorder), severe ([place])  Long Term Goal(s):     Short Term Goals:       Medication Management: Evaluate patient's response, side effects, and tolerance of medication regimen.  Therapeutic Interventions: 1 to 1 sessions, Unit Group sessions and Medication administration.  Evaluation of Outcomes: Not Met   RN Treatment Plan for Primary Diagnosis: <principal problem not specified> Long Term Goal(s): Knowledge of disease and therapeutic regimen to maintain health will improve  Short Term Goals: Ability to identify and develop effective coping behaviors will improve and Compliance with prescribed medications will improve  Medication Management: RN will administer medications as ordered by provider, will assess and evaluate patient's response and provide education to patient for prescribed medication. RN will report any adverse and/or side effects to prescribing provider.  Therapeutic Interventions: 1 on 1 counseling sessions, Psychoeducation, Medication administration, Evaluate responses to treatment, Monitor vital signs and CBGs as ordered, Perform/monitor CIWA, COWS, AIMS and Fall Risk screenings as ordered, Perform wound care treatments as ordered.  Evaluation of Outcomes: Not Met   LCSW Treatment Plan for Primary Diagnosis: <principal problem not specified> Long Term Goal(s): Safe transition to appropriate next level of care at discharge, Engage patient in therapeutic group addressing interpersonal concerns.  Short Term Goals: Engage patient in aftercare planning with referrals and resources, Increase social support and Increase skills for wellness and recovery  Therapeutic Interventions: Assess for all discharge needs, 1 to 1 time with Social worker, Explore available resources and support systems, Assess for adequacy in community support network, Educate family and significant other(s) on suicide prevention, Complete Psychosocial Assessment, Interpersonal group  therapy.  Evaluation of Outcomes: Not Met  Progress in Treatment: Attending groups: No. Participating in groups: No. Taking medication as prescribed: Yes. Toleration medication: Yes. Family/Significant other contact made: No, will contact:  pt declined Patient understands diagnosis: Yes. Discussing patient identified problems/goals with staff: Yes. Medical problems stabilized or resolved: Yes. Denies suicidal/homicidal ideation: Yes. Issues/concerns per patient self-inventory: No. Other: none  New problem(s) identified: No, Describe:  none  New Short Term/Long Term Goal(s):  Discharge Plan or Barriers:   Reason for Continuation of Hospitalization: Depression Medication stabilization  Estimated Length of Stay: 3-5 days.  Attendees: Patient: 01/27/2017   Physician: Dr Parke Poisson, MD 01/27/2017   Nursing: Loletta Specter, RN 01/27/2017   RN Care Manager: 01/27/2017   Social Worker: Lurline Idol, LCSW 01/27/2017   Recreational Therapist:  01/27/2017   Other:  01/27/2017   Other:  01/27/2017   Other: 01/27/2017     Scribe for Treatment Team: Joanne Chars, [place] 01/27/2017 2:50 PM

## 2017-01-27 NOTE — BHH Suicide Risk Assessment (Signed)
[facility] Admission Suicide Risk Assessment   Nursing information obtained from:   patient and chart  Demographic factors:   32 year old married female, lives with husband, brother, child Current Mental Status:   see below Loss Factors:   2 months post partum  Historical Factors:   denies prior psychiatric admissions, denies history of suicidal attempts  Risk Reduction Factors:   resilience, support , sense of responsibility and love for her child  Total Time spent with patient: 45 minutes Principal Problem: Suicidal Ideations Diagnosis:   Patient Active Problem List   Diagnosis Date Noted  . MDD (major depressive disorder), severe ([place]) [F32.2] 01/26/2017  . Brain aneurysm [I67.1] 01/13/2017  . TIA (transient ischemic attack) [G45.9] 01/29/2012  . 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  World Pharmacologist [facility]). Score between 0-7:  no or low risk or alcohol related problems. Score between 8-15:  moderate risk of alcohol related problems. Score between 16-19:  high risk of alcohol related problems. Score 20 or above:  warrants further diagnostic evaluation for alcohol dependence and treatment.   CLINICAL FACTORS:  32 year old married female S/P partum x 2 months ( full term, 2 month infant son is healthy). Reports recent onset of ego-dystonic thoughts and images of dropping her child and hurting self . Denies any actual plan or intention of hurting either her child or herself, denies feeling depressed, denies psychotic symptoms, but describes significant anxiety related to having these thoughts .   Psychiatric Specialty Exam: Physical Exam  ROS  Blood pressure 128/83, pulse 96, temperature 98.7 F (37.1 C), temperature source Oral, resp. rate 18, height 5'  6" (1.676 m), weight 83.9 kg (185 lb), SpO2 100 %, not currently breastfeeding.Body mass index is 29.86 kg/m.  See admit note MSE    COGNITIVE FEATURES THAT CONTRIBUTE TO RISK:  Closed-mindedness and Loss of executive function    SUICIDE RISK:   Moderate:  Frequent suicidal ideation with limited intensity, and duration, some specificity in terms of plans, no associated intent, good self-control, limited dysphoria/symptomatology, some risk factors present, and identifiable protective factors, including available and accessible social support.  PLAN OF CARE: Patient will be admitted to inpatient psychiatric unit for stabilization and safety. Will provide and encourage milieu participation. Provide medication management and maked adjustments as needed.  Will follow daily.    I certify that inpatient services furnished can reasonably be expected to improve the patient's condition.   Jenne Campus, MD 01/27/2017, 4:01 PM

## 2017-01-27 NOTE — BHH Suicide Risk Assessment (Signed)
[facility] [facility] INPATIENT:  Family/Significant Other Suicide Prevention Education  Suicide Prevention Education:  Patient Refusal for Family/Significant Other Suicide Prevention Education: The patient Shelby Cunningham has refused to provide written consent for family/significant other to be provided Family/Significant Other Suicide Prevention Education during admission and/or prior to discharge.  Physician notified.  Lilly Cove 01/27/2017, 2:22 PM

## 2017-01-27 NOTE — Progress Notes (Signed)
Pt reports she had a good day, and hopes to be discharged tomorrow or Sunday.  She denies having SI/HI/AVH.  She says she misses her family and is no longer having any thoughts to harm her baby.  She says she will continue taking the medication prescribed to her while she was here.  She is observed sitting in the dayroom, coloring and watching TV.  Pt is pleasant and cooperative with staff.  She makes her needs known to staff.  Support and encouragement offered.  Discharge plans are in process.  Pt plans to return home at discharge.  Safety maintained with q15 minute checks.

## 2017-01-27 NOTE — H&P (Signed)
Psychiatric Admission Assessment Adult  Patient Identification: Shelby Cunningham MRN:  [id number] Date of Evaluation:  01/27/2017 Chief Complaint:  " I don't want to have these thoughts " Principal Diagnosis:  Suicidal Ideations  Diagnosis:   Patient Active Problem List   Diagnosis Date Noted  . MDD (major depressive disorder), severe ([place]) [F32.2] 01/26/2017  . Brain aneurysm [I67.1] 01/13/2017  . TIA (transient ischemic attack) [G45.9] 01/29/2012  . Hemiplegia affecting right dominant side ([place]) [G81.91] 01/29/2012   History of Present Illness: 32 year old married female, has two month old boy ( full term, states pregnancy was uneventful, states child is healthy).  Presented to ED voluntarily reporting " visions of dropping my baby and then hurting myself" . Denies any actual hallucinations, and describes as visual images , states " it's a thought, not a hallucination or anything like that". Identifies these thoughts as intrusive and ego dystonic and states she has no actual desire to hurt her child or herself and has a deep sense of love towards her child.  States that after giving birth to her child had mild depression, " blues", but feels that mood  has improved and she denies any major neuro-vegetative symptoms of depression ( see below)  . States she has not been feeling depressed recently . Endorses anxiety, which she attributes to ego dystonic thoughts, images .  Associated Signs/Symptoms: Depression Symptoms:  anxiety, (Hypo) Manic Symptoms: does not endorse  Anxiety Symptoms:  Reports significant anxiety Psychotic Symptoms:  Denies  PTSD Symptoms: Denies  Total Time spent with patient: 45 minutes  Past Psychiatric History: denies prior psychiatric admissions, denies prior history of cutting or history of suicide attempts, denies history of violence, denies history of impulsivity,  denies history of psychosis, no history of severe depression or of mania in the past . Denies PTSD ,  denies history of OCD.  Is the patient at risk to self? Yes.    Has the patient been a risk to self in the past 6 months? No.  Has the patient been a risk to self within the distant past? No.  Is the patient a risk to others? No.  Has the patient been a risk to others in the past 6 months? No.  Has the patient been a risk to others within the distant past? No.   Prior Inpatient Therapy:  denies  Prior Outpatient Therapy:  denies   Alcohol Screening: Patient refused Alcohol Screening Tool: Yes 1. How often do you have a drink containing alcohol?: Never 2. How many drinks containing alcohol do you have on a typical day when you are drinking?: 1 or 2 3. How often do you have six or more drinks on one occasion?: Never AUDIT-C Score: 0 9. Have you or someone else been injured as a result of your drinking?: No 10. Has a relative or friend or a doctor or another health worker been concerned about your drinking or suggested you cut down?: No Alcohol Use Disorder Identification Test Final Score (AUDIT): 0 Intervention/Follow-up: AUDIT Score <7 follow-up not indicated Substance Abuse History in the last 12 months:  Denies alcohol or drug abuse  Consequences of Substance Abuse: Denies  Previous Psychotropic Medications: has not been taking any psychiatric medications  Psychological Evaluations:  No Past Medical History: as below- of note, states she is not breast feeling  Past Medical History:  Diagnosis Date  . Aneurysm ([place])    Brain - has checked every 6 months  . Exercise-induced shortness of breath    "  sometimes at the gym" (01/30/2012)  . No pertinent past medical history   . Status post vacuum-assisted vaginal delivery 11/29/2016  . TIA (transient ischemic attack) 01/14    Past Surgical History:  Procedure Laterality Date  . NO PAST SURGERIES    . TEE WITHOUT CARDIOVERSION  01/31/2012   Procedure: TRANSESOPHAGEAL ECHOCARDIOGRAM (TEE);  Surgeon: Lelon Perla, MD;  Location: [facility]  ENDOSCOPY;  Service: Cardiovascular;  Laterality: N/A;   Family History: parents are alive, live together, supportive , has two brothers and one sister  Family History  Problem Relation Age of Onset  . Diabetes Mother   . Diabetes Paternal Uncle    Family Psychiatric  History: brother has history of depression and self cutting.  Tobacco Screening: Have you used any form of tobacco in the last 30 days? (Cigarettes, Smokeless Tobacco, Cigars, and/or Pipes): No Social History: married, has a two month child, not currently employed, lives with husband, brother, child. Social History   Substance and Sexual Activity  Alcohol Use No  . Alcohol/week: 0.6 oz  . Types: 1 Shots of liquor per week  . Frequency: Never   Comment: 01/30/2012 "3 drinks a month"     Social History   Substance and Sexual Activity  Drug Use No    Additional Social History: Marital status: Married What types of issues is patient dealing with in the relationship?: new parents, adjusting  Does patient have children?: Yes How many children?: 1 How is patient's relationship with their children?: baby: [name]  2 months old.  Patient is happy to be a mom and loves her son, but dealing with PPA and intrusive thoughts.    Allergies:  No Known Allergies Lab Results:  Results for orders placed or performed during the hospital encounter of 01/25/17 (from the past 48 hour(s))  Ethanol     Status: None   Collection Time: 01/25/17  5:14 PM  Result Value Ref Range   Alcohol, Ethyl (B) <10 <10 mg/dL    Blood Alcohol level:  Lab Results  Component Value Date   ETH <10 42/59/5638    Metabolic Disorder Labs:  Lab Results  Component Value Date   HGBA1C 4.9 01/29/2013   MPG 108 01/29/2012   MPG 103 11/02/2011   No results found for: PROLACTIN Lab Results  Component Value Date   CHOL 190 01/30/2012   TRIG 93 01/30/2012   HDL 53 01/30/2012   CHOLHDL 3.6 01/30/2012   VLDL 19 01/30/2012   LDLCALC 118 (H) 01/30/2012     Current Medications: Current Facility-Administered Medications  Medication Dose Route Frequency Provider Last Rate Last Dose  . acetaminophen (TYLENOL) tablet 650 mg  650 mg Oral Q6H PRN Rankin, Shuvon B, NP      . alum & mag hydroxide-simeth (MAALOX/MYLANTA) 200-200-20 MG/5ML suspension 30 mL  30 mL Oral Q4H PRN Rankin, Shuvon B, NP      . magnesium hydroxide (MILK OF MAGNESIA) suspension 30 mL  30 mL Oral Daily PRN Rankin, Shuvon B, NP      . traZODone (DESYREL) tablet 50 mg  50 mg Oral QHS,MR X 1 Lindon Romp A, NP       PTA Medications: Medications Prior to Admission  Medication Sig Dispense Refill Last Dose  . Prenatal Vit-Fe Fumarate-FA (PRENATAL PO) Take 3 tablets by mouth at bedtime.    01/24/2017 at Unknown time    Musculoskeletal: Strength & Muscle Tone: within normal limits Gait & Station: normal Patient leans: N/A  Psychiatric Specialty Exam:  Physical Exam  Review of Systems  Constitutional: Negative.   HENT: Negative.   Eyes: Negative.   Respiratory: Negative.   Cardiovascular: Negative.   Gastrointestinal: Negative.   Genitourinary: Negative.   Musculoskeletal: Negative.   Skin: Negative.   Neurological: Positive for headaches. Negative for tingling, focal weakness and seizures.  Endo/Heme/Allergies: Negative.   Psychiatric/Behavioral: Positive for suicidal ideas.  All other systems reviewed and are negative.   Blood pressure 128/83, pulse 96, temperature 98.7 F (37.1 C), temperature source Oral, resp. rate 18, height 5\' 6"  (1.676 m), weight 83.9 kg (185 lb), SpO2 100 %, not currently breastfeeding.Body mass index is 29.86 kg/m.  General Appearance: Neat  Eye Contact:  Good  Speech:  Normal Rate  Volume:  Normal  Mood:  denies feeling depressed, states mood 8/10  Affect:  report some anxiety , reactive   Thought Process:  Linear and Descriptions of Associations: Intact  Orientation:  Other:  fully alert and attentive   Thought Content:  no  hallucinations, no delusions , not internally preoccupied   Suicidal Thoughts:  No denies any suicidal ideations, and contracts for safety on the unit . States that visual images of " dropping my baby" are ego-dystonic and categorically denies any actual plan or intention of hurting her child  Homicidal Thoughts:  No  Memory:  recent and remote grossly intact   Judgement:  Other:  present  Insight:  present   Psychomotor Activity:  Normal  Concentration:  Concentration: Good and Attention Span: Good  Recall:  Good  Fund of Knowledge:  Good  Language:  Good  Akathisia:  Negative  Handed:  Right  AIMS (if indicated):     Assets:  Communication Skills Desire for Improvement Resilience  ADL's:  Intact  Cognition:  WNL  Sleep:  Number of Hours: 5.75    Treatment Plan Summary: Daily contact with patient to assess and evaluate symptoms and progress in treatment, Medication management, Plan inpatient admission and medications as below  Observation Level/Precautions:  15 minute checks  Laboratory:  as needed   Psychotherapy:  Milieu, group therapy   Medications:  We discussed options- although denies depression, does endorse anxiety, and obsessive type symptoms as above . Agrees to SSRI trial. Start Lexapro 5 mgrs QDAY.   Consultations:  As needed   Discharge Concerns:  3-4 days   Estimated LOS: 5 days   Other:     Physician Treatment Plan for Primary Diagnosis: Long Term Goal(s):  Suicidal Ideations   Short Term Goals: Ability to identify changes in lifestyle to reduce recurrence of condition will improve and Ability to maintain clinical measurements within normal limits will improve  Physician Treatment Plan for Secondary Diagnosis: Anxiety Disorder , consider Adjustment Disorder with Anxiety  Long Term Goal(s): Improvement in symptoms so as ready for discharge  Short Term Goals: Ability to disclose and discuss suicidal ideas, Ability to demonstrate self-control will improve,  Ability to identify and develop effective coping behaviors will improve, Ability to maintain clinical measurements within normal limits will improve, Compliance with prescribed medications will improve and Ability to identify triggers associated with substance abuse/mental health issues will improve  I certify that inpatient services furnished can reasonably be expected to improve the patient's condition.    Jenne Campus, MD 1/11/20193:50 PM

## 2017-01-27 NOTE — Progress Notes (Signed)
D: Patient observed up and visible in the milieu. Patient asked to speak to this writer in private this AM. Patient states, "I'm feeling good today. I'm not having those thoughts anymore (to harm the baby.) Being here, does that mean something is really bad wrong with me? I hear the other patients' stories."  Patient's affect animated, anxious with congruent mood. Per self inventory and discussions with writer, rates depression at a 0/10, hopelessness and anxiety both at a 3/10. Rates sleep as good, appetite as fair, energy as normal and concentration as poor.  States goal for today is "figuring out exactly what is wrong with me, talk to doctors." Denies pain, physical complaints.   A: No medications ordered at present. No prns requested or required. Level III obs in place for safety. Emotional support offered and self inventory reviewed. Encouraged completion of Suicide Safety Plan and programming participation. Discussed POC with MD, SW.   R: Patient verbalizes understanding of POC. Patient denies SI/HI/AVH and remains safe on level III obs. Will continue to monitor closely and make verbal contact frequently.

## 2017-01-27 NOTE — Progress Notes (Signed)
Adult Psychoeducational Group Note  Date:  01/27/2017 Time:  8:31 PM  Group Topic/Focus:  Wrap-Up Group:   The focus of this group is to help patients review their daily goal of treatment and discuss progress on daily workbooks.  Participation Level:  Active  Participation Quality:  Appropriate  Affect:  Appropriate  Cognitive:  Appropriate  Insight: Appropriate  Engagement in Group:  Engaged  Modes of Intervention:  Discussion  Additional Comments:  The patient expressed that she attended all groups.The patient also said that she had a  good day and rates today a 9.  Nash Shearer 01/27/2017, 8:31 PM

## 2017-01-27 NOTE — Progress Notes (Signed)
Recreation Therapy Notes  Date:  01/27/17 Time: 0930 Location: [address] Group Room  Group Topic: Stress Management  Goal Area(s) Addresses:  Patient will verbalize importance of using healthy stress management.  Patient will identify positive emotions associated with healthy stress management.   Behavioral Response: Engaged  Intervention: Stress Management  Activity :  Progressive Muscle Relaxation.  LRT introduced the stress management technique of progressive muscle relaxation.  LRT read a script to guide patients through the technique which allowed them to tense and relax each muscle group individually.  Education:  Stress Management, Discharge Planning.   Education Outcome: Acknowledges edcuation/In group clarification offered/Needs additional education  Clinical Observations/Feedback: Pt attended group.     Victorino Sparrow, LRT/CTRS         Ria Comment, Eliannah Hinde A 01/27/2017 10:57 AM

## 2017-01-27 NOTE — B[facility] Group Notes (Signed)
  [facility] LCSW Group Therapy Note  Date/Time: 01/27/17, 1315  Type of Therapy/Topic:  Group Therapy:  Emotion Regulation  Participation Level:  Did Not Attend   Mood:  Description of Group:    The purpose of this group is to assist patients in learning to regulate negative emotions and experience positive emotions. Patients will be guided to discuss ways in which they have been vulnerable to their negative emotions. These vulnerabilities will be juxtaposed with experiences of positive emotions or situations, and patients challenged to use positive emotions to combat negative ones. Special emphasis will be placed on coping with negative emotions in conflict situations, and patients will process healthy conflict resolution skills.  Therapeutic Goals: 1. Patient will identify two positive emotions or experiences to reflect on in order to balance out negative emotions:  2. Patient will label two or more emotions that they find the most difficult to experience:  3. Patient will be able to demonstrate positive conflict resolution skills through discussion or role plays:   Summary of Patient Progress:       Therapeutic Modalities:   Cognitive Behavioral Therapy Feelings Identification Dialectical Behavioral Therapy  Lurline Idol, LCSW

## 2017-01-27 NOTE — Tx Team (Signed)
Initial Treatment Plan 01/27/2017 12:52 AM [name] LOV:[id number]    PATIENT STRESSORS: Other: 2 months past partum   PATIENT STRENGTHS: Ability for insight Active sense of humor Average or above average intelligence Capable of independent living Occupational psychologist fund of knowledge Motivation for treatment/growth Physical Health Supportive family/friends   PATIENT IDENTIFIED PROBLEMS: "feel normal again"  Suicide Risk  Self-harm thoughts                 DISCHARGE CRITERIA:  Ability to meet basic life and health needs Adequate post-discharge living arrangements Improved stabilization in mood, thinking, and/or behavior Medical problems require only outpatient monitoring Motivation to continue treatment in a less acute level of care Need for constant or close observation no longer present Reduction of life-threatening or endangering symptoms to within safe limits Safe-care adequate arrangements made Verbal commitment to aftercare and medication compliance  PRELIMINARY DISCHARGE PLAN: Outpatient therapy  PATIENT/FAMILY INVOLVEMENT: This treatment plan has been presented to and reviewed with the patient, Shelby Cunningham.  The patient and family have been given the opportunity to ask questions and make suggestions.  Annia Friendly, RN 01/27/2017, 12:52 AM

## 2017-01-28 DIAGNOSIS — G4700 Insomnia, unspecified: Secondary | ICD-10-CM

## 2017-01-28 LAB — TSH: TSH: 2.398 u[IU]/mL (ref 0.350–4.500)

## 2017-01-28 NOTE — [facility] Group Notes (Signed)
[facility] LCSW Group Therapy Note  Date/Time:    01/28/2017   10:15 - 11:15 AM  Type of Therapy and Topic:  Group Therapy:  Thought Distortions  Participation Level:  Active   Description of Group:  In this group, patients were introduced to cognitive distortions and discussed how these can negatively affect lives.  This included All or Nothing thinking, Labeling, Focusing on the Negative, the Shoulds, Blaming, Predicting the Future, Overgeneralization, Mind Reading, Catastrophizing, Emotional Reasoning, Personalization, and Jumping to Conclusions.  Patients then used a worksheet to describe an upsetting event in their life and the Automatic Thoughts they had about that event.  They identified the type of cognitive distortion they used and worked with the group to come up with a more realistic thought to refute their original Automatic Thought.  Therapeutic Goals: 1. Patient will be able to identify this exercise of examining their thoughts as a healthy coping mechanism. 2. Patient will identify a problem area in their life and the automatic thoughts they have about that situation. 3. Patient will verbalize the feelings and outcomes typically associated with their thoughts. 4. Patient will think about and discuss the available evidence supporting their thoughts, as well as assumptions they have to make to believe it. 5. Patient will identify evidence or facts that refute their cognitive distortion. 6. Patient will verbalize a more realistic, helpful conclusion.  Summary of Patient Progress:  The patient expressed the last time she was upset was 20 minutes prior when she was missing her family and feeling she should be home with them instead of in the hospital.  She was insightful into the topic and could identify her own cognitive distortions in regards to getting help before returning home and thus being in a better place to take care of her baby.   Therapeutic Modalities Cognitive Behavioral  Therapy Dialectical Behavioral Therapy  Selmer Dominion, LCSW 01/28/2017 12:00PM

## 2017-01-28 NOTE — Progress Notes (Addendum)
[facility] MD Progress Note  01/28/2017 12:02 PM Marene Gilliam  MRN:  [id number] Subjective: patient states she is doing better , and states the intrusive , disturbing visual images she had been experiencing have improved significantly and has had none today. States she had good visit from husband yesterday evening and feels visit went well, has good support system. Denies any suicidal ideations. Denies medication side effects.  Objective : I have met with patient and have reviewed chart notes . Patient reports intrusive thoughts of dropping her child and hurting self have abated, and seems less focused and less distressed about this today. States she has had none of these thoughts today. She is visible in day room. No disruptive behaviors. As she improves she presents future oriented, states she misses her family and child and is hoping for discharge soon. Denies medication side effects. TSH WNL.    Principal Problem: Suicidal Ideations Diagnosis:   Patient Active Problem List   Diagnosis Date Noted  . MDD (major depressive disorder), severe ([place]) [F32.2] 01/26/2017  . Brain aneurysm [I67.1] 01/13/2017  . TIA (transient ischemic attack) [G45.9] 01/29/2012  . Hemiplegia affecting right dominant side ([place]) [G81.91] 01/29/2012   Total Time spent with patient: 20 minutes  Past Medical History:  Past Medical History:  Diagnosis Date  . Aneurysm ([place])    Brain - has checked every 6 months  . Exercise-induced shortness of breath    "sometimes at the gym" (01/30/2012)  . No pertinent past medical history   . Status post vacuum-assisted vaginal delivery 11/29/2016  . TIA (transient ischemic attack) 01/14    Past Surgical History:  Procedure Laterality Date  . NO PAST SURGERIES    . TEE WITHOUT CARDIOVERSION  01/31/2012   Procedure: TRANSESOPHAGEAL ECHOCARDIOGRAM (TEE);  Surgeon: Lelon Perla, MD;  Location: [facility] ENDOSCOPY;  Service: Cardiovascular;  Laterality: N/A;   Family History:   Family History  Problem Relation Age of Onset  . Diabetes Mother   . Diabetes Paternal Uncle    Social History:  Social History   Substance and Sexual Activity  Alcohol Use No  . Alcohol/week: 0.6 oz  . Types: 1 Shots of liquor per week  . Frequency: Never   Comment: 01/30/2012 "3 drinks a month"     Social History   Substance and Sexual Activity  Drug Use No    Social History   Socioeconomic History  . Marital status: Married    Spouse name: None  . Number of children: None  . Years of education: None  . Highest education level: None  Social Needs  . Financial resource strain: None  . Food insecurity - worry: None  . Food insecurity - inability: None  . Transportation needs - medical: None  . Transportation needs - non-medical: None  Occupational History  . None  Tobacco Use  . Smoking status: Never Smoker  . Smokeless tobacco: Never Used  Substance and Sexual Activity  . Alcohol use: No    Alcohol/week: 0.6 oz    Types: 1 Shots of liquor per week    Frequency: Never    Comment: 01/30/2012 "3 drinks a month"  . Drug use: No  . Sexual activity: Yes    Birth control/protection: Injection  Other Topics Concern  . None  Social History Narrative  . None   Additional Social History:   Sleep: Good  Appetite:  Good  Current Medications: Current Facility-Administered Medications  Medication Dose Route Frequency Provider Last Rate Last Dose  .  acetaminophen (TYLENOL) tablet 650 mg  650 mg Oral Q6H PRN Rankin, Shuvon B, NP      . alum & mag hydroxide-simeth (MAALOX/MYLANTA) 200-200-20 MG/5ML suspension 30 mL  30 mL Oral Q4H PRN Rankin, Shuvon B, NP      . escitalopram (LEXAPRO) tablet 5 mg  5 mg Oral Daily Evan Osburn A, MD   5 mg at 01/28/17 0930  . LORazepam (ATIVAN) tablet 0.5 mg  0.5 mg Oral Q6H PRN Audris Speaker, Myer Peer, MD      . magnesium hydroxide (MILK OF MAGNESIA) suspension 30 mL  30 mL Oral Daily PRN Rankin, Shuvon B, NP      . traZODone (DESYREL)  tablet 50 mg  50 mg Oral QHS,MR X 1 Rozetta Nunnery, NP        Lab Results:  Results for orders placed or performed during the hospital encounter of 01/26/17 (from the past 48 hour(s))  TSH     Status: None   Collection Time: 01/28/17  6:37 AM  Result Value Ref Range   TSH 2.398 0.350 - 4.500 uIU/mL    Comment: Performed by a 3rd Generation assay with a functional sensitivity of <=0.01 uIU/mL. Performed at [facility], [place] [address], [place], [place] [address]     Blood Alcohol level:  Lab Results  Component Value Date   ETH <10 02/63/7858    Metabolic Disorder Labs: Lab Results  Component Value Date   HGBA1C 4.9 01/29/2013   MPG 108 01/29/2012   MPG 103 11/02/2011   No results found for: PROLACTIN Lab Results  Component Value Date   CHOL 190 01/30/2012   TRIG 93 01/30/2012   HDL 53 01/30/2012   CHOLHDL 3.6 01/30/2012   VLDL 19 01/30/2012   LDLCALC 118 (H) 01/30/2012    Physical Findings: AIMS: Facial and Oral Movements Muscles of Facial Expression: None, normal Lips and Perioral Area: None, normal Jaw: None, normal Tongue: None, normal,Extremity Movements Upper (arms, wrists, hands, fingers): None, normal Lower (legs, knees, ankles, toes): None, normal, Trunk Movements Neck, shoulders, hips: None, normal, Overall Severity Severity of abnormal movements (highest score from questions above): None, normal Incapacitation due to abnormal movements: None, normal Patient's awareness of abnormal movements (rate only patient's report): No Awareness, Dental Status Current problems with teeth and/or dentures?: No Does patient usually wear dentures?: No  CIWA:  CIWA-Ar Total: 1 COWS:     Musculoskeletal: Strength & Muscle Tone: within normal limits Gait & Station: normal Patient leans: N/A  Psychiatric Specialty Exam: Physical Exam  ROS denies headache, no chest pain, no dyspnea, no nausea , no vomiting , no rash, no fever   Blood pressure  117/75, pulse 96, temperature 98.7 F (37.1 C), temperature source Oral, resp. rate 18, height '5\' 6"'  (1.676 m), weight 83.9 kg (185 lb), SpO2 100 %, not currently breastfeeding.Body mass index is 29.86 kg/m.  General Appearance: Well Groomed  Eye Contact:  Good  Speech:  Normal Rate  Volume:  Normal  Mood:  improving mood, states feeling "OK"  Affect:  Appropriate and reactive  Thought Process:  Linear and Descriptions of Associations: Intact  Orientation:  Full (Time, Place, and Person)  Thought Content:  no hallucinations, no delusions, today not experiencing the intrusive thoughts/images of dropping her child   Suicidal Thoughts:  No at this time denies suicidal or self injurious ideations, and denies any homicidal ideations, specifically also denies any violent or homicidal ideations towards her child  Homicidal Thoughts:  No  Memory:  recent and remote grossly intact   Judgement:  Good  Insight:  Good  Psychomotor Activity:  Normal  Concentration:  Concentration: Good and Attention Span: Good  Recall:  Good  Fund of Knowledge:  Good  Language:  Good  Akathisia:  Negative  Handed:  Right  AIMS (if indicated):     Assets:  Communication Skills Desire for Improvement Resilience  ADL's:  Intact  Cognition:  WNL  Sleep:  Number of Hours: 6.75   Assessment - patient reports she is feeling better, and currently reports she has had no disturbing thoughts or visual images of dropping her child or of hurting herself. Denies feeling depressed, and presents euthymic. No psychotic symptoms. She is currently focused on being discharged soon. Thus far tolerating Lexapro trial well.  Treatment Plan Summary: Daily contact with patient to assess and evaluate symptoms and progress in treatment, Medication management, Plan inpatient admission and medications as below Encourage group and milieu participation to work on coping skills and symptom reduction Continue Trazodone 50 mgrs QHS PRN for  insomnia Continue Ativan 0.5 mgr Q 6 hours PRN for anxiety Continue Lexapro 5 mgrs QDAY for depression and anxiety Treatment team working on disposition planning options Jenne Campus, MD 01/28/2017, 12:02 PM

## 2017-01-28 NOTE — Plan of Care (Signed)
D patient is observed sitting in the dayroom. She is pleasant, appropriate. Articulate and able to articuallte hier has reached her " maximum potential here . RShe takes her scheduled meds as planned. And she completed her daily assessment and on this she rated her depression, hopelessness and anxeity " 0/1/1/", respectively. She is upset today aboput "missing my baby and  My family". Pt offered po reinoforcement and encouraged to continue to focus on self.R Safety in place

## 2017-01-29 DIAGNOSIS — R45851 Suicidal ideations: Secondary | ICD-10-CM

## 2017-01-29 MED ORDER — TRAZODONE HCL 50 MG PO TABS: 50.0000 mg | ORAL_TABLET | Freq: Every evening | ORAL | 0 refills | Status: DC | PRN

## 2017-01-29 MED ORDER — ESCITALOPRAM OXALATE 5 MG PO TABS: 5.0000 mg | ORAL_TABLET | Freq: Every day | ORAL | 0 refills | Status: DC

## 2017-01-29 NOTE — Progress Notes (Addendum)
Pt denied SI, given dc instrucitons and stated verbal understanding. Pt completedf daily assessment and on this she wrote she deneid SI today and she rated ehr depression, hopelessness and axneity " 0/0/1", respectively.  ALl belongings returned to pt and pt given cc of all dc paperwork ( AVS, SRA, SSP and transition record). Pt escorted to bldg entrance and dc'd.

## 2017-01-29 NOTE — Progress Notes (Signed)
  [facility] Adult Case Management Discharge Plan :  Will you be returning to the same living situation after discharge:  Yes,  will stay with mother for awhile At discharge, do you have transportation home?: Yes,  husband Do you have the ability to pay for your medications: Yes,  no barriers noted - has Medicaid  Release of information consent forms completed and turned in to Medical Records by CSW.  Patient to Follow up at: Follow-up Information    [facility] up on 02/02/2017.   Why:  Appointment for therapy at 6:45pm with [facility] information: [address] [place], [place] [address] [phone]          Next level of care provider has access to [place] and Suicide Prevention discussed: No.  declined  Have you used any form of tobacco in the last 30 days? (Cigarettes, Smokeless Tobacco, Cigars, and/or Pipes): No  Has patient been referred to the Quitline?: N/A patient is not a smoker  Patient has been referred for addiction treatment: N/A  Maretta Los, LCSW 01/29/2017, 10:13 AM

## 2017-01-29 NOTE — Discharge Summary (Signed)
Physician Discharge Summary Note  Patient:  Shelby Cunningham is an 32 y.o., female MRN:  [id number] DOB:  January 18, 1985 Patient phone:  [phone] (home)  Patient address:   [place] [address],  Total Time spent with patient: 20 minutes  Date of Admission:  01/26/2017 Date of Discharge: 01/29/17   Reason for Admission:  Worsening depression with SI   Principal Problem: MDD (major depressive disorder), severe [facility]) Discharge Diagnoses: Patient Active Problem List   Diagnosis Date Noted  . Suicidal ideations [R45.851]   . MDD (major depressive disorder), severe ([place]) [F32.2] 01/26/2017  . Brain aneurysm [I67.1] 01/13/2017  . TIA (transient ischemic attack) [G45.9] 01/29/2012  . Hemiplegia affecting right dominant side ([place]) [G81.91] 01/29/2012    Past Psychiatric History: denies prior psychiatric admissions, denies prior history of cutting or history of suicide attempts, denies history of violence, denies history of impulsivity,  denies history of psychosis, no history of severe depression or of mania in the past . Denies PTSD , denies history of OCD  Past Medical History:  Past Medical History:  Diagnosis Date  . Aneurysm ([place])    Brain - has checked every 6 months  . Exercise-induced shortness of breath    "sometimes at the gym" (01/30/2012)  . No pertinent past medical history   . Status post vacuum-assisted vaginal delivery 11/29/2016  . TIA (transient ischemic attack) 01/14    Past Surgical History:  Procedure Laterality Date  . NO PAST SURGERIES    . TEE WITHOUT CARDIOVERSION  01/31/2012   Procedure: TRANSESOPHAGEAL ECHOCARDIOGRAM (TEE);  Surgeon: Lelon Perla, MD;  Location: [facility] ENDOSCOPY;  Service: Cardiovascular;  Laterality: N/A;   Family History:  Family History  Problem Relation Age of Onset  . Diabetes Mother   . Diabetes Paternal Uncle    Family Psychiatric  History: brother has history of depression and self cutting  Social History:   Social History   Substance and Sexual Activity  Alcohol Use No  . Alcohol/week: 0.6 oz  . Types: 1 Shots of liquor per week  . Frequency: Never   Comment: 01/30/2012 "3 drinks a month"     Social History   Substance and Sexual Activity  Drug Use No    Social History   Socioeconomic History  . Marital status: Married    Spouse name: None  . Number of children: None  . Years of education: None  . Highest education level: None  Social Needs  . Financial resource strain: None  . Food insecurity - worry: None  . Food insecurity - inability: None  . Transportation needs - medical: None  . Transportation needs - non-medical: None  Occupational History  . None  Tobacco Use  . Smoking status: Never Smoker  . Smokeless tobacco: Never Used  Substance and Sexual Activity  . Alcohol use: No    Alcohol/week: 0.6 oz    Types: 1 Shots of liquor per week    Frequency: Never    Comment: 01/30/2012 "3 drinks a month"  . Drug use: No  . Sexual activity: Yes    Birth control/protection: Injection  Other Topics Concern  . None  Social History Narrative  . None    Hospital Course:   01/27/17 [facility] MD Assessment: 32 year old married female, has two month old boy ( full term, states pregnancy was uneventful, states child is healthy).  Presented to ED voluntarily reporting " visions of dropping my baby and then hurting myself" . Denies any actual  hallucinations, and describes as visual images , states " it's a thought, not a hallucination or anything like that". Identifies these thoughts as intrusive and ego dystonic and states she has no actual desire to hurt her child or herself and has a deep sense of love towards her child.  States that after giving birth to her child had mild depression, " blues", but feels that mood  has improved and she denies any major neuro-vegetative symptoms of depression ( see below)  . States she has not been feeling depressed recently . Endorses anxiety, which she  attributes to ego dystonic thoughts, images   Patient remained on the [facility] unit for 2 days and stabilized with medication and therapy. Patient was started Leaxapro 5 mg Daily and used Trazodone PRN during visit. Patient denies she will be breast feeding after discharge. Patient has shown improvement with improved mood, affect, sleep, appetite, and interaction. Patient was seen in the day room interacting with peers and staff appropriately. Patient will discharge home with her husband, who is taking time off work to help her at home and the patient's brother will staying for support as well. Patient agrees to follow up at  [facility] for therapy. Patient denies any SI/HI/AVH and contracts for safety. She is provided with prescriptions for her medications upon discharge.    Physical Findings: AIMS: Facial and Oral Movements Muscles of Facial Expression: None, normal Lips and Perioral Area: None, normal Jaw: None, normal Tongue: None, normal,Extremity Movements Upper (arms, wrists, hands, fingers): None, normal Lower (legs, knees, ankles, toes): None, normal, Trunk Movements Neck, shoulders, hips: None, normal, Overall Severity Severity of abnormal movements (highest score from questions above): None, normal Incapacitation due to abnormal movements: None, normal Patient's awareness of abnormal movements (rate only patient's report): No Awareness, Dental Status Current problems with teeth and/or dentures?: No Does patient usually wear dentures?: No  CIWA:  CIWA-Ar Total: 1 COWS:     Musculoskeletal: Strength & Muscle Tone: within normal limits Gait & Station: normal Patient leans: N/A  Psychiatric Specialty Exam: Physical Exam  Nursing note and vitals reviewed. Constitutional: She is oriented to person, place, and time. She appears well-developed and well-nourished.  Respiratory: Effort normal.  Musculoskeletal: Normal range of motion.  Neurological: She is alert and oriented to  person, place, and time.  Skin: Skin is warm.    Review of Systems  Constitutional: Negative.   HENT: Negative.   Eyes: Negative.   Respiratory: Negative.   Cardiovascular: Negative.   Gastrointestinal: Negative.   Genitourinary: Negative.   Musculoskeletal: Negative.   Skin: Negative.   Neurological: Negative.   Endo/Heme/Allergies: Negative.   Psychiatric/Behavioral: Negative.     Blood pressure 118/80, pulse (!) 104, temperature 98.3 F (36.8 C), resp. rate 18, height 5\' 6"  (1.676 m), weight 83.9 kg (185 lb), SpO2 100 %, not currently breastfeeding.Body mass index is 29.86 kg/m.  General Appearance: Casual  Eye Contact:  Good  Speech:  Clear and Coherent and Normal Rate  Volume:  Normal  Mood:  Euthymic  Affect:  Appropriate  Thought Process:  Goal Directed and Descriptions of Associations: Intact  Orientation:  Full (Time, Place, and Person)  Thought Content:  WDL  Suicidal Thoughts:  No  Homicidal Thoughts:  No  Memory:  Immediate;   Good Recent;   Good Remote;   Good  Judgement:  Fair  Insight:  Good  Psychomotor Activity:  Normal  Concentration:  Concentration: Good and Attention Span: Good  Recall:  Good  Fund of Knowledge:  Good  Language:  Good  Akathisia:  No  Handed:  Right  AIMS (if indicated):     Assets:  Communication Skills Desire for Improvement Financial Resources/Insurance Housing Physical Health Social Support Transportation  ADL's:  Intact  Cognition:  WNL  Sleep:  Number of Hours: 6.75     Have you used any form of tobacco in the last 30 days? (Cigarettes, Smokeless Tobacco, Cigars, and/or Pipes): No  Has this patient used any form of tobacco in the last 30 days? (Cigarettes, Smokeless Tobacco, Cigars, and/or Pipes) Yes, No  Blood Alcohol level:  Lab Results  Component Value Date   ETH <10 07/37/1062    Metabolic Disorder Labs:  Lab Results  Component Value Date   HGBA1C 4.9 01/29/2013   MPG 108 01/29/2012   MPG 103  11/02/2011   No results found for: PROLACTIN Lab Results  Component Value Date   CHOL 190 01/30/2012   TRIG 93 01/30/2012   HDL 53 01/30/2012   CHOLHDL 3.6 01/30/2012   VLDL 19 01/30/2012   LDLCALC 118 (H) 01/30/2012    See Psychiatric Specialty Exam and Suicide Risk Assessment completed by Attending Physician prior to discharge.  Discharge destination:  Home  Is patient on multiple antipsychotic therapies at discharge:  No   Has Patient had three or more failed trials of antipsychotic monotherapy by history:  No  Recommended Plan for Multiple Antipsychotic Therapies: NA   Allergies as of 01/29/2017   No Known Allergies     Medication List    TAKE these medications     Indication  escitalopram 5 MG tablet Commonly known as:  LEXAPRO Take 1 tablet (5 mg total) by mouth daily. For mood control  Indication:  mood stability   PRENATAL PO Take 3 tablets by mouth at bedtime.  Indication:  Per PCP   traZODone 50 MG tablet Commonly known as:  DESYREL Take 1 tablet (50 mg total) by mouth at bedtime and may repeat dose one time if needed.  Indication:  Trouble Sleeping      Follow-up Information    [facility] Follow up on 02/02/2017.   Why:  Appointment for therapy at 6:45pm with [facility] information: [address] [place], [place] [address] [phone]          Follow-up recommendations:  Continue activity as tolerated. Continue diet as recommended by your PCP. Ensure to keep all appointments with outpatient providers.  Comments:  Patient is instructed prior to discharge to: Take all medications as prescribed by his/her mental healthcare provider. Report any adverse effects and or reactions from the medicines to his/her outpatient provider promptly. Patient has been instructed & cautioned: To not engage in alcohol and or illegal drug use while on prescription medicines. In the event of worsening symptoms, patient is instructed to call  the crisis hotline, 911 and or go to the nearest ED for appropriate evaluation and treatment of symptoms. To follow-up with his/her primary care provider for your other medical issues, concerns and or health care needs.    Signed: [place], FNP 01/29/2017, 2:24 PM

## 2017-01-29 NOTE — BHH Group Notes (Signed)
[facility] LCSW Group Therapy Note  Date/Time:  01/29/2017 10:00-11:00AM  Type of Therapy and Topic:  Group Therapy:  Healthy and Unhealthy Supports  Participation Level:  Active   Description of Group:  Patients in this group were introduced to the idea of adding a variety of healthy supports to address the various needs in their lives. The picture on the front of Sunday's workbook was used to demonstrate why more supports are needed in every patient's life.  Patients identified and described healthy supports versus unhealthy supports in general, then gave examples of each in their own lives.   They discussed what additional healthy supports could be helpful in their recovery and wellness after discharge in order to prevent future hospitalizations.   An emphasis was placed on using counselor, doctor, therapy groups, 12-step groups, and problem-specific support groups to expand supports.  They also worked as a group on developing a specific plan for several patients to deal with unhealthy supports through [place], psychoeducation with loved ones, and even termination of relationships.   Therapeutic Goals:   1)  discuss importance of adding supports to stay well once out of the hospital  2)  compare healthy versus unhealthy supports and identify some examples of each  3)  generate ideas and descriptions of healthy supports that can be added  4)  offer mutual support about how to address unhealthy supports  5)  encourage active participation in and adherence to discharge plan    Summary of Patient Progress:  The patient shared that she is going to start telling people more what she is feeling inside, because there are people who are very supportive but she did not tell them how she was feeling.  She also is going to pursue her follow-up.   Therapeutic Modalities:   Motivational Interviewing Brief Solution-Focused Therapy  Selmer Dominion, LCSW

## 2017-01-29 NOTE — BHH Suicide Risk Assessment (Signed)
[facility] Discharge Suicide Risk Assessment   Principal Problem: depression Discharge Diagnoses:  Patient Active Problem List   Diagnosis Date Noted  . MDD (major depressive disorder), severe ([place]) [F32.2] 01/26/2017  . Brain aneurysm [I67.1] 01/13/2017  . TIA (transient ischemic attack) [G45.9] 01/29/2012  . Hemiplegia affecting right dominant side ([place]) [G81.91] 01/29/2012    Total Time spent with patient: 30 minutes  Musculoskeletal: Strength & Muscle Tone: within normal limits Gait & Station: normal Patient leans: N/A  Psychiatric Specialty Exam: ROS denies headache, no chest pain, no shortness of breath, no vomiting , no fever, no chills   Blood pressure 118/80, pulse (!) 104, temperature 98.3 F (36.8 C), resp. rate 18, height 5\' 6"  (1.676 m), weight 83.9 kg (185 lb), SpO2 100 %, not currently breastfeeding.Body mass index is 29.86 kg/m.  General Appearance: Well Groomed  Engineer, water::  Good  Speech:  Normal Rate409  Volume:  Normal  Mood:  presents euthymic, denies feeling depressed  Affect:  Full Range, bright  Thought Process:  Linear and Descriptions of Associations: Intact  Orientation:  Full (Time, Place, and Person)  Thought Content:  denies hallucinations, no delusions,states ruminations about dropping her child have resolved, and denies any current intrusive ideations.   Suicidal Thoughts:  No denies any suicidal or self injurious ideations, denies any homicidal or violent ideations, specifically also denies any homicidal or violent ideations towards her child, and states " I am looking forward to being with my baby "  Homicidal Thoughts:  No  Memory:  recent and remote grossly intact   Judgement:  Other:  improving   Insight:  improving   Psychomotor Activity:  Normal  Concentration:  Good  Recall:  Good  Fund of Knowledge:Good  Language: Good  Akathisia:  Negative  Handed:  Right  AIMS (if indicated):     Assets:  Communication Skills Desire for  Improvement Resilience Social Support  Sleep:  Number of Hours: 6.75  Cognition: WNL  ADL's:  Intact   Mental Status Per Nursing Assessment::   On Admission:     Demographic Factors:  32 year old female, married, lives with husband, brother, and her infant child  Loss Factors: None identified, post partum   Historical Factors: No prior psychiatric admissions, no prior history of suicidal attempts, no history of violence or impulsivity, no history of bipolar/ manic symptoms in the past, no history of psychosis  Risk Reduction Factors:   Responsible for children under 77 years of age, Sense of responsibility to family, Living with another person, especially a relative and Positive social support  Continued Clinical Symptoms:  Patient reports she is feeling much better compared to admission. At this time presents alert, attentive, well related, well groomed, mood is improved and currently euthymic, affect is improved and bright, full range, no thought disorder, no suicidal or self injurious ideations, ideations of dropping her child are fully resolved and states she is looking forward to returning home to be with her child and interact with him. Denies any violent or homicidal ideations towards her child or anyone else. No hallucinations, no delusions. Behavior on unit is calm and in good control. With patient's express consent I spoke with her husband, who has visited her on unit, he corroborates that patient seems improved and back to her " normal self ". He states he feels she is ready to discharge , and states he is going to take time off work to be with her for the next several days and  help transition back home and assist with infant's care. He also states her brother is very supportive and will be there to assist as well .  She denies medication side effects- of note, she is not breast feeding, and states she does not plan to do so after discharge  Cognitive Features That Contribute  To Risk:  No gross cognitive deficits noted upon discharge. Is alert , attentive, and oriented x 3   Suicide Risk:  Mild:  Suicidal ideation of limited frequency, intensity, duration, and specificity.  There are no identifiable plans, no associated intent, mild dysphoria and related symptoms, good self-control (both objective and subjective assessment), few other risk factors, and identifiable protective factors, including available and accessible social support.  Follow-up Information    [facility] Follow up on 02/02/2017.   Why:  Appointment for therapy at 6:45pm with [facility] information: [place],  [id number] [phone]          Plan Of Care/Follow-up recommendations:  Activity:  as tolerated  Diet:  regular Tests:  NA Other:  See below  Patient is reporting readiness for discharge and requesting discharge- there are no current grounds for involuntary commitment. She is returning home- her husband will come pick her up later today. She plans to follow up as above . She states she is fairly certain" that I will be OK " , but also states that if she has  recurrence of symptoms she will return to ED if needed .    Jenne [facility], MD 01/29/2017, 11:21 AM

## 2017-01-30 NOTE — Progress Notes (Addendum)
CSW scheduled appt for med mgmt with Jinny Blossom on this date: 1/17 4pm.  CSW called pt and left appt information on her voicemail and asked for return call confirming that she received the message. Winferd Humphrey, MSW, LCSW Clinical Social Worker 01/30/2017 9:28 AM   CSW called and left second message regarding pt medication appt above.  CSW asked for return call that pt received message. Winferd Humphrey, MSW, LCSW Clinical Social Worker 01/31/2017 9:00 AM   CSW attempted to call again, still going to voicemail.  Phone number verified from facesheet: it is the listed number. Winferd Humphrey, MSW, LCSW Clinical Social Worker 02/01/2017 3:01 PM

## 2017-02-22 ENCOUNTER — Telehealth: Payer: Self-pay | Admitting: *Deleted

## 2017-02-22 ENCOUNTER — Telehealth: Payer: Self-pay | Admitting: Obstetrics & Gynecology

## 2017-02-22 ENCOUNTER — Other Ambulatory Visit: Payer: Self-pay | Admitting: Advanced Practice Midwife

## 2017-02-22 MED ORDER — ESCITALOPRAM OXALATE 5 MG PO TABS: 5.0000 mg | ORAL_TABLET | Freq: Every day | ORAL | 0 refills | Status: DC

## 2017-02-22 NOTE — Telephone Encounter (Signed)
Refilled lexapro 5mg  X1;

## 2017-02-22 NOTE — Telephone Encounter (Signed)
Informed pt that Manus Gunning sent a refill on her Lexapro to [facility] in [place]. Pt verbalized understanding.

## 2017-02-23 ENCOUNTER — Ambulatory Visit: Payer: Medicaid Other | Admitting: Women's Health

## 2017-03-06 ENCOUNTER — Other Ambulatory Visit: Payer: Self-pay

## 2017-03-06 ENCOUNTER — Encounter: Payer: Self-pay | Admitting: Women's Health

## 2017-03-06 ENCOUNTER — Ambulatory Visit (INDEPENDENT_AMBULATORY_CARE_PROVIDER_SITE_OTHER): Payer: BLUE CROSS/BLUE SHIELD | Admitting: Women's Health

## 2017-03-06 ENCOUNTER — Encounter (INDEPENDENT_AMBULATORY_CARE_PROVIDER_SITE_OTHER): Payer: Self-pay

## 2017-03-06 VITALS — BP 136/88 | HR 93 | Ht 66.0 in | Wt 187.0 lb

## 2017-03-06 DIAGNOSIS — F53 Postpartum depression: Secondary | ICD-10-CM | POA: Diagnosis not present

## 2017-03-06 DIAGNOSIS — O99345 Other mental disorders complicating the puerperium: Principal | ICD-10-CM

## 2017-03-06 NOTE — Progress Notes (Signed)
   GYN VISIT Patient name: Shelby Cunningham MRN [id number]  Date of birth: 01-01-1986 Chief Complaint:   Follow-up (PP depression)  History of Present Illness:   Shelby Cunningham is a 32 y.o. G42P1001 Hispanic female being seen today for f/u on ppd.  Was seen on 12/28 for pp visit, scored 0 on EPDS and states today she felt great that day, received 1st dose of depo same day. On 1/8 she began having thoughts of harming herself, was seen in our office on 1/9 and sent to [facility] then [facility] for voluntary commitment. Was started on Lexapro 5mg  daily. States the 3d she was at [facility] were awful and she felt much worse coming out than she did going in. She had to do group therapy, so everything all the other mom's were talking about became her fears and obsessions. Took a good 3wks after being d/c'd to feel normal again. Sees therapist at [facility] weekly, last visit about 2wks ago d/t insurance, plans to call today to schedule next appt. States she feels so much better now. Still has rare fleeting thoughts of harming self, but nothing that she dwells on like she used to. States she would never act on thoughts. Denies HI/II. Has great family support. Her and her husband have been staying in [place] w/ her mom and sister, which has helped tremendously. Is getting ready to go back to her home in [place], she is a little worried, b/c she won't have the immediate help/support she has had. Does not plan another depo, husband plans vasectomy- has to schedule, will use condoms until he is cleared.    EPDS today=2, was 10 on 01/25/17  Patient's last menstrual period was 02/16/2017. The current method of family planning is Depo-Provera injections. Last pap 2018 @ [facility]. Results were:  normal Review of Systems:   Pertinent items are noted in HPI Denies fever/chills, dizziness, headaches, visual disturbances, fatigue, shortness of breath, chest pain, abdominal pain, vomiting, abnormal vaginal  discharge/itching/odor/irritation, problems with periods, bowel movements, urination, or intercourse unless otherwise stated above.  Pertinent History Reviewed:  Reviewed past medical,surgical, social, obstetrical and family history.  Reviewed problem list, medications and allergies. Physical Assessment:   Vitals:   03/06/17 1524  BP: 136/88  Pulse: 93  Weight: 187 lb (84.8 kg)  Height: 5\' 6"  (1.676 m)  Body mass index is 30.18 kg/m.       Physical Examination:   General appearance: alert, well appearing, and in no distress  Mental status: alert, oriented to person, place, and time  Skin: warm & dry   Cardiovascular: normal heart rate noted  Respiratory: normal respiratory effort, no distress  Abdomen: soft, non-tender   Pelvic: normal external genitalia, vulva, vagina, cervix, uterus and adnexa, examination not indicated  Extremities: no edema   No results found for this or any previous visit (from the past 24 hour(s)).  Assessment & Plan:  1) PPD w/ suicidal ideations> much improved on Lexapro 5mg , continue weekly visits w/ therapist, discussed she is to call or therapist or go straight to ED if she ever feels she will act on thoughts.   Meds: No orders of the defined types were placed in this encounter.   No orders of the defined types were placed in this encounter.   Return for cancel depo appt, f/u 3/28 for f/u.  [place], [place] [facility] 03/06/2017 4:08 PM

## 2017-04-07 ENCOUNTER — Ambulatory Visit: Payer: Medicaid Other

## 2017-04-13 ENCOUNTER — Encounter: Payer: Self-pay | Admitting: Women's Health

## 2017-04-13 ENCOUNTER — Ambulatory Visit (INDEPENDENT_AMBULATORY_CARE_PROVIDER_SITE_OTHER): Payer: BLUE CROSS/BLUE SHIELD | Admitting: Women's Health

## 2017-04-13 VITALS — BP 120/70 | HR 97 | Ht 66.0 in | Wt 191.0 lb

## 2017-04-13 DIAGNOSIS — F53 Postpartum depression: Secondary | ICD-10-CM

## 2017-04-13 DIAGNOSIS — O99345 Other mental disorders complicating the puerperium: Principal | ICD-10-CM

## 2017-04-13 NOTE — Progress Notes (Signed)
   GYN VISIT Patient name: Shelby Cunningham MRN [id number]  Date of birth: [birth year] Chief Complaint:   Follow-up (taking Lexapro)  History of Present Illness:   Shelby Cunningham is a 32 y.o. G85P1001 Hispanic female being seen today for f/u on lexapro 5mg  for postpartum depression. Doing 'great'! Has stopped therapy b/c therapist said she no longer takes her insurance. Doesn't feel she needs therapy anymore anyway. Missed 3 days of Lexapro and still felt great. Sister who is present states she is back to 'her normal self again'. Denies SI/HI/II. Rare fleeting thoughts of harming self, states she would never act on thoughts. Wants to know if it is ok to try to come off completely to see how she does. Has good support.      EPDS 10 (01/25/17), 2 (03/06/17), 1 today  Patient's last menstrual period was 03/19/2017. The current method of family planning is condoms  Last pap 2018. Results were:  normal Review of Systems:   Pertinent items are noted in HPI Denies fever/chills, dizziness, headaches, visual disturbances, fatigue, shortness of breath, chest pain, abdominal pain, vomiting, abnormal vaginal discharge/itching/odor/irritation, problems with periods, bowel movements, urination, or intercourse unless otherwise stated above.  Pertinent History Reviewed:  Reviewed past medical,surgical, social, obstetrical and family history.  Reviewed problem list, medications and allergies. Physical Assessment:   Vitals:   04/13/17 1443  BP: 120/70  Pulse: 97  Weight: 191 lb (86.6 kg)  Height: 5\' 6"  (1.676 m)  Body mass index is 30.83 kg/m.       Physical Examination:   General appearance: alert, well appearing, and in no distress  Mental status: alert, oriented to person, place, and time  Skin: warm & dry   Cardiovascular: normal heart rate noted  Respiratory: normal respiratory effort, no distress  Abdomen: soft, non-tender   Pelvic: examination not indicated  Extremities: no edema   No results found  for this or any previous visit (from the past 24 hour(s)).  Assessment & Plan:  1) Postpartum depression> doing well, wants to trial coming off of Lexapro, advised if begins to have feelings of depression, suicidal ideations again, restart immediately and let us know.   Meds: No orders of the defined types were placed in this encounter.   No orders of the defined types were placed in this encounter.   Return in about 1 month (around 05/11/2017) for F/U.  [facility], [facility] 04/13/2017 5:31 PM

## 2017-04-27 ENCOUNTER — Other Ambulatory Visit: Payer: Self-pay | Admitting: Advanced Practice Midwife

## 2017-05-15 ENCOUNTER — Ambulatory Visit: Payer: BLUE CROSS/BLUE SHIELD | Admitting: Women's Health

## 2017-05-16 ENCOUNTER — Ambulatory Visit: Payer: BLUE CROSS/BLUE SHIELD | Admitting: Women's Health

## 2017-05-16 ENCOUNTER — Other Ambulatory Visit: Payer: Self-pay

## 2017-05-16 ENCOUNTER — Encounter: Payer: Self-pay | Admitting: Women's Health

## 2017-05-16 VITALS — BP 122/66 | HR 92 | Ht 62.0 in | Wt 197.0 lb

## 2017-05-16 DIAGNOSIS — O99345 Other mental disorders complicating the puerperium: Secondary | ICD-10-CM | POA: Diagnosis not present

## 2017-05-16 DIAGNOSIS — F53 Postpartum depression: Secondary | ICD-10-CM | POA: Diagnosis not present

## 2017-05-16 MED ORDER — VITAFOL GUMMIES 3.33-0.333-34.8 MG PO CHEW: 3.0000 | CHEWABLE_TABLET | Freq: Every day | ORAL | 11 refills | Status: DC

## 2017-05-16 NOTE — Progress Notes (Signed)
   GYN VISIT Patient name: Shelby Cunningham MRN [id number]  Date of birth: 05-23-1985 Chief Complaint:   Follow-up (postpartum ; wants refill on PNV)  History of Present Illness:   Shelby Cunningham is a 32 y.o. G78P1001 Hispanic female being seen today for f/u on PPD. Baby is 27 months old. Last saw her 2 months ago and felt much better, wanted to trial coming off of Lexapro 5mg , states she did and within a week or so noted symptoms returning, so started back on meds, and is doing well again. Had fleeting thoughts of harming herself return when off meds, stopped once she got back on meds. Currently no SI/HI/II. Does not like being on medicine, so does want to come off of it at some time if possible, wants to stay on for another 2-3 months, then trial coming off again.      Patient's last menstrual period was 02/16/2017. The current method of family planning is condoms Last pap 2018. Results were:  normal Review of Systems:   Pertinent items are noted in HPI Denies fever/chills, dizziness, headaches, visual disturbances, fatigue, shortness of breath, chest pain, abdominal pain, vomiting, abnormal vaginal discharge/itching/odor/irritation, problems with periods, bowel movements, urination, or intercourse unless otherwise stated above.  Pertinent History Reviewed:  Reviewed past medical,surgical, social, obstetrical and family history.  Reviewed problem list, medications and allergies. Physical Assessment:   Vitals:   05/16/17 1509  BP: 122/66  Pulse: 92  Weight: 197 lb (89.4 kg)  Height: 5\' 2"  (1.575 m)  Body mass index is 36.03 kg/m.       Physical Examination:   General appearance: alert, well appearing, and in no distress  Mental status: alert, oriented to person, place, and time  Skin: warm & dry   Cardiovascular: normal heart rate noted  Respiratory: normal respiratory effort, no distress  Abdomen: soft, non-tender   Pelvic: examination not indicated  Extremities: no edema   No results  found for this or any previous visit (from the past 24 hour(s)).  Assessment & Plan:  1) Postpartum depression> failed trial of coming off of Lexapro 5mg . Does want to eventually come off of it if possible. To continue for another 2-10mths before next trial of trying to come off.   Meds:  Meds ordered this encounter  Medications  . Prenatal Vit-Fe Phos-FA-Omega (VITAFOL GUMMIES) 3.33-0.333-34.8 MG CHEW    Sig: Chew 3 tablets by mouth daily.    Dispense:  90 tablet    Refill:  11    Order Specific Question:   Supervising Provider    Answer:   Tania Ade H [[id number]]    No orders of the defined types were placed in this encounter.   Return in about 3 months (around 08/15/2017) for F/U.  [place], [facility] 05/16/17

## 2017-05-17 ENCOUNTER — Telehealth: Payer: Self-pay | Admitting: *Deleted

## 2017-05-17 NOTE — Telephone Encounter (Signed)
LMOVM that vitamin not covered by insurance and PA was required.  Advised to call if she wants me to proceed or have it changed.

## 2017-05-19 ENCOUNTER — Encounter: Payer: Self-pay | Admitting: Women's Health

## 2017-05-23 ENCOUNTER — Encounter: Payer: Self-pay | Admitting: Women's Health

## 2017-05-25 ENCOUNTER — Encounter: Payer: Self-pay | Admitting: Women's Health

## 2017-05-25 ENCOUNTER — Other Ambulatory Visit: Payer: Self-pay | Admitting: Women's Health

## 2017-05-25 MED ORDER — OB COMPLETE PETITE 35-5-1-200 MG PO CAPS: ORAL_CAPSULE | ORAL | 11 refills | Status: DC

## 2017-05-31 ENCOUNTER — Encounter: Payer: Self-pay | Admitting: Women's Health

## 2017-06-01 ENCOUNTER — Other Ambulatory Visit: Payer: Self-pay | Admitting: Women's Health

## 2017-06-01 MED ORDER — VITAFOL GUMMIES 3.33-0.333-34.8 MG PO CHEW: 1.0000 | CHEWABLE_TABLET | Freq: Every day | ORAL | 3 refills | Status: DC

## 2017-06-05 ENCOUNTER — Encounter: Payer: Self-pay | Admitting: Women's Health

## 2017-08-14 ENCOUNTER — Ambulatory Visit: Payer: BLUE CROSS/BLUE SHIELD | Admitting: Women's Health

## 2017-08-14 ENCOUNTER — Other Ambulatory Visit: Payer: Self-pay

## 2017-08-14 ENCOUNTER — Encounter: Payer: Self-pay | Admitting: Women's Health

## 2017-08-14 DIAGNOSIS — F53 Postpartum depression: Secondary | ICD-10-CM

## 2017-08-14 DIAGNOSIS — O99345 Other mental disorders complicating the puerperium: Principal | ICD-10-CM

## 2017-08-14 NOTE — Progress Notes (Signed)
   GYN VISIT Patient name: Shelby Cunningham MRN [id number]  Date of birth: 1985-07-19 Chief Complaint:   Follow-up (postpartum dep)  History of Present Illness:   Shelby Cunningham is a 32 y.o. G22P1001 Hispanic female being seen today for f/u on PPD. Baby is 64 months old. Taking Lexapro 5mg  daily and feels much better. Wants to trial coming off and see how she does. Denies SI/HI/II. Feels great!   EPDS today 0.   Patient's last menstrual period was 08/08/2017. The current method of family planning is condoms. Last pap 2018. Results were:  normal Review of Systems:   Pertinent items are noted in HPI Denies fever/chills, dizziness, headaches, visual disturbances, fatigue, shortness of breath, chest pain, abdominal pain, vomiting, abnormal vaginal discharge/itching/odor/irritation, problems with periods, bowel movements, urination, or intercourse unless otherwise stated above.  Pertinent History Reviewed:  Reviewed past medical,surgical, social, obstetrical and family history.  Reviewed problem list, medications and allergies. Physical Assessment:   Vitals:   08/14/17 1614  BP: 128/79  Pulse: 87  Weight: 197 lb (89.4 kg)  Height: 5\' 5"  (1.651 m)  Body mass index is 32.78 kg/m.       Physical Examination:   General appearance: alert, well appearing, and in no distress  Mental status: alert, oriented to person, place, and time  Skin: warm & dry   Cardiovascular: normal heart rate noted  Respiratory: normal respiratory effort, no distress  Abdomen: soft, non-tender   Pelvic: examination not indicated  Extremities: no edema   No results found for this or any previous visit (from the past 24 hour(s)).  Assessment & Plan:  1) PPD> currently on lexapro 5mg  daily, wants to trial coming off and see how she does. Does not want f/u at this time. Will call if she needs to  Meds: No orders of the defined types were placed in this encounter.   No orders of the defined types were placed in this  encounter.   Return in about 6 months (around 02/14/2018) for Physical.  Roma Schanz CNM, [facility] 08/14/2017 4:29 PM

## 2018-02-27 ENCOUNTER — Ambulatory Visit: Payer: BLUE CROSS/BLUE SHIELD | Admitting: Women's Health

## 2018-03-07 ENCOUNTER — Encounter: Payer: Self-pay | Admitting: Women's Health

## 2018-03-07 ENCOUNTER — Ambulatory Visit: Payer: BLUE CROSS/BLUE SHIELD | Admitting: Women's Health

## 2018-03-07 VITALS — BP 130/82 | HR 84 | Ht 66.0 in | Wt 190.0 lb

## 2018-03-07 DIAGNOSIS — Z3202 Encounter for pregnancy test, result negative: Secondary | ICD-10-CM | POA: Diagnosis not present

## 2018-03-07 DIAGNOSIS — N644 Mastodynia: Secondary | ICD-10-CM

## 2018-03-07 LAB — POCT URINE PREGNANCY: Preg Test, Ur: NEGATIVE

## 2018-03-07 NOTE — Progress Notes (Signed)
   GYN VISIT Patient name: Shelby Cunningham MRN [id number]  Date of birth: 1986/01/15 Chief Complaint:   pain in both breasts  History of Present Illness:   Shelby Cunningham is a 33 y.o. G54P1001 Hispanic female being seen today for report of bilateral breast pain x 3wks, L>R, was constant at first, now intermittent, and slowly going away. Denies lumps/bumps, nipple discharge, family h/o breast CA. Only drinks caffeine maybe 1-2x/wk. No new medicines.      Patient's last menstrual period was 03/02/2018. The current method of family planning is condoms, husband planning vasectomy Last pap 2018 at [facility]. Results were:  normal Review of Systems:   Pertinent items are noted in HPI Denies fever/chills, dizziness, headaches, visual disturbances, fatigue, shortness of breath, chest pain, abdominal pain, vomiting, abnormal vaginal discharge/itching/odor/irritation, problems with periods, bowel movements, urination, or intercourse unless otherwise stated above.  Pertinent History Reviewed:  Reviewed past medical,surgical, social, obstetrical and family history.  Reviewed problem list, medications and allergies. Physical Assessment:   Vitals:   03/07/18 1411  BP: 130/82  Pulse: 84  Weight: 190 lb (86.2 kg)  Height: 5\' 6"  (1.676 m)  Body mass index is 30.67 kg/m.       Physical Examination:   General appearance: alert, well appearing, and in no distress  Mental status: alert, oriented to person, place, and time  Skin: warm & dry   Cardiovascular: normal heart rate noted  Respiratory: normal respiratory effort, no distress  Breasts - breasts appear normal, no suspicious masses, no skin or nipple changes or axillary nodes; mild bilateral tenderness upper/inner quadrants  Abdomen: soft, non-tender   Pelvic: examination not indicated  Extremities: no edema   Results for orders placed or performed in visit on 03/07/18 (from the past 24 hour(s))  POCT urine pregnancy   Collection Time: 03/07/18   2:32 PM  Result Value Ref Range   Preg Test, Ur Negative Negative    Assessment & Plan:  1) Bilateral mastalgia> no masses/discharge, per report is improving/going away, if returns/new sx let us know  Meds: No orders of the defined types were placed in this encounter.   Orders Placed This Encounter  Procedures  . POCT urine pregnancy    Return in about 1 year (around 03/08/2019) for Pap & physical.  Roma Schanz CNM, [facility] 03/07/2018 2:46 PM

## 2018-04-02 ENCOUNTER — Other Ambulatory Visit: Payer: Self-pay | Admitting: Family Medicine

## 2018-04-02 ENCOUNTER — Ambulatory Visit
Admission: RE | Admit: 2018-04-02 | Discharge: 2018-04-02 | Disposition: A | Discharge status: Home / Self Care | Payer: Medicaid Other | Source: Ambulatory Visit | Attending: Family Medicine | Admitting: Family Medicine

## 2018-04-02 DIAGNOSIS — M25512 Pain in left shoulder: Secondary | ICD-10-CM

## 2021-01-13 ENCOUNTER — Encounter (HOSPITAL_COMMUNITY): Payer: Self-pay

## 2021-01-13 ENCOUNTER — Emergency Department (HOSPITAL_COMMUNITY)
Admission: EM | Admit: 2021-01-13 | Discharge: 2021-01-14 | Disposition: A | Discharge status: Home / Self Care | Payer: 59 | Attending: Emergency Medicine | Admitting: Emergency Medicine

## 2021-01-13 ENCOUNTER — Other Ambulatory Visit: Payer: Self-pay

## 2021-01-13 DIAGNOSIS — K2960 Other gastritis without bleeding: Secondary | ICD-10-CM | POA: Insufficient documentation

## 2021-01-13 DIAGNOSIS — N9489 Other specified conditions associated with female genital organs and menstrual cycle: Secondary | ICD-10-CM | POA: Insufficient documentation

## 2021-01-13 DIAGNOSIS — K2900 Acute gastritis without bleeding: Secondary | ICD-10-CM

## 2021-01-13 DIAGNOSIS — R1013 Epigastric pain: Secondary | ICD-10-CM | POA: Diagnosis present

## 2021-01-13 LAB — URINALYSIS, ROUTINE W REFLEX MICROSCOPIC
Bilirubin Urine: NEGATIVE
Glucose, UA: NEGATIVE mg/dL
Ketones, ur: 5 mg/dL — AB
Leukocytes,Ua: NEGATIVE
Nitrite: NEGATIVE
Protein, ur: 30 mg/dL — AB
RBC / HPF: >50 RBC/hpf — ABNORMAL HIGH (ref 0–5)
Specific Gravity, Urine: 1.031 — ABNORMAL HIGH (ref 1.005–1.030)
pH: 6 (ref 5.0–8.0)

## 2021-01-13 LAB — COMPREHENSIVE METABOLIC PANEL
ALT: 30 U/L (ref 0–44)
AST: 24 U/L (ref 15–41)
Albumin: 4.4 g/dL (ref 3.5–5.0)
Alkaline Phosphatase: 117 U/L (ref 38–126)
Anion gap: 8 (ref 5–15)
BUN: 11 mg/dL (ref 6–20)
CO2: 25 mmol/L (ref 22–32)
Calcium: 9.3 mg/dL (ref 8.9–10.3)
Chloride: 104 mmol/L (ref 98–111)
Creatinine, Ser: 0.67 mg/dL (ref 0.44–1.00)
GFR, Estimated: >60 mL/min (ref >60)
Glucose, Bld: 106 mg/dL — ABNORMAL HIGH (ref 70–99)
Potassium: 3.5 mmol/L (ref 3.5–5.1)
Sodium: 137 mmol/L (ref 135–145)
Total Bilirubin: 0.6 mg/dL (ref 0.3–1.2)
Total Protein: 8.4 g/dL — ABNORMAL HIGH (ref 6.5–8.1)

## 2021-01-13 LAB — CBC
HCT: 44.8 % (ref 36.0–46.0)
Hemoglobin: 14.8 g/dL (ref 12.0–15.0)
MCH: 27.2 pg (ref 26.0–34.0)
MCHC: 33 g/dL (ref 30.0–36.0)
MCV: 82.4 fL (ref 80.0–100.0)
Platelets: 330 10*3/uL (ref 150–400)
RBC: 5.44 MIL/uL — ABNORMAL HIGH (ref 3.87–5.11)
RDW: 12.5 % (ref 11.5–15.5)
WBC: 13.2 10*3/uL — ABNORMAL HIGH (ref 4.0–10.5)
nRBC: 0 % (ref 0.0–0.2)

## 2021-01-13 LAB — I-STAT BETA HCG BLOOD, ED (MC, WL, AP ONLY): I-stat hCG, quantitative: <5 m[IU]/mL (ref <5)

## 2021-01-13 LAB — LIPASE, BLOOD: Lipase: 26 U/L (ref 11–51)

## 2021-01-13 MED ORDER — ONDANSETRON 4 MG PO TBDP
4.0000 mg | ORAL_TABLET | Freq: Once | ORAL | Status: AC
  Administered 2021-01-13: 18:00:00 4 mg via ORAL
  Filled 2021-01-13: qty 1

## 2021-01-13 MED ORDER — FAMOTIDINE IN NACL 20-0.9 MG/50ML-% IV SOLN
20.0000 mg | Freq: Once | INTRAVENOUS | Status: AC
  Administered 2021-01-13: 23:00:00 20 mg via INTRAVENOUS
  Filled 2021-01-13: qty 50

## 2021-01-13 MED ORDER — ONDANSETRON HCL 4 MG/2ML IJ SOLN
4.0000 mg | Freq: Once | INTRAMUSCULAR | Status: AC
  Administered 2021-01-13: 23:00:00 4 mg via INTRAVENOUS
  Filled 2021-01-13: qty 2

## 2021-01-13 MED ORDER — SODIUM CHLORIDE 0.9 % IV BOLUS
1000.0000 mL | Freq: Once | INTRAVENOUS | Status: AC
  Administered 2021-01-13: 23:00:00 1000 mL via INTRAVENOUS

## 2021-01-13 NOTE — ED Provider Notes (Signed)
Emergency Medicine Provider Triage Evaluation Note  Shelby Cunningham , a 35 y.o. female  was evaluated in triage.  Pt complains of burning upper abdominal pain onset 3 days. Denies sick contact. Has associated nausea, vomiting, chills. Tried TUMS, pepcid, pepto bismol with mild relief. Denies chest pain, shortness of breath, fever, dysuria, hematuria. Actively on menstrual cycle. Has had acid reflux in the past, but no diagnosis of GERD.    Review of Systems  Positive: Abdominal pain, nausea, vomiting Negative: Shortness of breath, fever  Physical Exam  BP (!) 133/95    Pulse 96    Temp 99.1 F (37.3 C) (Oral)    Resp 16    SpO2 100%  Gen:   Awake, no distress  Resp:  Normal effort  MSK:   Moves extremities without difficulty  Other:  Mild diffuse abdominal tenderness to palpation  Medical Decision Making  Medically screening exam initiated at 5:54 PM.  Appropriate orders placed.  Shelby Cunningham was informed that the remainder of the evaluation will be completed by another provider, this initial triage assessment does not replace that evaluation, and the importance of remaining in the ED until their evaluation is complete.    Shelby Settle, PA-C 01/13/21 1801    Shelby Fuse, MD 01/14/21 [phone]

## 2021-01-13 NOTE — ED Provider Notes (Signed)
[facility] DEPT Provider Note   CSN: [id number] Arrival date & time: 01/13/21  1654     History Chief Complaint  Patient presents with   Abdominal Pain   Nausea    Shelby Cunningham is a 35 y.o. female.  Patient reporting nausea, vomiting, burning upper abdominal pain onset three days ago after eating a [place] dish. Occasional chills. Vomiting frequency has diminished, with only two episodes today. Vomitus is bilious currently.  The history is provided by the patient. No language interpreter was used.  Abdominal Pain Pain location:  Epigastric Pain quality: burning   Pain severity:  Moderate Onset quality:  Gradual Duration:  3 days Timing:  Intermittent Progression:  Waxing and waning Chronicity:  New Context: suspicious food intake   Ineffective treatments:  OTC medications Associated symptoms: chills, nausea and vomiting   Associated symptoms: no diarrhea, no dysuria, no hematemesis and no melena       Past Medical History:  Diagnosis Date   Aneurysm ([place])    Brain - has checked every 6 months   Exercise-induced shortness of breath    "sometimes at the gym" (01/30/2012)   No pertinent past medical history    Status post vacuum-assisted vaginal delivery 11/29/2016   TIA (transient ischemic attack) 01/14    Patient Active Problem List   Diagnosis Date Noted   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   Suicidal ideations    MDD (major depressive disorder), severe ([place]) 01/26/2017   Brain aneurysm 01/13/2017   TIA (transient ischemic attack) 01/29/2012   Hemiplegia affecting right dominant side ([place]) 01/29/2012    Past Surgical History:  Procedure Laterality Date   NO PAST SURGERIES     TEE WITHOUT CARDIOVERSION  01/31/2012   Procedure: TRANSESOPHAGEAL ECHOCARDIOGRAM (TEE);  Surgeon: Lelon Perla, MD;  Location: [facility];  Service: Cardiovascular;  Laterality: N/A;     OB History     Gravida  1   Para  1   Term  1    Preterm      AB      Living  1      SAB      IAB      Ectopic      Multiple  0   Live Births  1           Family History  Problem Relation Age of Onset   Diabetes Mother    Diabetes Paternal Uncle     Social History   Tobacco Use   Smoking status: Never   Smokeless tobacco: Never  Vaping Use   Vaping Use: Never used  Substance Use Topics   Alcohol use: No    Alcohol/week: 1.0 standard drink    Types: 1 Shots of liquor per week    Comment: 01/30/2012 "3 drinks a month"   Drug use: No    Home Medications Prior to Admission medications   Not on File    Allergies    Patient has no known allergies.  Review of Systems   Review of Systems  Constitutional:  Positive for chills.  Gastrointestinal:  Positive for abdominal pain, nausea and vomiting. Negative for diarrhea, hematemesis and melena.  Genitourinary:  Negative for dysuria.  All other systems reviewed and are negative.  Physical Exam Updated Vital Signs BP 137/87    Pulse 79    Temp 99.1 F (37.3 C) (Oral)    Resp 17    SpO2 100%   Physical Exam Constitutional:  Appearance: She is well-developed. She is not ill-appearing.  HENT:     Head: Normocephalic.     Nose: Nose normal.     Mouth/Throat:     Mouth: Mucous membranes are moist.  Eyes:     Conjunctiva/sclera: Conjunctivae normal.  Cardiovascular:     Rate and Rhythm: Normal rate.  Pulmonary:     Effort: Pulmonary effort is normal.  Abdominal:     General: Abdomen is flat.     Palpations: Abdomen is soft.     Tenderness: There is abdominal tenderness in the epigastric area.  Skin:    General: Skin is warm and dry.  Neurological:     Mental Status: She is alert and oriented to person, place, and time.  Psychiatric:        Mood and Affect: Mood normal.        Behavior: Behavior normal.    ED Results / Procedures / Treatments   Labs (all labs ordered are listed, but only abnormal results are displayed) Labs Reviewed   COMPREHENSIVE METABOLIC PANEL - Abnormal; Notable for the following components:      Result Value   Glucose, Bld 106 (*)    Total Protein 8.4 (*)    All other components within normal limits  CBC - Abnormal; Notable for the following components:   WBC 13.2 (*)    RBC 5.44 (*)    All other components within normal limits  URINALYSIS, ROUTINE W REFLEX MICROSCOPIC - Abnormal; Notable for the following components:   APPearance TURBID (*)    Specific Gravity, Urine 1.031 (*)    Hgb urine dipstick LARGE (*)    Ketones, ur 5 (*)    Protein, ur 30 (*)    RBC / HPF >50 (*)    Bacteria, UA MANY (*)    All other components within normal limits  LIPASE, BLOOD  I-STAT BETA HCG BLOOD, ED ([facility], [facility], [facility] ONLY)    EKG None  Radiology No results found.  Procedures Procedures   Medications Ordered in ED Medications  sodium chloride 0.9 % bolus 1,000 mL (has no administration in time range)  ondansetron (ZOFRAN) injection 4 mg (has no administration in time range)  famotidine (PEPCID) IVPB 20 mg premix (has no administration in time range)  ondansetron (ZOFRAN-ODT) disintegrating tablet 4 mg (4 mg Oral Given 01/13/21 1815)    ED Course  I have reviewed the triage vital signs and the nursing notes.  Pertinent labs & imaging results that were available during my care of the patient were reviewed by me and considered in my medical decision making (see chart for details).    MDM Rules/Calculators/A&P                         Patient is nontoxic, nonseptic appearing, in no apparent distress.  Patient's pain and other symptoms adequately managed in emergency department.  Fluid bolus given.  Labs and vitals reviewed.  Patient does not meet the SIRS or Sepsis criteria.  On repeat exam patient does not have a surgical abdomen and there are no peritoneal signs.   Patient discharged home with symptomatic treatment and given strict instructions for follow-up with their primary care physician.  I have  also discussed reasons to return immediately to the ER.  Patient expresses understanding and agrees with plan.      Final Clinical Impression(s) / ED Diagnoses Final diagnoses:  Other acute gastritis without hemorrhage    Rx /  DC Orders ED Discharge Orders          Ordered    famotidine (PEPCID) 20 MG tablet  2 times daily        01/14/21 0010    ondansetron (ZOFRAN-ODT) 4 MG disintegrating tablet        01/14/21 0010             Etta Quill, NP 01/14/21 0013    Luna Fuse, MD 01/14/21 [phone]

## 2021-01-14 ENCOUNTER — Observation Stay (HOSPITAL_COMMUNITY)
Admission: EM | Admit: 2021-01-14 | Discharge: 2021-01-15 | Disposition: A | Discharge status: Home / Self Care | Payer: 59 | Attending: General Surgery | Admitting: General Surgery

## 2021-01-14 ENCOUNTER — Emergency Department (HOSPITAL_COMMUNITY): Payer: 59

## 2021-01-14 ENCOUNTER — Encounter (HOSPITAL_COMMUNITY)
Admission: EM | Disposition: A | Discharge status: Home / Self Care | Payer: Self-pay | Source: Home / Self Care | Attending: Emergency Medicine

## 2021-01-14 ENCOUNTER — Emergency Department (HOSPITAL_COMMUNITY): Payer: 59 | Admitting: Certified Registered Nurse Anesthetist

## 2021-01-14 ENCOUNTER — Other Ambulatory Visit: Payer: Self-pay

## 2021-01-14 ENCOUNTER — Encounter (HOSPITAL_COMMUNITY): Payer: Self-pay

## 2021-01-14 DIAGNOSIS — K812 Acute cholecystitis with chronic cholecystitis: Secondary | ICD-10-CM | POA: Diagnosis not present

## 2021-01-14 DIAGNOSIS — K810 Acute cholecystitis: Secondary | ICD-10-CM

## 2021-01-14 DIAGNOSIS — R1011 Right upper quadrant pain: Secondary | ICD-10-CM | POA: Diagnosis present

## 2021-01-14 DIAGNOSIS — Z79899 Other long term (current) drug therapy: Secondary | ICD-10-CM | POA: Diagnosis not present

## 2021-01-14 DIAGNOSIS — Z20822 Contact with and (suspected) exposure to covid-19: Secondary | ICD-10-CM | POA: Diagnosis not present

## 2021-01-14 DIAGNOSIS — Z8673 Personal history of transient ischemic attack (TIA), and cerebral infarction without residual deficits: Secondary | ICD-10-CM | POA: Diagnosis not present

## 2021-01-14 DIAGNOSIS — K819 Cholecystitis, unspecified: Secondary | ICD-10-CM

## 2021-01-14 DIAGNOSIS — C23 Malignant neoplasm of gallbladder: Principal | ICD-10-CM | POA: Insufficient documentation

## 2021-01-14 HISTORY — PX: LAPAROSCOPIC CHOLECYSTECTOMY SINGLE SITE WITH INTRAOPERATIVE CHOLANGIOGRAM: SHX6538

## 2021-01-14 HISTORY — DX: Acute cholecystitis: K81.0

## 2021-01-14 LAB — CBC WITH DIFFERENTIAL/PLATELET
Abs Immature Granulocytes: 0.04 10*3/uL (ref 0.00–0.07)
Basophils Absolute: 0.1 10*3/uL (ref 0.0–0.1)
Basophils Relative: 1 %
Eosinophils Absolute: 0.1 10*3/uL (ref 0.0–0.5)
Eosinophils Relative: 0 %
HCT: 44.2 % (ref 36.0–46.0)
Hemoglobin: 14.5 g/dL (ref 12.0–15.0)
Immature Granulocytes: 0 %
Lymphocytes Relative: 13 %
Lymphs Abs: 1.7 10*3/uL (ref 0.7–4.0)
MCH: 27.3 pg (ref 26.0–34.0)
MCHC: 32.8 g/dL (ref 30.0–36.0)
MCV: 83.2 fL (ref 80.0–100.0)
Monocytes Absolute: 0.5 10*3/uL (ref 0.1–1.0)
Monocytes Relative: 4 %
Neutro Abs: 11.2 10*3/uL — ABNORMAL HIGH (ref 1.7–7.7)
Neutrophils Relative %: 82 %
Platelets: 310 10*3/uL (ref 150–400)
RBC: 5.31 MIL/uL — ABNORMAL HIGH (ref 3.87–5.11)
RDW: 12.6 % (ref 11.5–15.5)
WBC: 13.7 10*3/uL — ABNORMAL HIGH (ref 4.0–10.5)
nRBC: 0 % (ref 0.0–0.2)

## 2021-01-14 LAB — COMPREHENSIVE METABOLIC PANEL
ALT: 26 U/L (ref 0–44)
AST: 21 U/L (ref 15–41)
Albumin: 4.3 g/dL (ref 3.5–5.0)
Alkaline Phosphatase: 108 U/L (ref 38–126)
Anion gap: 9 (ref 5–15)
BUN: 10 mg/dL (ref 6–20)
CO2: 25 mmol/L (ref 22–32)
Calcium: 9.2 mg/dL (ref 8.9–10.3)
Chloride: 105 mmol/L (ref 98–111)
Creatinine, Ser: 0.76 mg/dL (ref 0.44–1.00)
GFR, Estimated: >60 mL/min (ref >60)
Glucose, Bld: 107 mg/dL — ABNORMAL HIGH (ref 70–99)
Potassium: 4.2 mmol/L (ref 3.5–5.1)
Sodium: 139 mmol/L (ref 135–145)
Total Bilirubin: 0.6 mg/dL (ref 0.3–1.2)
Total Protein: 8 g/dL (ref 6.5–8.1)

## 2021-01-14 LAB — RESP PANEL BY RT-PCR (FLU A&B, COVID) ARPGX2
Influenza A by PCR: NEGATIVE
Influenza B by PCR: NEGATIVE
SARS Coronavirus 2 by RT PCR: NEGATIVE

## 2021-01-14 LAB — LIPASE, BLOOD: Lipase: 30 U/L (ref 11–51)

## 2021-01-14 SURGERY — LAPAROSCOPIC CHOLECYSTECTOMY SINGLE SITE WITH INTRAOPERATIVE CHOLANGIOGRAM
Anesthesia: General | Site: Abdomen

## 2021-01-14 MED ORDER — CELECOXIB 200 MG PO CAPS: 200.0000 mg | ORAL_CAPSULE | ORAL | Status: DC

## 2021-01-14 MED ORDER — PHENYLEPHRINE 40 MCG/ML (10ML) SYRINGE FOR IV PUSH (FOR BLOOD PRESSURE SUPPORT)
PREFILLED_SYRINGE | INTRAVENOUS | Status: DC | PRN
  Administered 2021-01-14 (×2): 120 ug via INTRAVENOUS

## 2021-01-14 MED ORDER — SODIUM CHLORIDE 0.9 % IV SOLN
2.0000 g | INTRAVENOUS | Status: AC
  Administered 2021-01-14: 13:00:00 2 g via INTRAVENOUS

## 2021-01-14 MED ORDER — ACETAMINOPHEN 500 MG PO TABS
1000.0000 mg | ORAL_TABLET | Freq: Four times a day (QID) | ORAL | Status: DC
  Administered 2021-01-14 – 2021-01-15 (×3): 1000 mg via ORAL
  Filled 2021-01-14 (×3): qty 2

## 2021-01-14 MED ORDER — ROCURONIUM BROMIDE 10 MG/ML (PF) SYRINGE
PREFILLED_SYRINGE | INTRAVENOUS | Status: DC | PRN
  Administered 2021-01-14: 60 mg via INTRAVENOUS

## 2021-01-14 MED ORDER — MIDAZOLAM HCL 5 MG/5ML IJ SOLN
INTRAMUSCULAR | Status: DC | PRN
  Administered 2021-01-14: 2 mg via INTRAVENOUS

## 2021-01-14 MED ORDER — ALUM & MAG HYDROXIDE-SIMETH 200-200-20 MG/5ML PO SUSP: 30.0000 mL | Freq: Four times a day (QID) | ORAL | Status: DC | PRN

## 2021-01-14 MED ORDER — BUPIVACAINE LIPOSOME 1.3 % IJ SUSP: 20.0000 mL | Freq: Once | INTRAMUSCULAR | Status: DC

## 2021-01-14 MED ORDER — DOCUSATE SODIUM 100 MG PO CAPS
100.0000 mg | ORAL_CAPSULE | Freq: Two times a day (BID) | ORAL | Status: DC
  Administered 2021-01-14: 22:00:00 100 mg via ORAL
  Filled 2021-01-14: qty 1

## 2021-01-14 MED ORDER — ALUM & MAG HYDROXIDE-SIMETH 200-200-20 MG/5ML PO SUSP
30.0000 mL | Freq: Once | ORAL | Status: AC
  Administered 2021-01-14: 09:00:00 30 mL via ORAL
  Filled 2021-01-14: qty 30

## 2021-01-14 MED ORDER — SODIUM CHLORIDE 0.9 % IV SOLN
INTRAVENOUS | Status: AC
  Filled 2021-01-14: qty 20

## 2021-01-14 MED ORDER — AMISULPRIDE (ANTIEMETIC) 5 MG/2ML IV SOLN: 10.0000 mg | Freq: Once | INTRAVENOUS | Status: DC | PRN

## 2021-01-14 MED ORDER — FENTANYL CITRATE (PF) 250 MCG/5ML IJ SOLN
INTRAMUSCULAR | Status: AC
  Filled 2021-01-14: qty 5

## 2021-01-14 MED ORDER — FENTANYL CITRATE PF 50 MCG/ML IJ SOSY
25.0000 ug | PREFILLED_SYRINGE | INTRAMUSCULAR | Status: DC | PRN
  Administered 2021-01-14: 16:00:00 50 ug via INTRAVENOUS

## 2021-01-14 MED ORDER — ACETAMINOPHEN 500 MG PO TABS: 1000.0000 mg | ORAL_TABLET | ORAL | Status: DC

## 2021-01-14 MED ORDER — BUPIVACAINE-EPINEPHRINE (PF) 0.25% -1:200000 IJ SOLN
INTRAMUSCULAR | Status: AC
  Filled 2021-01-14: qty 30

## 2021-01-14 MED ORDER — ENOXAPARIN SODIUM 40 MG/0.4ML IJ SOSY
40.0000 mg | PREFILLED_SYRINGE | INTRAMUSCULAR | Status: DC
  Filled 2021-01-14: qty 0.4

## 2021-01-14 MED ORDER — LIDOCAINE 2% (20 MG/ML) 5 ML SYRINGE
INTRAMUSCULAR | Status: DC | PRN
  Administered 2021-01-14: 60 mg via INTRAVENOUS

## 2021-01-14 MED ORDER — STERILE WATER FOR IRRIGATION IR SOLN
Status: DC | PRN
  Administered 2021-01-14: 1000 mL

## 2021-01-14 MED ORDER — CHLORHEXIDINE GLUCONATE CLOTH 2 % EX PADS: 6.0000 | MEDICATED_PAD | Freq: Once | CUTANEOUS | Status: DC

## 2021-01-14 MED ORDER — ACETAMINOPHEN 10 MG/ML IV SOLN: 1000.0000 mg | Freq: Once | INTRAVENOUS | Status: DC | PRN

## 2021-01-14 MED ORDER — PROMETHAZINE HCL 25 MG/ML IJ SOLN: 6.2500 mg | INTRAMUSCULAR | Status: DC | PRN

## 2021-01-14 MED ORDER — METHOCARBAMOL 1000 MG/10ML IJ SOLN
1000.0000 mg | Freq: Four times a day (QID) | INTRAVENOUS | Status: DC | PRN
  Filled 2021-01-14: qty 10

## 2021-01-14 MED ORDER — ACETAMINOPHEN 160 MG/5ML PO SOLN: 1000.0000 mg | Freq: Once | ORAL | Status: DC | PRN

## 2021-01-14 MED ORDER — LACTATED RINGERS IV SOLN: INTRAVENOUS | Status: DC

## 2021-01-14 MED ORDER — MENTHOL 3 MG MT LOZG: 1.0000 | LOZENGE | OROMUCOSAL | Status: DC | PRN

## 2021-01-14 MED ORDER — PHENYLEPHRINE 40 MCG/ML (10ML) SYRINGE FOR IV PUSH (FOR BLOOD PRESSURE SUPPORT)
PREFILLED_SYRINGE | INTRAVENOUS | Status: AC
  Filled 2021-01-14: qty 10

## 2021-01-14 MED ORDER — SUCCINYLCHOLINE CHLORIDE 200 MG/10ML IV SOSY
PREFILLED_SYRINGE | INTRAVENOUS | Status: DC | PRN
  Administered 2021-01-14: 120 mg via INTRAVENOUS

## 2021-01-14 MED ORDER — HYDROMORPHONE HCL 1 MG/ML IJ SOLN
1.0000 mg | Freq: Once | INTRAMUSCULAR | Status: AC
  Administered 2021-01-14: 11:00:00 1 mg via INTRAVENOUS
  Filled 2021-01-14: qty 1

## 2021-01-14 MED ORDER — LIDOCAINE VISCOUS HCL 2 % MT SOLN
15.0000 mL | Freq: Once | OROMUCOSAL | Status: AC
  Administered 2021-01-14: 09:00:00 15 mL via ORAL
  Filled 2021-01-14: qty 15

## 2021-01-14 MED ORDER — GABAPENTIN 300 MG PO CAPS: 300.0000 mg | ORAL_CAPSULE | ORAL | Status: DC

## 2021-01-14 MED ORDER — FAMOTIDINE 20 MG PO TABS: 20.0000 mg | ORAL_TABLET | Freq: Two times a day (BID) | ORAL | 0 refills | Status: DC

## 2021-01-14 MED ORDER — DIPHENHYDRAMINE HCL 50 MG/ML IJ SOLN: 12.5000 mg | Freq: Four times a day (QID) | INTRAMUSCULAR | Status: DC | PRN

## 2021-01-14 MED ORDER — PIPERACILLIN-TAZOBACTAM 3.375 G IVPB: 3.3750 g | Freq: Three times a day (TID) | INTRAVENOUS | Status: DC

## 2021-01-14 MED ORDER — FENTANYL CITRATE PF 50 MCG/ML IJ SOSY
PREFILLED_SYRINGE | INTRAMUSCULAR | Status: AC
  Filled 2021-01-14: qty 1

## 2021-01-14 MED ORDER — OXYCODONE HCL 5 MG/5ML PO SOLN: 5.0000 mg | Freq: Once | ORAL | Status: AC | PRN

## 2021-01-14 MED ORDER — OXYCODONE HCL 5 MG PO TABS
ORAL_TABLET | ORAL | Status: AC
  Filled 2021-01-14: qty 1

## 2021-01-14 MED ORDER — ACETAMINOPHEN 500 MG PO TABS: 1000.0000 mg | ORAL_TABLET | Freq: Once | ORAL | Status: DC | PRN

## 2021-01-14 MED ORDER — MAGIC MOUTHWASH
15.0000 mL | Freq: Four times a day (QID) | ORAL | Status: DC | PRN
  Filled 2021-01-14: qty 15

## 2021-01-14 MED ORDER — OXYCODONE HCL 5 MG PO TABS
5.0000 mg | ORAL_TABLET | ORAL | Status: DC | PRN
  Filled 2021-01-14: qty 2

## 2021-01-14 MED ORDER — SODIUM CHLORIDE 0.9 % IV SOLN
8.0000 mg | Freq: Four times a day (QID) | INTRAVENOUS | Status: DC | PRN
  Filled 2021-01-14: qty 4

## 2021-01-14 MED ORDER — PROMETHAZINE HCL 25 MG/ML IJ SOLN
INTRAMUSCULAR | Status: AC
  Administered 2021-01-14: 12:00:00 6.25 mg via INTRAVENOUS
  Filled 2021-01-14: qty 1

## 2021-01-14 MED ORDER — ONDANSETRON HCL 4 MG/2ML IJ SOLN
INTRAMUSCULAR | Status: DC | PRN
  Administered 2021-01-14: 4 mg via INTRAVENOUS

## 2021-01-14 MED ORDER — MIDAZOLAM HCL 2 MG/2ML IJ SOLN
INTRAMUSCULAR | Status: AC
  Filled 2021-01-14: qty 2

## 2021-01-14 MED ORDER — HYDROMORPHONE HCL 1 MG/ML IJ SOLN
0.5000 mg | INTRAMUSCULAR | Status: DC | PRN
Start: 2021-01-14 — End: 2021-01-15

## 2021-01-14 MED ORDER — DEXAMETHASONE SODIUM PHOSPHATE 4 MG/ML IJ SOLN
INTRAMUSCULAR | Status: DC | PRN
  Administered 2021-01-14: 10 mg via INTRAVENOUS

## 2021-01-14 MED ORDER — BUPIVACAINE-EPINEPHRINE 0.25% -1:200000 IJ SOLN
INTRAMUSCULAR | Status: DC | PRN
  Administered 2021-01-14: 30 mL

## 2021-01-14 MED ORDER — ONDANSETRON HCL 4 MG/2ML IJ SOLN: 4.0000 mg | Freq: Four times a day (QID) | INTRAMUSCULAR | Status: DC | PRN

## 2021-01-14 MED ORDER — DIPHENHYDRAMINE HCL 25 MG PO CAPS: 25.0000 mg | ORAL_CAPSULE | Freq: Four times a day (QID) | ORAL | Status: DC | PRN

## 2021-01-14 MED ORDER — BUPIVACAINE LIPOSOME 1.3 % IJ SUSP
INTRAMUSCULAR | Status: DC | PRN
  Administered 2021-01-14: 20 mL

## 2021-01-14 MED ORDER — PIPERACILLIN-TAZOBACTAM 3.375 G IVPB
3.3750 g | Freq: Three times a day (TID) | INTRAVENOUS | Status: DC
  Administered 2021-01-14 – 2021-01-15 (×2): 3.375 g via INTRAVENOUS
  Filled 2021-01-14 (×2): qty 50

## 2021-01-14 MED ORDER — MORPHINE SULFATE (PF) 4 MG/ML IV SOLN: 4.0000 mg | INTRAVENOUS | Status: DC | PRN

## 2021-01-14 MED ORDER — PROPOFOL 10 MG/ML IV BOLUS
INTRAVENOUS | Status: AC
  Filled 2021-01-14: qty 20

## 2021-01-14 MED ORDER — ONDANSETRON 4 MG PO TBDP: ORAL_TABLET | ORAL | 0 refills | Status: DC

## 2021-01-14 MED ORDER — ACETAMINOPHEN 500 MG PO TABS
ORAL_TABLET | ORAL | Status: AC
  Filled 2021-01-14: qty 2

## 2021-01-14 MED ORDER — SUCRALFATE 1 GM/10ML PO SUSP
1.0000 g | Freq: Once | ORAL | Status: AC
  Administered 2021-01-14: 06:00:00 1 g via ORAL
  Filled 2021-01-14: qty 10

## 2021-01-14 MED ORDER — SUGAMMADEX SODIUM 200 MG/2ML IV SOLN
INTRAVENOUS | Status: DC | PRN
  Administered 2021-01-14: 200 mg via INTRAVENOUS

## 2021-01-14 MED ORDER — SODIUM CHLORIDE 0.9 % IV BOLUS
1000.0000 mL | Freq: Once | INTRAVENOUS | Status: AC
  Administered 2021-01-14: 09:00:00 1000 mL via INTRAVENOUS

## 2021-01-14 MED ORDER — SUCCINYLCHOLINE CHLORIDE 200 MG/10ML IV SOSY
PREFILLED_SYRINGE | INTRAVENOUS | Status: AC
  Filled 2021-01-14: qty 10

## 2021-01-14 MED ORDER — PHENOL 1.4 % MT LIQD: 2.0000 | OROMUCOSAL | Status: DC | PRN

## 2021-01-14 MED ORDER — GABAPENTIN 300 MG PO CAPS
ORAL_CAPSULE | ORAL | Status: AC
  Filled 2021-01-14: qty 1

## 2021-01-14 MED ORDER — OXYCODONE HCL 5 MG PO TABS
5.0000 mg | ORAL_TABLET | Freq: Once | ORAL | Status: AC | PRN
  Administered 2021-01-14: 16:00:00 5 mg via ORAL

## 2021-01-14 MED ORDER — LACTATED RINGERS IV BOLUS: 1000.0000 mL | Freq: Three times a day (TID) | INTRAVENOUS | Status: DC | PRN

## 2021-01-14 MED ORDER — SIMETHICONE 40 MG/0.6ML PO SUSP
80.0000 mg | Freq: Four times a day (QID) | ORAL | Status: DC | PRN
  Filled 2021-01-14: qty 1.2

## 2021-01-14 MED ORDER — PROPOFOL 10 MG/ML IV BOLUS
INTRAVENOUS | Status: DC | PRN
  Administered 2021-01-14: 160 mg via INTRAVENOUS

## 2021-01-14 MED ORDER — KETOROLAC TROMETHAMINE 30 MG/ML IJ SOLN
INTRAMUSCULAR | Status: DC | PRN
  Administered 2021-01-14: 30 mg via INTRAVENOUS

## 2021-01-14 MED ORDER — LACTATED RINGERS IR SOLN
Status: DC | PRN
  Administered 2021-01-14: 1000 mL

## 2021-01-14 MED ORDER — DEXAMETHASONE SODIUM PHOSPHATE 10 MG/ML IJ SOLN
INTRAMUSCULAR | Status: AC
  Filled 2021-01-14: qty 1

## 2021-01-14 MED ORDER — DIPHENHYDRAMINE HCL 50 MG/ML IJ SOLN: 25.0000 mg | Freq: Four times a day (QID) | INTRAMUSCULAR | Status: DC | PRN

## 2021-01-14 MED ORDER — FAMOTIDINE 20 MG PO TABS: 20.0000 mg | ORAL_TABLET | Freq: Every day | ORAL | Status: DC | PRN

## 2021-01-14 MED ORDER — LIP MEDEX EX OINT
1.0000 "application " | TOPICAL_OINTMENT | Freq: Two times a day (BID) | CUTANEOUS | Status: DC
  Administered 2021-01-14: 1 via TOPICAL
  Filled 2021-01-14: qty 7

## 2021-01-14 MED ORDER — FENTANYL CITRATE (PF) 100 MCG/2ML IJ SOLN
INTRAMUSCULAR | Status: DC | PRN
  Administered 2021-01-14: 100 ug via INTRAVENOUS
  Administered 2021-01-14: 50 ug via INTRAVENOUS

## 2021-01-14 MED ORDER — ENSURE PRE-SURGERY PO LIQD
296.0000 mL | Freq: Once | ORAL | Status: DC
  Filled 2021-01-14: qty 296

## 2021-01-14 MED ORDER — METOPROLOL TARTRATE 5 MG/5ML IV SOLN: 5.0000 mg | Freq: Four times a day (QID) | INTRAVENOUS | Status: DC | PRN

## 2021-01-14 MED ORDER — CELECOXIB 200 MG PO CAPS
ORAL_CAPSULE | ORAL | Status: AC
  Filled 2021-01-14: qty 1

## 2021-01-14 MED ORDER — ONDANSETRON 4 MG PO TBDP: 4.0000 mg | ORAL_TABLET | Freq: Four times a day (QID) | ORAL | Status: DC | PRN

## 2021-01-14 MED ORDER — 0.9 % SODIUM CHLORIDE (POUR BTL) OPTIME
TOPICAL | Status: DC | PRN
  Administered 2021-01-14: 13:00:00 1000 mL

## 2021-01-14 MED ORDER — PROCHLORPERAZINE EDISYLATE 10 MG/2ML IJ SOLN
5.0000 mg | INTRAMUSCULAR | Status: DC | PRN
Start: 2021-01-14 — End: 2021-01-15

## 2021-01-14 MED ORDER — CALCIUM CARBONATE ANTACID 500 MG PO CHEW: 1.0000 | CHEWABLE_TABLET | Freq: Every day | ORAL | Status: DC | PRN

## 2021-01-14 MED ORDER — ONDANSETRON HCL 4 MG/2ML IJ SOLN
INTRAMUSCULAR | Status: AC
  Filled 2021-01-14: qty 2

## 2021-01-14 MED ORDER — POLYETHYLENE GLYCOL 3350 17 G PO PACK: 17.0000 g | PACK | Freq: Every day | ORAL | Status: DC | PRN

## 2021-01-14 SURGICAL SUPPLY — 43 items
APPLIER CLIP 5 13 M/L LIGAMAX5 (MISCELLANEOUS) ×3
BAG COUNTER SPONGE SURGICOUNT (BAG) IMPLANT
BAG SURGICOUNT SPONGE COUNTING (BAG)
CABLE HIGH FREQUENCY MONO STRZ (ELECTRODE) ×3 IMPLANT
CHLORAPREP W/TINT 26 (MISCELLANEOUS) ×3 IMPLANT
CLIP APPLIE 5 13 M/L LIGAMAX5 (MISCELLANEOUS) ×1 IMPLANT
COVER MAYO STAND STRL (DRAPES) ×1 IMPLANT
COVER SURGICAL LIGHT HANDLE (MISCELLANEOUS) ×3 IMPLANT
DECANTER SPIKE VIAL GLASS SM (MISCELLANEOUS) ×1 IMPLANT
DRAIN CHANNEL 19F RND (DRAIN) IMPLANT
DRAPE C-ARM 42X120 X-RAY (DRAPES) ×3 IMPLANT
DRAPE WARM FLUID 44X44 (DRAPES) ×3 IMPLANT
DRSG TEGADERM 4X4.75 (GAUZE/BANDAGES/DRESSINGS) ×3 IMPLANT
ELECT REM PT RETURN 15FT ADLT (MISCELLANEOUS) ×3 IMPLANT
ENDOLOOP SUT PDS II  0 18 (SUTURE) ×2
ENDOLOOP SUT PDS II 0 18 (SUTURE) IMPLANT
EVACUATOR SILICONE 100CC (DRAIN) IMPLANT
GAUZE SPONGE 2X2 8PLY STRL LF (GAUZE/BANDAGES/DRESSINGS) ×1 IMPLANT
GLOVE SURG NEOPR MICRO LF SZ8 (GLOVE) ×3 IMPLANT
GLOVE SURG UNDER LTX SZ8 (GLOVE) ×3 IMPLANT
GOWN STRL REUS W/TWL XL LVL3 (GOWN DISPOSABLE) ×10 IMPLANT
IRRIG SUCT STRYKERFLOW 2 WTIP (MISCELLANEOUS) ×3
IRRIGATION SUCT STRKRFLW 2 WTP (MISCELLANEOUS) ×1 IMPLANT
KIT BASIN OR (CUSTOM PROCEDURE TRAY) ×3 IMPLANT
KIT TURNOVER KIT A (KITS) ×2 IMPLANT
PAD POSITIONING PINK XL (MISCELLANEOUS) ×3 IMPLANT
PENCIL SMOKE EVACUATOR (MISCELLANEOUS) IMPLANT
POUCH RETRIEVAL ECOSAC 10 (ENDOMECHANICALS) IMPLANT
POUCH RETRIEVAL ECOSAC 10MM (ENDOMECHANICALS) ×2
PROTECTOR NERVE ULNAR (MISCELLANEOUS) IMPLANT
SCISSORS LAP 5X35 DISP (ENDOMECHANICALS) ×3 IMPLANT
SET CHOLANGIOGRAPH MIX (MISCELLANEOUS) ×3 IMPLANT
SET TUBE SMOKE EVAC HIGH FLOW (TUBING) ×3 IMPLANT
SHEARS HARMONIC ACE PLUS 36CM (ENDOMECHANICALS) ×3 IMPLANT
SPONGE GAUZE 2X2 STER 10/PKG (GAUZE/BANDAGES/DRESSINGS) ×2
SUT MNCRL AB 4-0 PS2 18 (SUTURE) ×3 IMPLANT
SUT PDS AB 1 CT1 27 (SUTURE) ×10 IMPLANT
SYR 20ML LL LF (SYRINGE) ×3 IMPLANT
TOWEL OR 17X26 10 PK STRL BLUE (TOWEL DISPOSABLE) ×3 IMPLANT
TOWEL OR NON WOVEN STRL DISP B (DISPOSABLE) ×3 IMPLANT
TRAY LAPAROSCOPIC (CUSTOM PROCEDURE TRAY) ×3 IMPLANT
TROCAR BLADELESS OPT 5 100 (ENDOMECHANICALS) ×3 IMPLANT
TROCAR BLADELESS OPT 5 150 (ENDOMECHANICALS) ×3 IMPLANT

## 2021-01-14 NOTE — ED Triage Notes (Signed)
Pt reports recently being seen for upper abdominal pain. Sts she was given "Pepcid" and sent  home almost pain free. About an hour after returning home pain returned worse than before.

## 2021-01-14 NOTE — H&P (Signed)
H&P Note  Shelby Cunningham January 14, 1986  [id number].    Requesting MD: Regan Lemming, MD Chief Complaint/Reason for Consult: acute cholecystitis   HPI:  Patient is a 36 year old female who presented to [facility] with abdominal pain x4 days. Pain is located across upper abdomen under breasts bilaterally and radiates down and around to her back. She reports one episode of previous similar pain but it resolved on its own after a few hours. She reports nausea and vomiting 2 days ago but none currently. She has not been taking in much. Reports chills and some constipation at well. Denies chest pain, SOB, urinary symptoms, diarrhea. PMH otherwise significant for brain aneurysm which she is surveilled for. No prior abdominal surgery. She is not on any blood thinning medications. NKDA. 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   ROS: Review of Systems  Constitutional:  Positive for chills. Negative for fever.  Respiratory:  Negative for shortness of breath and wheezing.   Cardiovascular:  Negative for chest pain and palpitations.  Gastrointestinal:  Positive for abdominal pain, constipation, nausea and vomiting. Negative for diarrhea.  Genitourinary:  Negative for dysuria, frequency and urgency.  All other systems reviewed and are negative.  Family History  Problem Relation Age of Onset   Diabetes Mother    Diabetes Paternal Uncle     Past Medical History:  Diagnosis Date   Aneurysm ([place])    Brain - has checked every 6 months   Exercise-induced shortness of breath    "sometimes at the gym" (01/30/2012)   No pertinent past medical history    Status post vacuum-assisted vaginal delivery 11/29/2016   TIA (transient ischemic attack) 01/14    Past Surgical History:  Procedure Laterality Date   NO PAST SURGERIES     TEE WITHOUT CARDIOVERSION  01/31/2012   Procedure: TRANSESOPHAGEAL ECHOCARDIOGRAM (TEE);  Surgeon: Lelon Perla, MD;  Location: [facility] ENDOSCOPY;   Service: Cardiovascular;  Laterality: N/A;    Social History:  reports that she has never smoked. She has never used smokeless tobacco. She reports that she does not drink alcohol and does not use drugs.  Allergies: No Known Allergies  (Not in a hospital admission)   Blood pressure 118/78, pulse 80, temperature 98.9 F (37.2 C), temperature source Oral, resp. rate 18, height 5\' 6"  (1.676 m), weight 90.7 kg, SpO2 100 %. Physical Exam:  General: pleasant, WD, overweight female who is laying in bed in NAD HEENT: head is normocephalic, atraumatic.  Sclera are anicteric. Pupils are equal and round.  Ears and nose without any masses or lesions.  Mouth is pink and moist Heart: regular, rate, and rhythm.  Normal s1,s2. No obvious murmurs, gallops, or rubs noted.  Palpable radial and pedal pulses bilaterally Lungs: CTAB, no wheezes, rhonchi, or rales noted.  Respiratory effort nonlabored Abd: soft, mild ttp of RUQ and epigastrium (pt had just gotten pain medication), ND, +BS, no masses, hernias, or organomegaly MS: all 4 extremities are symmetrical with no cyanosis, clubbing, or edema. Skin: warm and dry with no masses, lesions, or rashes Neuro: Cranial nerves 2-12 grossly intact, sensation is normal throughout Psych: A&Ox3 with an appropriate affect.   Results for orders placed or performed during the hospital encounter of 01/14/21 (from the past 48 hour(s))  Comprehensive metabolic panel     Status: Abnormal   Collection Time: 01/14/21  9:13 AM  Result Value Ref Range   Sodium  139 135 - 145 mmol/L   Potassium 4.2 3.5 - 5.1 mmol/L   Chloride 105 98 - 111 mmol/L   CO2 25 22 - 32 mmol/L   Glucose, Bld 107 (H) 70 - 99 mg/dL    Comment: Glucose reference range applies only to samples taken after fasting for at least 8 hours.   BUN 10 6 - 20 mg/dL   Creatinine, Ser 0.76 0.44 - 1.00 mg/dL   Calcium 9.2 8.9 - 10.3 mg/dL   Total Protein 8.0 6.5 - 8.1 g/dL   Albumin 4.3 3.5 - 5.0 g/dL   AST 21  15 - 41 U/L   ALT 26 0 - 44 U/L   Alkaline Phosphatase 108 38 - 126 U/L   Total Bilirubin 0.6 0.3 - 1.2 mg/dL   GFR, Estimated >60 >60 mL/min    Comment: (NOTE) Calculated using the CKD-EPI Creatinine Equation (2021)    Anion gap 9 5 - 15    Comment: Performed at [facility], [place] [address], [place], [place] [address]  Lipase, blood     Status: None   Collection Time: 01/14/21  9:13 AM  Result Value Ref Range   Lipase 30 11 - 51 U/L    Comment: Performed at [facility], [place] [address] [place], [place] [address]  CBC with Differential     Status: Abnormal   Collection Time: 01/14/21  9:13 AM  Result Value Ref Range   WBC 13.7 (H) 4.0 - 10.5 K/uL   RBC 5.31 (H) 3.87 - 5.11 MIL/uL   Hemoglobin 14.5 12.0 - 15.0 g/dL   HCT 44.2 36.0 - 46.0 %   MCV 83.2 80.0 - 100.0 fL   MCH 27.3 26.0 - 34.0 pg   MCHC 32.8 30.0 - 36.0 g/dL   RDW 12.6 11.5 - 15.5 %   Platelets 310 150 - 400 K/uL   nRBC 0.0 0.0 - 0.2 %   Neutrophils Relative % 82 %   Neutro Abs 11.2 (H) 1.7 - 7.7 K/uL   Lymphocytes Relative 13 %   Lymphs Abs 1.7 0.7 - 4.0 K/uL   Monocytes Relative 4 %   Monocytes Absolute 0.5 0.1 - 1.0 K/uL   Eosinophils Relative 0 %   Eosinophils Absolute 0.1 0.0 - 0.5 K/uL   Basophils Relative 1 %   Basophils Absolute 0.1 0.0 - 0.1 K/uL   Immature Granulocytes 0 %   Abs Immature Granulocytes 0.04 0.00 - 0.07 K/uL    Comment: Performed at [facility], [place] [address] [place], [place] [address]   US Abdomen Limited  Result Date: 01/14/2021 CLINICAL DATA:  Right upper quadrant pain EXAM: ULTRASOUND ABDOMEN LIMITED RIGHT UPPER QUADRANT COMPARISON:  None. FINDINGS: Gallbladder: 3.3 cm gallstone. Wall thickening measuring up to 6 mm. No pericholecystic fluid. Positive sonographic Murphy sign noted by sonographer. Common bile duct: Diameter: 5 mm, within normal limits Liver: Increased parenchymal echogenicity. There are 2 relatively  hypoechoic areas in the right lobe measuring 2.9 and 3.7 cm. Portal vein is patent on color Doppler imaging with normal direction of blood flow towards the liver. Other: None. IMPRESSION: Cholelithiasis with gallbladder wall thickening and positive sonographic Murphy sign suggesting acute cholecystitis. There is no pericholecystic fluid. Mildly increased liver echogenicity likely reflects steatosis. There are 2 relative hypoechoic areas in the right hepatic lobe that may reflect fatty spurring or lesions. Recommend nonemergent evaluation with MRI as an outpatient. These results were called by telephone at the time of  interpretation on 01/14/2021 at 10:09 am to provider Deno Etienne , who verbally acknowledged these results. Electronically Signed   By: Macy Mis M.D.   On: 01/14/2021 10:13      Assessment/Plan Acute cholecystitis  - RUQ [place] with cholelithiasis, gallbladder wall thickening and positive sonographic murphy sign  - WBC mildly elevated at 13.7, afebrile  - LFTs all WNL and lipase not elevated  - to OR for laparoscopic cholecystectomy, will likely need admission to observation post-op   FEN: NPO, IVF VTE: SCDs ID: [email or web address] ordered   Hx of brain aneurysm - surveillance   Norm Parcel, [facility] Surgery 01/14/2021, 11:22 AM Please see Amion for pager number during day hours 7:00am-4:30pm

## 2021-01-14 NOTE — Progress Notes (Signed)
Patient in PACU recovery room, smiling.  Nurse at bedside.  She is feeling better.  I updated operative findings.  Discussed postop plan and recommendations.  Questions answered.  She expressed understanding and appreciation

## 2021-01-14 NOTE — ED Notes (Signed)
Patient transported to pre op by RN.

## 2021-01-14 NOTE — Transfer of Care (Signed)
Immediate Anesthesia Transfer of Care Note  Patient: Shelby Cunningham  Procedure(s) Performed: LAPAROSCOPIC CHOLECYSTECTOMY SINGLE SITE (Abdomen)  Patient Location: PACU  Anesthesia Type:General  Level of Consciousness: sedated, patient cooperative and responds to stimulation  Airway & Oxygen Therapy: Patient Spontanous Breathing and Patient connected to face mask oxygen  Post-op Assessment: Report given to RN and Post -op Vital signs reviewed and stable  Post vital signs: Reviewed and stable  Last Vitals:  Vitals Value Taken Time  BP 138/74 01/14/21 1432  Temp 36.7 C 01/14/21 1430  Pulse 100 01/14/21 1436  Resp 11 01/14/21 1436  SpO2 98 % 01/14/21 1436  Vitals shown include unvalidated device data.  Last Pain:  Vitals:   01/14/21 1430  TempSrc:   PainSc: Asleep      Patients Stated Pain Goal: 3 (16/10/96 0454)  Complications: No notable events documented.

## 2021-01-14 NOTE — Interval H&P Note (Signed)
History and Physical Interval Note:  01/14/2021 12:40 PM  Shelby Cunningham  has presented today for surgery, with the diagnosis of CHOLECYSTITIS.  The various methods of treatment have been discussed with the patient and family. After consideration of risks, benefits and other options for treatment, the patient has consented to  Procedure(s): [place] CHOLANGIOGRAM (N/A) as a surgical intervention.  The patient's history has been reviewed, patient examined, no change in status, stable for surgery.  I have reviewed the patient's chart and labs.  Questions were answered to the patient's satisfaction.    I have re-reviewed the the patient's records, history, medications, and allergies.  I have re-examined the patient.  I again discussed intraoperative plans and goals of post-operative recovery.  The patient agrees to proceed.  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   Suicidal ideations    MDD (major depressive disorder), severe ([place]) 01/26/2017   Brain aneurysm 01/13/2017   TIA (transient ischemic attack) 01/29/2012   Hemiplegia affecting right dominant side ([place]) 01/29/2012    Past Medical History:  Diagnosis Date   Aneurysm ([place])    Brain - has checked every 6 months   Exercise-induced shortness of breath    "sometimes at the gym" (01/30/2012)   No pertinent past medical history    Status post vacuum-assisted vaginal delivery 11/29/2016   TIA (transient ischemic attack) 01/14    Past Surgical History:  Procedure Laterality Date   NO PAST SURGERIES     TEE WITHOUT CARDIOVERSION  01/31/2012   Procedure: TRANSESOPHAGEAL ECHOCARDIOGRAM (TEE);  Surgeon: Lelon Perla, MD;  Location: [facility] ENDOSCOPY;  Service: Cardiovascular;  Laterality: N/A;    Social  History   Socioeconomic History   Marital status: Married    Spouse name: Not on file   Number of children: Not on file   Years of education: Not on file   Highest education level: Not on file  Occupational History   Not on file  Tobacco Use   Smoking status: Never   Smokeless tobacco: Never  Vaping Use   Vaping Use: Never used  Substance and Sexual Activity   Alcohol use: No    Alcohol/week: 1.0 standard drink    Types: 1 Shots of liquor per week    Comment: 01/30/2012 "3 drinks a month"   Drug use: No   Sexual activity: Yes    Birth control/protection: Condom  Other Topics Concern   Not on file  Social History Narrative   Not on file   Social Determinants of Health   Financial Resource Strain: Not on file  Food Insecurity: Not on file  Transportation Needs: Not on file  Physical Activity: Not on file  Stress: Not on file  Social Connections: Not on file  Intimate Partner Violence: Not on file    Family History  Problem Relation Age of Onset   Diabetes Mother    Diabetes Paternal Uncle     Medications Prior to Admission  Medication Sig Dispense Refill Last Dose   calcium carbonate (TUMS - DOSED IN MG ELEMENTAL CALCIUM) 500 MG chewable tablet Chew 1 tablet by mouth daily as needed for indigestion or heartburn.      famotidine (PEPCID) 20 MG tablet Take 20 mg by mouth daily as needed for heartburn or indigestion.  famotidine (PEPCID) 20 MG tablet Take 1 tablet (20 mg total) by mouth 2 (two) times daily. 30 tablet 0    ondansetron (ZOFRAN-ODT) 4 MG disintegrating tablet 4mg  ODT q6 hours prn nausea/vomit 4 tablet 0     Current Facility-Administered Medications  Medication Dose Route Frequency Provider Last Rate Last Admin   acetaminophen (TYLENOL) 500 MG tablet            acetaminophen (TYLENOL) tablet 1,000 mg  1,000 mg Oral On Call to OR Michael [place], MD       bupivacaine liposome (EXPAREL) 1.3 % injection 266 mg  20 mL Infiltration Once Michael [place], MD        cefTRIAXone (ROCEPHIN) 2 g in sodium chloride 0.9 % 100 mL IVPB  2 g Intravenous On Call to OR Michael [place], MD       celecoxib (CELEBREX) capsule 200 mg  200 mg Oral On Call to OR Michael [place], MD       Chlorhexidine Gluconate Cloth 2 % PADS 6 each  6 each Topical Once Michael [place], MD       And   Chlorhexidine Gluconate Cloth 2 % PADS 6 each  6 each Topical Once Michael [place], MD       Derrill Memo ON 01/15/2021] feeding supplement (ENSURE PRE-SURGERY) liquid 296 mL  296 mL Oral Once Michael [place], MD       gabapentin (NEURONTIN) capsule 300 mg  300 mg Oral On Call to OR Michael [place], MD       lactated ringers infusion   Intravenous Continuous Oleta Mouse, MD 20 mL/hr at 01/14/21 1234 New Bag at 01/14/21 1234   [MAR Hold] piperacillin-tazobactam (ZOSYN) IVPB 3.375 g  3.375 g Intravenous Q8H Johnson, Kelly R, PA-C       sodium chloride 0.9 % with cefTRIAXone (ROCEPHIN) ADS Med              No Known Allergies  BP 127/81    Pulse 66    Temp 98.1 F (36.7 C) (Oral)    Resp 18    Ht 5\' 6"  (1.676 m)    Wt 90.7 kg    LMP  (LMP Unknown)    SpO2 96%    BMI 32.28 kg/m   Labs: Results for orders placed or performed during the hospital encounter of 01/14/21 (from the past 48 hour(s))  Comprehensive metabolic panel     Status: Abnormal   Collection Time: 01/14/21  9:13 AM  Result Value Ref Range   Sodium 139 135 - 145 mmol/L   Potassium 4.2 3.5 - 5.1 mmol/L   Chloride 105 98 - 111 mmol/L   CO2 25 22 - 32 mmol/L   Glucose, Bld 107 (H) 70 - 99 mg/dL    Comment: Glucose reference range applies only to samples taken after fasting for at least 8 hours.   BUN 10 6 - 20 mg/dL   Creatinine, Ser 0.76 0.44 - 1.00 mg/dL   Calcium 9.2 8.9 - 10.3 mg/dL   Total Protein 8.0 6.5 - 8.1 g/dL   Albumin 4.3 3.5 - 5.0 g/dL   AST 21 15 - 41 U/L   ALT 26 0 - 44 U/L   Alkaline Phosphatase 108 38 - 126 U/L   Total Bilirubin 0.6 0.3 - 1.2 mg/dL   GFR, Estimated >60 >60 mL/min    Comment:  (NOTE) Calculated using the CKD-EPI Creatinine Equation (2021)    Anion gap 9 5 - 15    Comment: Performed at  [facility], [place] [address], [place], [place] [address]  Lipase, blood     Status: None   Collection Time: 01/14/21  9:13 AM  Result Value Ref Range   Lipase 30 11 - 51 U/L    Comment: Performed at [facility], [place] [address], [place], [place] [address]  CBC with Differential     Status: Abnormal   Collection Time: 01/14/21  9:13 AM  Result Value Ref Range   WBC 13.7 (H) 4.0 - 10.5 K/uL   RBC 5.31 (H) 3.87 - 5.11 MIL/uL   Hemoglobin 14.5 12.0 - 15.0 g/dL   HCT 44.2 36.0 - 46.0 %   MCV 83.2 80.0 - 100.0 fL   MCH 27.3 26.0 - 34.0 pg   MCHC 32.8 30.0 - 36.0 g/dL   RDW 12.6 11.5 - 15.5 %   Platelets 310 150 - 400 K/uL   nRBC 0.0 0.0 - 0.2 %   Neutrophils Relative % 82 %   Neutro Abs 11.2 (H) 1.7 - 7.7 K/uL   Lymphocytes Relative 13 %   Lymphs Abs 1.7 0.7 - 4.0 K/uL   Monocytes Relative 4 %   Monocytes Absolute 0.5 0.1 - 1.0 K/uL   Eosinophils Relative 0 %   Eosinophils Absolute 0.1 0.0 - 0.5 K/uL   Basophils Relative 1 %   Basophils Absolute 0.1 0.0 - 0.1 K/uL   Immature Granulocytes 0 %   Abs Immature Granulocytes 0.04 0.00 - 0.07 K/uL    Comment: Performed at [facility], [place] [address] [place], [place] [address]  Resp Panel by RT-PCR (Flu A&B, Covid) Nasopharyngeal Swab     Status: None   Collection Time: 01/14/21 10:51 AM   Specimen: Nasopharyngeal Swab; Nasopharyngeal(NP) swabs in vial transport medium  Result Value Ref Range   SARS Coronavirus 2 by RT PCR NEGATIVE NEGATIVE    Comment: (NOTE) SARS-CoV-2 target nucleic acids are NOT DETECTED.  The SARS-CoV-2 RNA is generally detectable in upper respiratory specimens during the acute phase of infection. The lowest concentration of SARS-CoV-2 viral copies this assay can detect is 138 copies/mL. A negative result does not preclude SARS-Cov-2 infection  and should not be used as the sole basis for treatment or other patient management decisions. A negative result may occur with  improper specimen collection/handling, submission of specimen other than nasopharyngeal swab, presence of viral mutation(s) within the areas targeted by this assay, and inadequate number of viral copies(<138 copies/mL). A negative result must be combined with clinical observations, patient history, and epidemiological information. The expected result is Negative.  Fact Sheet for Patients:  EntrepreneurPulse.com.au  Fact Sheet for Healthcare Providers:  IncredibleEmployment.be  This test is no t yet approved or cleared by the [place] FDA and  has been authorized for detection and/or diagnosis of SARS-CoV-2 by FDA under an Emergency Use Authorization (EUA). This EUA will remain  in effect (meaning this test can be used) for the duration of the COVID-19 declaration under Section 564(b)(1) of the Act, 21 U.S.C.section 360bbb-3(b)(1), unless the authorization is terminated  or revoked sooner.       Influenza A by PCR NEGATIVE NEGATIVE   Influenza B by PCR NEGATIVE NEGATIVE    Comment: (NOTE) The Xpert Xpress SARS-CoV-2/FLU/RSV plus assay is intended as an aid in the diagnosis of influenza from Nasopharyngeal swab specimens and should not be used as a sole basis for treatment. Nasal washings and aspirates are unacceptable for Xpert Xpress SARS-CoV-2/FLU/RSV testing.  Fact  Sheet for Patients: EntrepreneurPulse.com.au  Fact Sheet for Healthcare Providers: IncredibleEmployment.be  This test is not yet approved or cleared by the [place] FDA and has been authorized for detection and/or diagnosis of SARS-CoV-2 by FDA under an Emergency Use Authorization (EUA). This EUA will remain in effect (meaning this test can be used) for the duration of the COVID-19 declaration under  Section 564(b)(1) of the Act, 21 U.S.C. section 360bbb-3(b)(1), unless the authorization is terminated or revoked.  Performed at [facility], [place] [address] [place], [place] [address]     Imaging / Studies: US Abdomen Limited  Result Date: 01/14/2021 CLINICAL DATA:  Right upper quadrant pain EXAM: ULTRASOUND ABDOMEN LIMITED RIGHT UPPER QUADRANT COMPARISON:  None. FINDINGS: Gallbladder: 3.3 cm gallstone. Wall thickening measuring up to 6 mm. No pericholecystic fluid. Positive sonographic Murphy sign noted by sonographer. Common bile duct: Diameter: 5 mm, within normal limits Liver: Increased parenchymal echogenicity. There are 2 relatively hypoechoic areas in the right lobe measuring 2.9 and 3.7 cm. Portal vein is patent on color Doppler imaging with normal direction of blood flow towards the liver. Other: None. IMPRESSION: Cholelithiasis with gallbladder wall thickening and positive sonographic Murphy sign suggesting acute cholecystitis. There is no pericholecystic fluid. Mildly increased liver echogenicity likely reflects steatosis. There are 2 relative hypoechoic areas in the right hepatic lobe that may reflect fatty spurring or lesions. Recommend nonemergent evaluation with MRI as an outpatient. These results were called by telephone at the time of interpretation on 01/14/2021 at 10:09 am to provider Deno Etienne , who verbally acknowledged these results. Electronically Signed   By: Macy Mis M.D.   On: 01/14/2021 10:13     .Adin Hector, M.D., F.A.C.S. Gastrointestinal and Minimally Invasive Surgery [facility] [address], [place], [place] [address] [phone] Main / Paging  01/14/2021 12:40 PM    Adin Hector

## 2021-01-14 NOTE — Discharge Instructions (Addendum)
Please refer to the attached instructions 

## 2021-01-14 NOTE — Anesthesia Postprocedure Evaluation (Signed)
Anesthesia Post Note  Patient: Shelby Cunningham  Procedure(s) Performed: LAPAROSCOPIC CHOLECYSTECTOMY SINGLE SITE (Abdomen)     Patient location during evaluation: PACU Anesthesia Type: General Level of consciousness: awake and alert Pain management: pain level controlled Vital Signs Assessment: post-procedure vital signs reviewed and stable Respiratory status: spontaneous breathing, nonlabored ventilation, respiratory function stable and patient connected to nasal cannula oxygen Cardiovascular status: blood pressure returned to baseline and stable Postop Assessment: no apparent nausea or vomiting Anesthetic complications: no   No notable events documented.  Last Vitals:  Vitals:   01/14/21 1646 01/14/21 1736  BP: 128/86 121/81  Pulse: 88 92  Resp: 16 16  Temp: 36.9 C 36.7 C  SpO2: 99% 96%    Last Pain:  Vitals:   01/14/21 1736  TempSrc: Oral  PainSc:                  Purl Claytor

## 2021-01-14 NOTE — Discharge Instructions (Signed)
[facility] LAPAROSCOPIC SURGERY: POST OP INSTRUCTIONS Always review your discharge instruction sheet given to you by the facility where your surgery was performed. IF YOU HAVE DISABILITY OR FAMILY LEAVE FORMS, YOU MUST BRING THEM TO THE OFFICE FOR PROCESSING.   DO NOT GIVE THEM TO YOUR DOCTOR.  PAIN CONTROL  First take acetaminophen (Tylenol) AND/or ibuprofen (Advil) to control your pain after surgery.  Follow directions on package.  Taking acetaminophen (Tylenol) and/or ibuprofen (Advil) regularly after surgery will help to control your pain and lower the amount of prescription pain medication you may need.  You should not take more than 3,000 mg (3 grams) of acetaminophen (Tylenol) in 24 hours.  You should not take ibuprofen (Advil), aleve, motrin, naprosyn or other NSAIDS if you have a history of stomach ulcers or chronic kidney disease.  A prescription for pain medication may be given to you upon discharge.  Take your pain medication as prescribed, if you still have uncontrolled pain after taking acetaminophen (Tylenol) or ibuprofen (Advil). Use ice packs to help control pain. If you need a refill on your pain medication, please contact your pharmacy.  They will contact our office to request authorization. Prescriptions will not be filled after 5pm or on week-ends.  HOME MEDICATIONS Take your usually prescribed medications unless otherwise directed.  DIET You should follow a light diet the first few days after arrival home.  Be sure to include lots of fluids daily. Avoid fatty, fried foods.   CONSTIPATION It is common to experience some constipation after surgery and if you are taking pain medication.  Increasing fluid intake and taking a stool softener (such as Colace) will usually help or prevent this problem from occurring.  A mild laxative (Milk of Magnesia or Miralax) should be taken according to package instructions if there are no bowel movements after 48  hours.  WOUND/INCISION CARE Most patients will experience some swelling and bruising in the area of the incisions.  Ice packs will help.  Swelling and bruising can take several days to resolve.  Unless discharge instructions indicate otherwise, follow guidelines below  STERI-STRIPS - you may remove your outer bandages 48 hours after surgery, and you may shower at that time.  You have steri-strips (small skin tapes) in place directly over the incision.  These strips should be left on the skin for 7-10 days.   DERMABOND/SKIN GLUE - you may shower in 24 hours.  The glue will flake off over the next 2-3 weeks. Any sutures or staples will be removed at the office during your follow-up visit.  ACTIVITIES You may resume regular (light) daily activities beginning the next day--such as daily self-care, walking, climbing stairs--gradually increasing activities as tolerated.  You may have sexual intercourse when it is comfortable.  Refrain from any heavy lifting or straining until approved by your doctor. You may drive when you are no longer taking prescription pain medication, you can comfortably wear a seatbelt, and you can safely maneuver your car and apply brakes.  FOLLOW-UP You should see your doctor in the office for a follow-up appointment approximately 2-3 weeks after your surgery.  You should have been given your post-op/follow-up appointment when your surgery was scheduled.  If you did not receive a post-op/follow-up appointment, make sure that you call for this appointment within a day or two after you arrive home to insure a convenient appointment time.   WHEN TO CALL YOUR DOCTOR: Fever over 101.0 Inability to urinate Continued bleeding from incision.   Increased pain, redness, or drainage from the incision. Increasing abdominal pain  The clinic staff is available to answer your questions during regular business hours.  Please don't hesitate to call and ask to speak to one of the nurses for  clinical concerns.  If you have a medical emergency, go to the nearest emergency room or call 911.  A surgeon from [facility] is always on call at the hospital. [address], [address], [place], [place]  [address] ? [address] [address], [place], [place]   [address] [phone] ? [phone] ? FAX [phone] Web site: [email or web address]      Managing Your Pain After Surgery Without Opioids    Thank you for participating in our program to help patients manage their pain after surgery without opioids. This is part of our effort to provide you with the best care possible, without exposing you or your family to the risk that opioids pose.  What pain can I expect after surgery? You can expect to have some pain after surgery. This is normal. The pain is typically worse the day after surgery, and quickly begins to get better. Many studies have found that many patients are able to manage their pain after surgery with Over-the-Counter (OTC) medications such as Tylenol and Motrin. If you have a condition that does not allow you to take Tylenol or Motrin, notify your surgical team.  How will I manage my pain? The best strategy for controlling your pain after surgery is around the clock pain control with Tylenol (acetaminophen) and Motrin (ibuprofen or Advil). Alternating these medications with each other allows you to maximize your pain control. In addition to Tylenol and Motrin, you can use heating pads or ice packs on your incisions to help reduce your pain.  How will I alternate your regular strength over-the-counter pain medication? You will take a dose of pain medication every three hours. Start by taking 650 mg of Tylenol (2 pills of 325 mg) 3 hours later take 600 mg of Motrin (3 pills of 200 mg) 3 hours after taking the Motrin take 650 mg of Tylenol 3 hours after that take 600 mg of Motrin.   - 1 -  See example - if your first dose of Tylenol is at 12:00  PM   12:00 PM Tylenol 650 mg (2 pills of 325 mg)  3:00 PM Motrin 600 mg (3 pills of 200 mg)  6:00 PM Tylenol 650 mg (2 pills of 325 mg)  9:00 PM Motrin 600 mg (3 pills of 200 mg)  Continue alternating every 3 hours   We recommend that you follow this schedule around-the-clock for at least 3 days after surgery, or until you feel that it is no longer needed. Use the table on the last page of this handout to keep track of the medications you are taking. Important: Do not take more than 3000mg of Tylenol or 3200mg of Motrin in a 24-hour period. Do not take ibuprofen/Motrin if you have a history of bleeding stomach ulcers, severe kidney disease, &/or actively taking a blood thinner  What if I still have pain? If you have pain that is not controlled with the over-the-counter pain medications (Tylenol and Motrin or Advil) you might have what we call "breakthrough" pain. You will receive a prescription for a small amount of an opioid pain medication such as Oxycodone, Tramadol, or Tylenol with Codeine. Use these opioid pills in the first 24 hours after surgery if you have breakthrough pain. Do   not take more than 1 pill every 4-6 hours.  If you still have uncontrolled pain after using all opioid pills, don't hesitate to call our staff using the number provided. We will help make sure you are managing your pain in the best way possible, and if necessary, we can provide a prescription for additional pain medication.   Day 1    Time  Name of Medication Number of pills taken  Amount of Acetaminophen  Pain Level   Comments  AM PM       AM PM       AM PM       AM PM       AM PM       AM PM       AM PM       AM PM       Total Daily amount of Acetaminophen Do not take more than  3,000 mg per day      Day 2    Time  Name of Medication Number of pills taken  Amount of Acetaminophen  Pain Level   Comments  AM PM       AM PM       AM PM       AM PM       AM PM       AM PM       AM  PM       AM PM       Total Daily amount of Acetaminophen Do not take more than  3,000 mg per day      Day 3    Time  Name of Medication Number of pills taken  Amount of Acetaminophen  Pain Level   Comments  AM PM       AM PM       AM PM       AM PM          AM PM       AM PM       AM PM       AM PM       Total Daily amount of Acetaminophen Do not take more than  3,000 mg per day      Day 4    Time  Name of Medication Number of pills taken  Amount of Acetaminophen  Pain Level   Comments  AM PM       AM PM       AM PM       AM PM       AM PM       AM PM       AM PM       AM PM       Total Daily amount of Acetaminophen Do not take more than  3,000 mg per day      Day 5    Time  Name of Medication Number of pills taken  Amount of Acetaminophen  Pain Level   Comments  AM PM       AM PM       AM PM       AM PM       AM PM       AM PM       AM PM       AM PM       Total Daily amount of Acetaminophen Do not take more than    3,000 mg per day       Day 6    Time  Name of Medication Number of pills taken  Amount of Acetaminophen  Pain Level  Comments  AM PM       AM PM       AM PM       AM PM       AM PM       AM PM       AM PM       AM PM       Total Daily amount of Acetaminophen Do not take more than  3,000 mg per day      Day 7    Time  Name of Medication Number of pills taken  Amount of Acetaminophen  Pain Level   Comments  AM PM       AM PM       AM PM       AM PM       AM PM       AM PM       AM PM       AM PM       Total Daily amount of Acetaminophen Do not take more than  3,000 mg per day        For additional information about how and where to safely dispose of unused opioid medications - https://www.morepowerfulnc.org  Disclaimer: This document contains information and/or instructional materials adapted from [facility] for the typical patient with your condition. It does not replace medical advice  from your health care provider because your experience may differ from that of the typical patient. Talk to your health care provider if you have any questions about this document, your condition or your treatment plan. Adapted from [facility]  

## 2021-01-14 NOTE — Op Note (Signed)
01/14/2021  PATIENT:  Shelby Cunningham  35 y.o. female  Patient Care Team: Patient, No Pcp Per (Inactive) as PCP - General (General Practice)  PRE-OPERATIVE DIAGNOSIS:    Acute on Chronic Calculus Cholecystitis  POST-OPERATIVE DIAGNOSIS:   Acute on Chronic Calculus Cholecystitis  PROCEDURE:  SINGLE SITE Laparoscopic cholecystectomy (CPT code [phone])  SURGEON:  Adin Hector, MD, FACS.  ASSISTANT: OR Staff   ANESTHESIA:    General with endotracheal intubation Local anesthetic as a field block  EBL:  (See Anesthesia Intraoperative Record) Total I/O In: 1100 [I.V.:1000; IV Piggyback:100] Out: 25 [Blood:25]  Delay start of Pharmacological VTE agent (>24hrs) due to surgical blood loss or risk of bleeding:  no  DRAINS: None   SPECIMEN: Gallbladder    DISPOSITION OF SPECIMEN:  PATHOLOGY  COUNTS:  YES  PLAN OF CARE: Admit to inpatient   PATIENT DISPOSITION:  PACU - hemodynamically stable.  INDICATION: Pleasant woman with nausea vomiting upper abdominal pain lasting for several days.  Gradually worsening.  Came to emergency department.  Murphy sign with story suspicious of biliary colic.  Ultrasound with gallbladder wall thickening and changes concerning for cholecystitis.  Recommendation made for admission, IV antibiotics, cholecystectomy  The anatomy & physiology of hepatobiliary & pancreatic function was discussed.  The pathophysiology of gallbladder dysfunction was discussed.  Natural history risks without surgery was discussed.   I feel the risks of no intervention will lead to serious problems that outweigh the operative risks; therefore, I recommended cholecystectomy to remove the pathology.  I explained laparoscopic techniques with possible need for an open approach.  Probable cholangiogram to evaluate the bilary tract was explained as well.    Risks such as bleeding, infection, abscess, leak, injury to other organs, need for further treatment, heart attack, death, and  other risks were discussed.  I noted a good likelihood this will help address the problem.  Possibility that this will not correct all abdominal symptoms was explained.  Goals of post-operative recovery were discussed as well.  We will work to minimize complications.  An educational handout further explaining the pathology and treatment options was given as well.  Questions were answered.  The patient expresses understanding & wishes to proceed with surgery.  OR FINDINGS: Thickened gallbladder with edema consistent with acute cholecystitis.  Some adhesions suspicious for some chronic irritation as well.  Brothy milky "white" bile consistent with prolonged cholecystitis.  No ischemia nor necrosis.  Not able to do cholangiogram secondary to some obstruction at the very proximal cystic duct preventing adequate flow/seal for cholangiogram.  Common bile duct not particularly dilated.  2 cystic duct stones milked back out, but persistent = aborted cholangiogram.  Liver: normal.  No strong evidence of steatohepatitis nor cirrhosis.  No superficial liver abnormalities noted.  DESCRIPTION:   The patient was identified & brought in the operating room. The patient was positioned supine with arms tucked. SCDs were active during the entire case. The patient underwent general anesthesia without any difficulty.  The abdomen was prepped and draped in a sterile fashion. A Surgical Timeout confirmed our plan.  I made a transverse curvilinear incision through the superior umbilical fold.  I placed a 29mm long port through the supraumbilical fascia using a modified Hassan cutdown technique with umbilical stalk fascial countertraction. I began carbon dioxide insufflation.  No change in end tidal CO2 measurement.   Camera inspection revealed no injury. There were no adhesions to the anterior abdominal wall supraumbilically.  I proceeded to continue with single site  technique. I placed a #5 port in left upper aspect of the  wound. I placed a 5 mm atraumatic grasper in the right inferior aspect of the wound.  Dr. Marlou Starks was able to come in and be available but did not have to scrub and for the case  I turned attention to the right upper quadrant.  Gallbladder was very distended and could not be grasped so I aspirated gallbladder contents laparoscopic needle suction.  Removed mostly clear colorless but occasionally milky/brought the "white" bile.  Consistent with complete cystic duct obstruction.  Some sludge noted at the end.  This allowed the gallbladder to be better grasped.  The gallbladder fundus was elevated cephalad. I freed adhesions to the ventral surface of the gallbladder off carefully.  I freed the peritoneal coverings between the gallbladder and the liver on the posteriolateral and anteriomedial walls. I alternated between Harmonic & blunt Maryland dissection to help get a good critical view of the cystic artery and cystic duct.  I did further dissection to free 80%of the gallbladder off the liver bed to get a good critical view of the infundibulum and cystic duct. I dissected out the cystic artery; and, after getting a good 360 view, ligated the anterior & posterior branches of the cystic artery close on the infundibulum using the Harmonic ultrasonic dissection.  I skeletonized the cystic duct.  I placed a clip on the infundibulum. I did a partial cystic duct-otomy and ensured patency. I placed a 5 [place] cholangiocatheter through a puncture site at the right subcostal ridge of the abdominal wall and directed it into the cystic duct.  Initially did not pass well.  I milked back a few BB sized spherical stones out of the cystic duct reattempted cannulating but after 2 more attempts and milking I would get immediate return concerning for very proximal cystic duct obstruction.  The common bile duct itself did not seem particularly dilated nor was there any evidence of jaundice.  Her tissues were somewhat edematous and  fragile.  I was reluctant to do more aggressive dissection at this time so I have ordered cholangiogram.  Dr. Marlou Starks agreed.  Freed the gallbladder from its remaining few attachments to the liver bed.  I did ligation at the base of the cystic duct/common bile duct junction using a 0 PDS Endoloop to get a good ligation.  I then placed clips on the cystic duct x4.  I completed cystic duct transection. I freed the gallbladder from its remaining attachments to the liver. I ensured hemostasis on the gallbladder fossa of the liver and elsewhere.   Placed the gallbladder inside and EcoSac under laparoscopic visualization.  It irrigation to confirm good hemostasis and good ligation of the cystic duct stump.  No leaking of bile or other concerns I inspected the rest of the abdomen & detected no injury nor bleeding elsewhere. I removed the gallbladder out the supraumbilical fascia.  I did have to open up the fascia to 2 cm to get the thickened gallbladder wall out.  I closed the fascia transversely using  #1 PDS interrupted stitches. I closed the skin using 4-0 monocryl stitch.  Sterile dressing was applied. The patient was extubated & arrived in the PACU in stable condition..  I had discussed postoperative care with the patient in the holding area. I discussed operative findings, updated the patient's status, discussed probable steps to recovery, and gave postoperative recommendations to the patient's spouse, Dola Factor.  Recommendations were made.  Questions were answered.  He expressed understanding & appreciation.  Adin Hector, M.D., F.A.C.S. Gastrointestinal and Minimally Invasive Surgery [facility] [address], [place], [place] [address] [phone] Main / Paging  01/14/2021 2:28 PM

## 2021-01-14 NOTE — Anesthesia Preprocedure Evaluation (Signed)
Anesthesia Evaluation  Patient identified by MRN, date of birth, ID band Patient awake    Reviewed: Allergy & Precautions, NPO status , Patient's Chart, lab work & pertinent test results  Airway Mallampati: III  TM Distance: >3 FB Neck ROM: Full    Dental  (+) Teeth Intact, Dental Advisory Given   Pulmonary neg pulmonary ROS,    breath sounds clear to auscultation       Cardiovascular negative cardio ROS   Rhythm:Regular     Neuro/Psych PSYCHIATRIC DISORDERS Depression negative neurological ROS     GI/Hepatic negative GI ROS, Neg liver ROS,   Endo/Other  negative endocrine ROS  Renal/GU negative Renal ROS     Musculoskeletal negative musculoskeletal ROS (+)   Abdominal   Peds  Hematology negative hematology ROS (+)   Anesthesia Other Findings   Reproductive/Obstetrics Lab Results      Component                Value               Date                      PREGTESTUR               Negative            03/07/2018                HCG                      <5.0                01/13/2021                                        Anesthesia Physical Anesthesia Plan  ASA: 2  Anesthesia Plan: General   Post-op Pain Management: Ofirmev IV (intra-op) and Toradol IV (intra-op)   Induction: Intravenous, Rapid sequence and Cricoid pressure planned  PONV Risk Score and Plan: 3 and Ondansetron and Dexamethasone  Airway Management Planned: Oral ETT  Additional Equipment: None  Intra-op Plan:   Post-operative Plan: Extubation in OR  Informed Consent: I have reviewed the patients History and Physical, chart, labs and discussed the procedure including the risks, benefits and alternatives for the proposed anesthesia with the patient or authorized representative who has indicated his/her understanding and acceptance.     Dental advisory given  Plan Discussed with: CRNA and  Anesthesiologist  Anesthesia Plan Comments:         Anesthesia Quick Evaluation

## 2021-01-14 NOTE — Anesthesia Procedure Notes (Signed)
Procedure Name: Intubation Date/Time: 01/14/2021 1:00 PM Performed by: Claudia Desanctis, CRNA Pre-anesthesia Checklist: Patient identified, Emergency Drugs available, Suction available and Patient being monitored Patient Re-evaluated:Patient Re-evaluated prior to induction Oxygen Delivery Method: Circle system utilized Preoxygenation: Pre-oxygenation with 100% oxygen Induction Type: IV induction Ventilation: Mask ventilation without difficulty Laryngoscope Size: 2 and Miller Grade View: Grade I Tube type: Oral Tube size: 7.5 mm Number of attempts: 1 Airway Equipment and Method: Stylet Placement Confirmation: ETT inserted through vocal cords under direct vision, positive ETCO2 and breath sounds checked- equal and bilateral Secured at: 21 cm Tube secured with: Tape Dental Injury: Teeth and Oropharynx as per pre-operative assessment

## 2021-01-14 NOTE — ED Provider Notes (Signed)
[facility] DEPT Provider Note   CSN: [id number] Arrival date & time: 01/14/21  0448     History Chief Complaint  Patient presents with   Abdominal Pain    Shelby Cunningham is a 35 y.o. female.  HPI  Patient with medical history including TIA, brain aneurysm presents with  chief complaint of epigastric and right upper quadrant tenderness.  This has been going on for last 4 days, pain came on suddenly, pain was initially intermittent but has now become constant, she describes it as a burning-like sensation, worsened after p.o. intake, she has had associated nausea and vomiting she denies hematemesis or coffee-ground emesis, states this more bilious in nature, she denies  diarrhea but does states she has been slightly constipated she is unsure of when she had her last bowel movement.  She has no significant abdominal history, denies NSAID use or alcohol use, no history of pancreatitis, never had this in the past.  Denies  alleviating factors.     Patient seen here yesterday where lab work was obtained UA showed many red blood cells and bacteria, CBC CMP and lipase were all within normal limits.  Patient was given a GI cocktail felt better and was discharged home.  She states she back today because her symptoms have gotten worse after her arrival home.  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   Suicidal ideations    MDD (major depressive disorder), severe ([place]) 01/26/2017   Brain aneurysm 01/13/2017   TIA (transient ischemic attack) 01/29/2012   Hemiplegia affecting right dominant side ([place]) 01/29/2012     Past Surgical History:  Procedure Laterality Date   NO PAST SURGERIES     TEE WITHOUT CARDIOVERSION  01/31/2012   Procedure: TRANSESOPHAGEAL ECHOCARDIOGRAM (TEE);  Surgeon: Lelon Perla, MD;  Location: [facility] ENDOSCOPY;  Service: Cardiovascular;  Laterality: N/A;     OB History     Gravida  1   Para  1   Term  1   Preterm      AB      Living  1      SAB      IAB      Ectopic      Multiple  0   Live Births  1           Family History  Problem Relation Age of Onset   Diabetes Mother    Diabetes Paternal Uncle     Social History   Tobacco Use   Smoking status: Never   Smokeless tobacco: Never  Vaping Use   Vaping Use: Never used  Substance Use Topics   Alcohol use: No    Alcohol/week: 1.0 standard drink    Types: 1 Shots of liquor per week    Comment: 01/30/2012 "3 drinks a month"   Drug use: No    Home Medications Prior to Admission medications   Medication Sig Start Date End Date Taking? Authorizing Provider  calcium carbonate (TUMS - DOSED IN MG ELEMENTAL CALCIUM) 500 MG chewable tablet Chew 1 tablet by mouth daily as needed for indigestion or  heartburn.    [provider]  famotidine (PEPCID) 20 MG tablet Take 20 mg by mouth daily as needed for heartburn or indigestion.    [provider]  famotidine (PEPCID) 20 MG tablet Take 1 tablet (20 mg total) by mouth 2 (two) times daily. 01/14/21   Etta Quill, NP  ondansetron (ZOFRAN-ODT) 4 MG disintegrating tablet 4mg  ODT q6 hours prn nausea/vomit 01/14/21   Etta Quill, NP    Allergies    Patient has no known allergies.  Review of Systems   Review of Systems  Constitutional:  Negative for chills and fever.  HENT:  Negative for congestion.   Respiratory:  Negative for shortness of breath.   Cardiovascular:  Negative for chest pain.  Gastrointestinal:  Positive for abdominal pain, constipation, nausea and vomiting.  Genitourinary:  Negative for enuresis.  Musculoskeletal:   Negative for back pain.  Skin:  Negative for rash.  Neurological:  Negative for dizziness.  Hematological:  Does not bruise/bleed easily.   Physical Exam Updated Vital Signs BP 118/78    Pulse 80    Temp 98.9 F (37.2 C) (Oral)    Resp 18    Ht 5\' 6"  (1.676 m)    Wt 90.7 kg    LMP  (LMP Unknown)    SpO2 100%    BMI 32.28 kg/m   Physical Exam Vitals and nursing note reviewed.  Constitutional:      General: She is not in acute distress.    Appearance: She is not ill-appearing.  HENT:     Head: Normocephalic and atraumatic.     Nose: No congestion.  Eyes:     Conjunctiva/sclera: Conjunctivae normal.  Cardiovascular:     Rate and Rhythm: Normal rate and regular rhythm.     Pulses: Normal pulses.     Heart sounds: No murmur heard.   No friction rub. No gallop.  Pulmonary:     Effort: No respiratory distress.     Breath sounds: No wheezing, rhonchi or rales.  Abdominal:     General: There is distension.     Palpations: Abdomen is soft.     Tenderness: There is abdominal tenderness. There is no right CVA tenderness or left CVA tenderness.     Comments: Abdomen slightly distended, normal bowel sounds, dull to percussion, she has no tenderness on her right upper quadrant and right lower quadrant, there is no guarding, rebound times, peritoneal sign, negative Murphy sign McBurney point, no Rovsing sign, no CVA tenderness.  Musculoskeletal:     Right lower leg: No edema.     Left lower leg: No edema.  Skin:    General: Skin is warm and dry.  Neurological:     Mental Status: She is alert.  Psychiatric:        Mood and Affect: Mood normal.    ED Results / Procedures / Treatments   Labs (all labs ordered are listed, but only abnormal results are displayed) Labs Reviewed  COMPREHENSIVE METABOLIC PANEL - Abnormal; Notable for the following components:      Result Value   Glucose, Bld 107 (*)    All other components within normal limits  CBC WITH DIFFERENTIAL/PLATELET - Abnormal;  Notable for the following components:   WBC 13.7 (*)    RBC 5.31 (*)    Neutro Abs 11.2 (*)    All other components within normal limits  RESP PANEL BY RT-PCR (FLU A&B, COVID) ARPGX2  LIPASE, BLOOD    EKG None  Radiology  US Abdomen Limited  Result Date: 01/14/2021 CLINICAL DATA:  Right upper quadrant pain EXAM: ULTRASOUND ABDOMEN LIMITED RIGHT UPPER QUADRANT COMPARISON:  None. FINDINGS: Gallbladder: 3.3 cm gallstone. Wall thickening measuring up to 6 mm. No pericholecystic fluid. Positive sonographic Murphy sign noted by sonographer. Common bile duct: Diameter: 5 mm, within normal limits Liver: Increased parenchymal echogenicity. There are 2 relatively hypoechoic areas in the right lobe measuring 2.9 and 3.7 cm. Portal vein is patent on color Doppler imaging with normal direction of blood flow towards the liver. Other: None. IMPRESSION: Cholelithiasis with gallbladder wall thickening and positive sonographic Murphy sign suggesting acute cholecystitis. There is no pericholecystic fluid. Mildly increased liver echogenicity likely reflects steatosis. There are 2 relative hypoechoic areas in the right hepatic lobe that may reflect fatty spurring or lesions. Recommend nonemergent evaluation with MRI as an outpatient. These results were called by telephone at the time of interpretation on 01/14/2021 at 10:09 am to provider Deno Etienne , who verbally acknowledged these results. Electronically Signed   By: Macy Mis M.D.   On: 01/14/2021 10:13    Procedures Procedures   Medications Ordered in ED Medications  piperacillin-tazobactam (ZOSYN) IVPB 3.375 g (has no administration in time range)  sucralfate (CARAFATE) 1 GM/10ML suspension 1 g (1 g Oral Given 01/14/21 0546)  sodium chloride 0.9 % bolus 1,000 mL (0 mLs Intravenous Stopped 01/14/21 1035)  alum & mag hydroxide-simeth (MAALOX/MYLANTA) 200-200-20 MG/5ML suspension 30 mL (30 mLs Oral Given 01/14/21 0904)    And  lidocaine  (XYLOCAINE) 2 % viscous mouth solution 15 mL (15 mLs Oral Given 01/14/21 0904)  HYDROmorphone (DILAUDID) injection 1 mg (1 mg Intravenous Given 01/14/21 1037)    ED Course  I have reviewed the triage vital signs and the nursing notes.  Pertinent labs & imaging results that were available during my care of the patient were reviewed by me and considered in my medical decision making (see chart for details).    MDM Rules/Calculators/A&P                         Initial impression-presents with right upper quadrant pain.  She is alert, no acute stress vital signs reassuring.  Unclear etiology possible gallbladder abnormality versus kidney stone will obtain basic lab work-up, provide fluids, GI cocktail, ultrasound and reassess.  Work-up-CBC shows leukocytosis of 13.7, slightly elevated from yesterday, CMP is unremarkable, lipase is 30.  Limited ultrasound reveals cholelithiasis with wall thickening positive sonic Murphy sign suggestive of acute cholecystitis.  Probable scoliosis, recommend nonemergent liver evaluation with MRI as an outpatient.  Reassessment reassessed after GI cocktail states she is not feeling any better is actually feeling worse, she is still tender in her epigastric region as well as her right upper quadrant, will provide her with Dilaudid and continue to monitor.  Patient was asked about yesterday's abnormal UA, she states that she is currently on her menstrual cycle, she has no urinary symptoms, suspect this is the likely cause of her hematuria.   Patient was updated on findings from ultrasound, she is currently resting company, pain has subsided since Dilaudid.  We will consult general surgery for further recommendations.  Consult-spoke with Barkley Boards, PA-C of general surgery she will come down evaluate the patient, she will admit the patient and provide her with antibiotics.  Rule out- low suspicion for lower lobe pneumonia as lung sounds are clear bilaterally, will  defer imaging at this time.   Low suspicion for pancreatitis  as lipase is within normal limits.  Low suspicion for ruptured stomach ulcer as she has no peritoneal sign present on exam.   Low suspicion for complicated diverticulitis as she is nontoxic-appearing, vital signs reassuring, she does have slight leukocytosis but presentation more consistent with acute cholecystitis as pain is in her right upper quadrant.  Low suspicion for appendicitis as she has no right lower quadrant tenderness, vital signs reassuring.    Plan-admission for acute cholecystitis     Final Clinical Impression(s) / ED Diagnoses Final diagnoses:  Acute cholecystitis    Rx / DC Orders ED Discharge Orders     None        Marcello Fennel, PA-C 01/14/21 1109    Regan Lemming, MD 01/14/21 1335

## 2021-01-15 ENCOUNTER — Encounter (HOSPITAL_COMMUNITY): Payer: Self-pay | Admitting: Surgery

## 2021-01-15 LAB — CBC
HCT: 37 % (ref 36.0–46.0)
Hemoglobin: 12.6 g/dL (ref 12.0–15.0)
MCH: 27.8 pg (ref 26.0–34.0)
MCHC: 34.1 g/dL (ref 30.0–36.0)
MCV: 81.7 fL (ref 80.0–100.0)
Platelets: 286 10*3/uL (ref 150–400)
RBC: 4.53 MIL/uL (ref 3.87–5.11)
RDW: 12.5 % (ref 11.5–15.5)
WBC: 15.5 10*3/uL — ABNORMAL HIGH (ref 4.0–10.5)
nRBC: 0 % (ref 0.0–0.2)

## 2021-01-15 LAB — COMPREHENSIVE METABOLIC PANEL
ALT: 61 U/L — ABNORMAL HIGH (ref 0–44)
AST: 54 U/L — ABNORMAL HIGH (ref 15–41)
Albumin: 3.6 g/dL (ref 3.5–5.0)
Alkaline Phosphatase: 89 U/L (ref 38–126)
Anion gap: 6 (ref 5–15)
BUN: 8 mg/dL (ref 6–20)
CO2: 25 mmol/L (ref 22–32)
Calcium: 8.4 mg/dL — ABNORMAL LOW (ref 8.9–10.3)
Chloride: 107 mmol/L (ref 98–111)
Creatinine, Ser: 0.68 mg/dL (ref 0.44–1.00)
GFR, Estimated: >60 mL/min (ref >60)
Glucose, Bld: 108 mg/dL — ABNORMAL HIGH (ref 70–99)
Potassium: 3.6 mmol/L (ref 3.5–5.1)
Sodium: 138 mmol/L (ref 135–145)
Total Bilirubin: 0.7 mg/dL (ref 0.3–1.2)
Total Protein: 6.8 g/dL (ref 6.5–8.1)

## 2021-01-15 LAB — LIPASE, BLOOD: Lipase: 28 U/L (ref 11–51)

## 2021-01-15 MED ORDER — OXYCODONE HCL 5 MG PO TABS: 5.0000 mg | ORAL_TABLET | Freq: Four times a day (QID) | ORAL | 0 refills | Status: DC | PRN

## 2021-01-15 MED ORDER — IBUPROFEN 200 MG PO TABS: 400.0000 mg | ORAL_TABLET | Freq: Four times a day (QID) | ORAL | Status: DC | PRN

## 2021-01-15 MED ORDER — ACETAMINOPHEN 500 MG PO TABS
1000.0000 mg | ORAL_TABLET | Freq: Three times a day (TID) | ORAL | Status: DC | PRN
Start: 2021-01-15 — End: 2021-02-17

## 2021-01-15 NOTE — Progress Notes (Signed)
Transition of Care [facility]) Screening Note  Patient Details  Name: Shelby Cunningham Date of Birth: Oct 08, 1985  [facility]) CM/SW Contact:    Sherie Don, LCSW Phone Number: 01/15/2021, 8:59 AM  Transition of Care Department [facility]) has reviewed patient and no TOC needs have been identified at this time. We will continue to monitor patient advancement through interdisciplinary progression rounds. If new patient transition needs arise, please place a TOC consult.

## 2021-01-15 NOTE — Discharge Summary (Signed)
[facility] Discharge Summary   Patient ID: Shelby Cunningham MRN: [id number] DOB/AGE: 34/18/1987 34 y.o.  Admit date: 01/14/2021 Discharge date: 01/15/2021  Admitting Diagnosis: Acute Cholecystitis   Discharge Diagnosis S/P laparoscopic cholecystectomy   Consultants None   Imaging: US Abdomen Limited  Result Date: 01/14/2021 CLINICAL DATA:  Right upper quadrant pain EXAM: ULTRASOUND ABDOMEN LIMITED RIGHT UPPER QUADRANT COMPARISON:  None. FINDINGS: Gallbladder: 3.3 cm gallstone. Wall thickening measuring up to 6 mm. No pericholecystic fluid. Positive sonographic Murphy sign noted by sonographer. Common bile duct: Diameter: 5 mm, within normal limits Liver: Increased parenchymal echogenicity. There are 2 relatively hypoechoic areas in the right lobe measuring 2.9 and 3.7 cm. Portal vein is patent on color Doppler imaging with normal direction of blood flow towards the liver. Other: None. IMPRESSION: Cholelithiasis with gallbladder wall thickening and positive sonographic Murphy sign suggesting acute cholecystitis. There is no pericholecystic fluid. Mildly increased liver echogenicity likely reflects steatosis. There are 2 relative hypoechoic areas in the right hepatic lobe that may reflect fatty spurring or lesions. Recommend nonemergent evaluation with MRI as an outpatient. These results were called by telephone at the time of interpretation on 01/14/2021 at 10:09 am to provider Deno Etienne , who verbally acknowledged these results. Electronically Signed   By: Macy Mis M.D.   On: 01/14/2021 10:13   DG C-Arm 1-60 Min-No Report  Result Date: 01/14/2021 Fluoroscopy was utilized by the requesting physician.  No radiographic interpretation.    Procedures Dr. Michael Boston (01/14/21) - Single Site Laparoscopic Cholecystectomy   Hospital Course:  Patient is a 35 year old female who presented to [facility] with abdominal pain.  Workup showed acute cholecystitis.  Patient was  admitted and underwent procedure listed above.  Tolerated procedure well and was transferred to the floor.  Diet was advanced as tolerated.  On POD1, the patient was voiding well, tolerating diet, ambulating well, pain well controlled, vital signs stable, incision c/d/i and felt stable for discharge home.  Patient will follow up in our office in 3-4 weeks and knows to call with questions or concerns. She will call to confirm appointment date/time.    Physical Exam: General:  Alert, NAD, pleasant, comfortable Abd:  Soft, ND, mild tenderness, incision C/D/I  I or a member of my team have reviewed this patient in the Controlled Substance Database.   Allergies as of 01/15/2021   No Known Allergies      Medication List     TAKE these medications    acetaminophen 500 MG tablet Commonly known as: TYLENOL Take 2 tablets (1,000 mg total) by mouth every 8 (eight) hours as needed for mild pain, fever or headache.   calcium carbonate 500 MG chewable tablet Commonly known as: TUMS - dosed in mg elemental calcium Chew 1 tablet by mouth daily as needed for indigestion or heartburn.   famotidine 20 MG tablet Commonly known as: PEPCID Take 20 mg by mouth daily as needed for heartburn or indigestion.   famotidine 20 MG tablet Commonly known as: PEPCID Take 1 tablet (20 mg total) by mouth 2 (two) times daily.   ibuprofen 200 MG tablet Commonly known as: Motrin IB Take 2 tablets (400 mg total) by mouth every 6 (six) hours as needed for moderate pain.   ondansetron 4 MG disintegrating tablet Commonly known as: ZOFRAN-ODT 4mg  ODT q6 hours prn nausea/vomit   oxyCODONE 5 MG immediate release tablet Commonly known as: Oxy IR/ROXICODONE Take 1 tablet (5 mg total) by mouth every 6 (six)  hours as needed for moderate pain or severe pain.          Follow-up Information     [facility]. Go on 02/11/2021.   Specialty: General Surgery Why: 1:30 PM. Please arrive 30 min early to  check in and have ID and insurance card with you. Contact information: [address] [place] [address] [phone]                 Signed: Norm Parcel , [facility] 01/15/2021, 8:22 AM Please see Amion for pager number during day hours 7:00am-4:30pm

## 2021-01-15 NOTE — Progress Notes (Signed)
D/C instructions given to patient. Patient has no questions. NT will wheel patient out once she is dressed

## 2021-01-21 LAB — SURGICAL PATHOLOGY

## 2021-01-21 NOTE — Progress Notes (Signed)
GI Tumor Board patient referral:   Shelby Cunningham  11-12-1985 [id number]  CARE TEAM: Patient Care Team: Patient, No Pcp Per (Inactive) as PCP - General (General Practice)  Diagnosis: GALLBLADDER CANCER  MD Care Team:  Michael Boston, MD - surgery  Focus of discussion: Radiology & Pathology reviews - comprehensive   Please send to GI Tumor Coordinator Ihor Gully)  in [place] message and attach the medical record to it

## 2021-01-27 ENCOUNTER — Other Ambulatory Visit: Payer: Self-pay

## 2021-01-27 ENCOUNTER — Encounter: Payer: Self-pay | Admitting: Surgery

## 2021-01-27 NOTE — Progress Notes (Signed)
The proposed treatment discussed in conference is for discussion purpose only and is not a binding recommendation.  The patients have not been physically examined, or presented with their treatment options.  Therefore, final treatment plans cannot be decided.  

## 2021-02-05 ENCOUNTER — Other Ambulatory Visit (HOSPITAL_COMMUNITY): Payer: Self-pay | Admitting: Surgery

## 2021-02-05 ENCOUNTER — Other Ambulatory Visit: Payer: Self-pay | Admitting: Surgery

## 2021-02-05 DIAGNOSIS — C23 Malignant neoplasm of gallbladder: Secondary | ICD-10-CM

## 2021-02-05 NOTE — Progress Notes (Signed)
I spoke with Ms Morello and she has agreed to consultation appt on 02/17/2021 at 1100.  I asked that she arrive at 1045 for registration.  She is aware of our location and valet service.  All questions were answered.  She verbalized understanding.

## 2021-02-08 ENCOUNTER — Ambulatory Visit (HOSPITAL_COMMUNITY)
Admission: RE | Admit: 2021-02-08 | Discharge: 2021-02-08 | Disposition: A | Discharge status: Home / Self Care | Payer: Commercial Managed Care - HMO | Source: Ambulatory Visit | Attending: Surgery | Admitting: Surgery

## 2021-02-08 ENCOUNTER — Other Ambulatory Visit: Payer: Self-pay

## 2021-02-08 DIAGNOSIS — C23 Malignant neoplasm of gallbladder: Secondary | ICD-10-CM | POA: Diagnosis present

## 2021-02-08 MED ORDER — IOHEXOL 300 MG/ML  SOLN
100.0000 mL | Freq: Once | INTRAMUSCULAR | Status: AC | PRN
  Administered 2021-02-08: 100 mL via INTRAVENOUS

## 2021-02-09 ENCOUNTER — Other Ambulatory Visit (HOSPITAL_COMMUNITY): Payer: Self-pay | Admitting: Surgery

## 2021-02-09 ENCOUNTER — Ambulatory Visit (HOSPITAL_COMMUNITY)
Admission: RE | Admit: 2021-02-09 | Discharge: 2021-02-09 | Disposition: A | Discharge status: Home / Self Care | Payer: Commercial Managed Care - HMO | Source: Ambulatory Visit | Attending: Surgery | Admitting: Surgery

## 2021-02-09 ENCOUNTER — Ambulatory Visit: Payer: Self-pay | Admitting: Surgery

## 2021-02-09 DIAGNOSIS — C23 Malignant neoplasm of gallbladder: Secondary | ICD-10-CM | POA: Diagnosis present

## 2021-02-09 MED ORDER — GADOBUTROL 1 MMOL/ML IV SOLN
9.0000 mL | Freq: Once | INTRAVENOUS | Status: AC | PRN
  Administered 2021-02-09: 07:00:00 9 mL via INTRAVENOUS

## 2021-02-09 NOTE — H&P (Signed)
History of Present Illness: Shelby Cunningham is a 36 y.o. female who is seen today for for evaluation of gallbladder cancer. She underwent a laparoscopic cholecystectomy on 01/14/21 by Dr. Johney Maine after presenting with acute cholecystitis. Her postoperative course was unremarkable and she was discharged home on POD1. 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  She underwent staging scans which did not show any evidence of metastatic disease, and has been referred to me to discuss oncologic resection.   Since her surgery she has overall been feeling well.  She has been eating without difficulty and has not had any unintentional weight loss.  She occasionally has some sharp pains around her incision.  She reports that her sister was diagnosed with ovarian cancer at age 84 and underwent genetic testing that was reportedly negative.  There are no other family members with a history of cancer that she knows of.  The patient has not had any prior abdominal surgeries other than her cholecystectomy.   Surgical Pathology: A. GALLBLADDER, CHOLECYSTECTOMY:  - Invasive well to moderately differentiated adenocarcinoma, 1.6 cm,  arising in association with a gallbladder adenoma with high-grade  dysplasia  - Carcinoma invades beyond the muscularis propria but not into the  serosal surface  - Resection margins are negative for carcinoma  - See oncology table  - See comment      COMMENT:    About 10% of the tumor shows presence of a neuroendocrine component in  the form of a high-grade neuroendocrine carcinoma.  Immunostains for  synaptophysin, CD56, chromogranin and Ki-67 are confirmatory.  Dr. Johney Maine  was notified on 01/21/2021.    ONCOLOGY TABLE:    GALLBLADDER, CARCINOMA: Resection    Procedure: Cholecystectomy  Tumor Site: Body  Tumor Size: 1.6 cm  Histologic Type: Adenocarcinoma  Histologic Grade: G2: Moderately  differentiated  Tumor Extension: Tumor invades perimuscular connective tissue on the  peritoneal side without serosal involvement  Margins:       Margin Status for Invasive Carcinoma: All margins negative for  invasive carcinoma       Margin Status for Intraepithelial Neoplasia: All margins negative  for intraepithelial neoplasia  Regional Lymph Nodes:       Number of Lymph Nodes with Tumor: 0       Number of Lymph Nodes Examined: 2  Distant Metastasis:       Distant Site(s) Involved: Not applicable  Pathologic Stage Classification (pTNM, AJCC 8th Edition): pT2a, pN0  Ancillary Studies: Can be performed upon request  Representative Tumor Block: A4    Review of Systems: A complete review of systems was obtained from the patient.  I have reviewed this information and discussed as appropriate with the patient.  See HPI as well for other ROS.       Medical History: Past Medical History Past Medical History: Diagnosis Date  Aneurysm (CMS-HCC)    Anxiety    Brain aneurysm    History of cancer    TIA (transient ischemic attack)        Patient Active Problem List Diagnosis  Gallbladder cancer (CMS-HCC)     Past Surgical History Past Surgical History: Procedure Laterality Date  CHOLECYSTECTOMY          Allergies No Known Allergies    Current Outpatient Medications on File Prior to Visit Medication Sig Dispense Refill  [id number] fum-folic (PRENATAL VITAMIN-IRON-FA) 27 mg iron- 1 mg Tab Take by mouth.       No current  facility-administered medications on file prior to visit.     Family History Family History Problem Relation Age of Onset  High blood pressure (Hypertension) Mother    Diabetes Mother    Obesity Brother        Social History   Tobacco Use Smoking Status Never Smokeless Tobacco Never     Social History Social History    Socioeconomic History  Marital status: Married Tobacco Use  Smoking status: Never  Smokeless  tobacco: Never Substance and Sexual Activity  Alcohol use: Yes     Comment: maybe cup a month or less  Drug use: Never      Objective:     Vitals:   02/09/21 1058 BP: 132/70 Pulse: 102 Temp: 37.1 C (98.7 F) SpO2: 98% Weight: 87.1 kg (192 lb) Height: 167.6 cm ('5\' 6"' )   Body mass index is 30.99 kg/m.   Physical Exam Vitals reviewed.  Constitutional:      General: She is not in acute distress.    Appearance: Normal appearance.  HENT:     Head: Normocephalic and atraumatic.  Eyes:     General: No scleral icterus.    Conjunctiva/sclera: Conjunctivae normal.  Pulmonary:     Effort: Pulmonary effort is normal. No respiratory distress.  Abdominal:     General: There is no distension.     Tenderness: There is no abdominal tenderness.     Comments: Umbilical incision is healing well with no erythema, induration, or drainage.  Musculoskeletal:        General: No swelling or deformity. Normal range of motion.     Cervical back: Normal range of motion.  Skin:    General: Skin is warm and dry.  Neurological:     General: No focal deficit present.     Mental Status: She is alert and oriented to person, place, and time.  Psychiatric:        Mood and Affect: Mood normal.        Behavior: Behavior normal.        Thought Content: Thought content normal.            Labs, Imaging and Diagnostic Testing: MRI abdomen: IMPRESSION: 1. Expected postsurgical changes of cholecystectomy. Normal caliber common bile duct with no evidence choledocholithiasis. 2. Hepatic steatosis with areas of focal fatty sparing. No suspicious liver lesions.   CT chest: IMPRESSION: 9 mm right hilar lymph node is nonspecific and may be reactive. Recommend attention on follow-up studies. Otherwise, no evidence of metastatic disease in the chest.     Assessment and Plan:    Diagnoses and all orders for this visit:   Gallbladder cancer (CMS-HCC) -     Ambulatory Referral to Genetics    Family history of cancer -     Ambulatory Referral to Genetics       This is a 36 year old female with a T2a gallbladder cancer incidentally diagnosed on surgical pathology after cholecystectomy.  Margins were negative, however given that this is a T2 lesion, I discussed with the patient that an oncologic resection is recommended.  Personally reviewed her MRI, which does not show any liver lesions or other signs of metastatic disease within the abdomen.  I discussed the details of the surgery, which would include a staging laparoscopy to rule out the development of peritoneal carcinomatosis, followed by an open partial central hepatectomy and portal lymph node dissection.  I will also take a new cystic duct margin and if positive she would need common bile duct  resection with hepaticojejunostomy, however I think this is very unlikely.  I reviewed the risks of surgery including bleeding, infection, and bile leak.  She expressed understanding and agrees to proceed with surgery.  Given her personal and family history of cancer as a very young age, I referred her to genetics.  She is already scheduled to see Dr. Burr Medico on 2/1 to discuss adjuvant chemotherapy. She will be contacted to schedule a surgery date and was encouraged to call if she has any other questions or concerns.  Michaelle Birks, [facility] General, Hepatobiliary and Pancreatic Surgery 02/09/21 12:26 PM

## 2021-02-09 NOTE — H&P (View-Only) (Signed)
History of Present Illness: Shelby Cunningham is a 35 y.o. female who is seen today for for evaluation of gallbladder cancer. She underwent a laparoscopic cholecystectomy on 01/14/21 by Dr. Johney Maine after presenting with acute cholecystitis. Her postoperative course was unremarkable and she was discharged home on POD1. 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  She underwent staging scans which did not show any evidence of metastatic disease, and has been referred to me to discuss oncologic resection.   Since her surgery she has overall been feeling well.  She has been eating without difficulty and has not had any unintentional weight loss.  She occasionally has some sharp pains around her incision.  She reports that her sister was diagnosed with ovarian cancer at age 79 and underwent genetic testing that was reportedly negative.  There are no other family members with a history of cancer that she knows of.  The patient has not had any prior abdominal surgeries other than her cholecystectomy.   Surgical Pathology: A. GALLBLADDER, CHOLECYSTECTOMY:  - Invasive well to moderately differentiated adenocarcinoma, 1.6 cm,  arising in association with a gallbladder adenoma with high-grade  dysplasia  - Carcinoma invades beyond the muscularis propria but not into the  serosal surface  - Resection margins are negative for carcinoma  - See oncology table  - See comment      COMMENT:    About 10% of the tumor shows presence of a neuroendocrine component in  the form of a high-grade neuroendocrine carcinoma.  Immunostains for  synaptophysin, CD56, chromogranin and Ki-67 are confirmatory.  Dr. Johney Maine  was notified on 01/21/2021.    ONCOLOGY TABLE:    GALLBLADDER, CARCINOMA: Resection    Procedure: Cholecystectomy  Tumor Site: Body  Tumor Size: 1.6 cm  Histologic Type: Adenocarcinoma  Histologic Grade: G2: Moderately  differentiated  Tumor Extension: Tumor invades perimuscular connective tissue on the  peritoneal side without serosal involvement  Margins:       Margin Status for Invasive Carcinoma: All margins negative for  invasive carcinoma       Margin Status for Intraepithelial Neoplasia: All margins negative  for intraepithelial neoplasia  Regional Lymph Nodes:       Number of Lymph Nodes with Tumor: 0       Number of Lymph Nodes Examined: 2  Distant Metastasis:       Distant Site(s) Involved: Not applicable  Pathologic Stage Classification (pTNM, AJCC 8th Edition): pT2a, pN0  Ancillary Studies: Can be performed upon request  Representative Tumor Block: A4    Review of Systems: A complete review of systems was obtained from the patient.  I have reviewed this information and discussed as appropriate with the patient.  See HPI as well for other ROS.       Medical History: Past Medical History Past Medical History: Diagnosis Date  Aneurysm (CMS-HCC)    Anxiety    Brain aneurysm    History of cancer    TIA (transient ischemic attack)        Patient Active Problem List Diagnosis  Gallbladder cancer (CMS-HCC)     Past Surgical History Past Surgical History: Procedure Laterality Date  CHOLECYSTECTOMY          Allergies No Known Allergies    Current Outpatient Medications on File Prior to Visit Medication Sig Dispense Refill  [id number] fum-folic (PRENATAL VITAMIN-IRON-FA) 27 mg iron- 1 mg Tab Take by mouth.       No current  facility-administered medications on file prior to visit.     Family History Family History Problem Relation Age of Onset  High blood pressure (Hypertension) Mother    Diabetes Mother    Obesity Brother        Social History   Tobacco Use Smoking Status Never Smokeless Tobacco Never     Social History Social History    Socioeconomic History  Marital status: Married Tobacco Use  Smoking status: Never  Smokeless  tobacco: Never Substance and Sexual Activity  Alcohol use: Yes     Comment: maybe cup a month or less  Drug use: Never      Objective:     Vitals:   02/09/21 1058 BP: 132/70 Pulse: 102 Temp: 37.1 C (98.7 F) SpO2: 98% Weight: 87.1 kg (192 lb) Height: 167.6 cm ('5\' 6"' )   Body mass index is 30.99 kg/m.   Physical Exam Vitals reviewed.  Constitutional:      General: She is not in acute distress.    Appearance: Normal appearance.  HENT:     Head: Normocephalic and atraumatic.  Eyes:     General: No scleral icterus.    Conjunctiva/sclera: Conjunctivae normal.  Pulmonary:     Effort: Pulmonary effort is normal. No respiratory distress.  Abdominal:     General: There is no distension.     Tenderness: There is no abdominal tenderness.     Comments: Umbilical incision is healing well with no erythema, induration, or drainage.  Musculoskeletal:        General: No swelling or deformity. Normal range of motion.     Cervical back: Normal range of motion.  Skin:    General: Skin is warm and dry.  Neurological:     General: No focal deficit present.     Mental Status: She is alert and oriented to person, place, and time.  Psychiatric:        Mood and Affect: Mood normal.        Behavior: Behavior normal.        Thought Content: Thought content normal.            Labs, Imaging and Diagnostic Testing: MRI abdomen: IMPRESSION: 1. Expected postsurgical changes of cholecystectomy. Normal caliber common bile duct with no evidence choledocholithiasis. 2. Hepatic steatosis with areas of focal fatty sparing. No suspicious liver lesions.   CT chest: IMPRESSION: 9 mm right hilar lymph node is nonspecific and may be reactive. Recommend attention on follow-up studies. Otherwise, no evidence of metastatic disease in the chest.     Assessment and Plan:    Diagnoses and all orders for this visit:   Gallbladder cancer (CMS-HCC) -     Ambulatory Referral to Genetics    Family history of cancer -     Ambulatory Referral to Genetics       This is a 36 year old female with a T2a gallbladder cancer incidentally diagnosed on surgical pathology after cholecystectomy.  Margins were negative, however given that this is a T2 lesion, I discussed with the patient that an oncologic resection is recommended.  Personally reviewed her MRI, which does not show any liver lesions or other signs of metastatic disease within the abdomen.  I discussed the details of the surgery, which would include a staging laparoscopy to rule out the development of peritoneal carcinomatosis, followed by an open partial central hepatectomy and portal lymph node dissection.  I will also take a new cystic duct margin and if positive she would need common bile duct  resection with hepaticojejunostomy, however I think this is very unlikely.  I reviewed the risks of surgery including bleeding, infection, and bile leak.  She expressed understanding and agrees to proceed with surgery.  Given her personal and family history of cancer as a very young age, I referred her to genetics.  She is already scheduled to see Dr. Burr Medico on 2/1 to discuss adjuvant chemotherapy. She will be contacted to schedule a surgery date and was encouraged to call if she has any other questions or concerns.  Michaelle Birks, [facility], Hepatobiliary and Pancreatic Surgery 02/09/21 12:26 PM

## 2021-02-17 ENCOUNTER — Encounter: Payer: Self-pay | Admitting: Nurse Practitioner

## 2021-02-17 ENCOUNTER — Other Ambulatory Visit: Payer: Self-pay

## 2021-02-17 ENCOUNTER — Inpatient Hospital Stay: Payer: Managed Care, Other (non HMO) | Attending: Nurse Practitioner | Admitting: Nurse Practitioner

## 2021-02-17 DIAGNOSIS — Z8041 Family history of malignant neoplasm of ovary: Secondary | ICD-10-CM | POA: Insufficient documentation

## 2021-02-17 DIAGNOSIS — C23 Malignant neoplasm of gallbladder: Secondary | ICD-10-CM | POA: Insufficient documentation

## 2021-02-17 DIAGNOSIS — G893 Neoplasm related pain (acute) (chronic): Secondary | ICD-10-CM | POA: Insufficient documentation

## 2021-02-17 DIAGNOSIS — Z7189 Other specified counseling: Secondary | ICD-10-CM | POA: Diagnosis not present

## 2021-02-17 MED ORDER — CAPECITABINE 500 MG PO TABS
1000.0000 mg/m2 | ORAL_TABLET | Freq: Two times a day (BID) | ORAL | 0 refills | Status: DC
  Filled 2021-02-17: qty 112, 14d supply, fill #0
  Filled 2021-03-10: qty 112, 21d supply, fill #0
  Filled 2021-03-10: qty 112, 14d supply, fill #0

## 2021-02-17 NOTE — Progress Notes (Signed)
I explained the services provided at [facility] and provided written information.  I met with Ms Shelby Cunningham after her consultation with Cira Rue, NP and  Dr Burr Medico.  I explained my role as a nurse navigator and provided my contact information.  All questions were answered.  She verbalized understanding.

## 2021-02-17 NOTE — Progress Notes (Addendum)
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 There is no pericholecystic fluid.   Mildly increased liver echogenicity likely reflects steatosis. There are 2 relative hypoechoic areas in the right hepatic lobe that may reflect fatty spurring or lesions. Recommend nonemergent evaluation with MRI   01/14/2021 Surgery   Lap cholecystectomy by Dr. Michael Boston    01/14/2021 Pathology Results   A. GALLBLADDER, CHOLECYSTECTOMY:  - Invasive well to moderately differentiated adenocarcinoma, 1.6 cm,  arising in association with a gallbladder adenoma with high-grade dysplasia  - Carcinoma invades beyond the muscularis propria but not into the serosal surface  - Resection margins are negative for carcinoma  - See oncology table   COMMENT:  About 10% of the tumor shows presence of a neuroendocrine component in the form of a high-grade neuroendocrine carcinoma.  Immunostains for synaptophysin, CD56, chromogranin and Ki-67 are confirmatory.    02/08/2021 Imaging   CT Chest  IMPRESSION: 9 mm right hilar lymph node is nonspecific and may be reactive. Recommend attention on follow-up studies. Otherwise, no evidence of metastatic disease in the chest.   02/09/2021 Imaging   MRI ABD IMPRESSION: 1. Expected postsurgical changes of cholecystectomy. Normal caliber common bile duct with no evidence choledocholithiasis. 2. Hepatic steatosis with areas of focal fatty sparing. No suspicious liver lesions. 3. Small cystic lesion of the tail of the pancreas measuring 4 mm. Recommend follow up pre and post contrast MRI/MRCP or pancreatic protocol CT in 1 year. This recommendation follows ACR consensus guidelines: Management of Incidental Pancreatic Cysts:    02/17/2021 Initial Diagnosis   Adenocarcinoma of gallbladder ([place])      HISTORY OF PRESENTING ILLNESS:  Shelby Cunningham 36 y.o. female with no significant PMH except cerebral aneurysm is here because of gallbladder cancer. She presented to ED 01/13/21 with acute onset epigastric pain, n/v, and chills. Labs were unremarkable except mild leukocytosis up to WBC 13.7. ABD [place] 01/14/21 showed cholelithiasis with gallbladder wall thickening and positive murphy sign suggesting acute cholecystitis. There was mildly increased liver echogenicity with 2 hypoechoic areas in the right lobe, possibly reflecting fatty liver deposits. She was taken to the OR 01/14/21 by Dr. 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 The liver was normal appearing. Surgical path of the gallbladder showed invasive well to moderately differentiated adenocarcinoma measuring 1.6 cm arising from an adenoma with high grade dysplasia. The carcinoma invades beyond the muscularis propria but not the serosal surface. Margins are negative. 2 Lymph nodes were examined and negative. This was staged pT2aN0. 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 Stains are positive for  synaptophysic, CD56, chromogranin, and Ki-67. Postoperative  course was uneventful and she was discharged home POD 1 on 01/15/21. She underwent staging CT chest 02/08/21 which showed nonspecific 9 mm right hilar LN otherwise negative for metastatic disease. Dedicated abdominal MRI 02/09/21 showed hepatic steatosis with areas of focal fatty sparing and an incidental small cystic lesion in the pancreatic tail measuring 4 mm. The recommendation is for follow up MRI/MRCP in 1 year. Seen by Dr. Michaelle Birks and scheduled for oncologic resection 02/26/21.   Socially, she is married with one 36-year-old son who goes to daycare.  Initially from [place] has lived here close to 15 years.  She is a stay-at-home mom, independent with ADLs and drives herself.  Drinks alcohol rarely, once per month, denies tobacco or other drug use.  Has not begun mammogram or colonoscopy screenings.  Family history is significant for sister with ovarian cancer status post surgery and being monitored.  Patient is scheduled with genetics already.  Today, she presents by herself.  She feels she has recovered very well from surgery.  She still has intermittent pain she thinks would improve "if I slowed down some."  She is active with her child and walks on the treadmill.  Eating and drinking well.  She got a URI from her son last week but has recovered.  Denies fever, chills, chest pain, dyspnea, n/v, or other specific complaints.    MEDICAL HISTORY:  Past Medical History:  Diagnosis Date   Aneurysm ([place])    Brain - has checked every 6 months   Exercise-induced shortness of breath    "sometimes at the gym" (01/30/2012)   No pertinent past medical history    Status post vacuum-assisted vaginal delivery 11/29/2016   TIA (transient ischemic attack) 01/14    SURGICAL HISTORY: Past Surgical History:  Procedure Laterality Date   LAPAROSCOPIC CHOLECYSTECTOMY SINGLE SITE WITH INTRAOPERATIVE CHOLANGIOGRAM N/A 01/14/2021   Procedure:  LAPAROSCOPIC CHOLECYSTECTOMY SINGLE SITE;  Surgeon: Michael Boston, MD;  Location: [facility] ORS;  Service: General;  Laterality: N/A;   TEE WITHOUT CARDIOVERSION  01/31/2012   Procedure: TRANSESOPHAGEAL ECHOCARDIOGRAM (TEE);  Surgeon: Lelon Perla, MD;  Location: [facility] ENDOSCOPY;  Service: Cardiovascular;  Laterality: N/A;    SOCIAL HISTORY: Social History   Socioeconomic History   Marital status: Married    Spouse name: Not on file   Number of children: Not on file   Years of education: Not on file   Highest education level: Not on file  Occupational History   Not on file  Tobacco Use   Smoking status: Never   Smokeless tobacco: Never  Vaping Use   Vaping Use: Never used  Substance and Sexual Activity   Alcohol use: No    Alcohol/week: 1.0 standard drink    Types: 1 Shots of liquor per week    Comment: 01/30/2012 "3 drinks a month"   Drug use: No   Sexual activity: Yes    Birth control/protection: Condom  Other Topics Concern   Not on file  Social History Narrative   Not on file   Social Determinants of Health   Financial Resource Strain: Not on file  Food Insecurity: Not on file  Transportation Needs: Not on file  Physical Activity: Not on file  Stress: Not on file  Social Connections: Not on file  Intimate Partner Violence: Not on file    FAMILY HISTORY: Family History  Problem Relation Age of Onset   Diabetes Mother    Diabetes Paternal Uncle     ALLERGIES:  has No Known  Allergies.  MEDICATIONS:  No current outpatient medications on file.   No current facility-administered medications for this visit.    REVIEW OF SYSTEMS:   Constitutional: Denies fevers, chills or abnormal night sweats Eyes: Denies blurriness of vision, double vision or watery eyes Ears, nose, mouth, throat, and face: Denies mucositis or sore throat Respiratory: Denies cough, dyspnea or wheezes Cardiovascular: Denies palpitation, chest discomfort or lower extremity  swelling Gastrointestinal:  Denies nausea, vomiting, constipation, diarrhea, heartburn or change in bowel habits (+) intermittent surgical pain Skin: Denies abnormal skin rashes Lymphatics: Denies new lymphadenopathy or easy bruising Neurological:Denies numbness, tingling or new weaknesses (+) brain aneurysm  Behavioral/Psych: Mood is stable, no new changes  All other systems were reviewed with the patient and are negative.  PHYSICAL EXAMINATION: ECOG PERFORMANCE STATUS: 1 - Symptomatic but completely ambulatory  Vitals:   02/17/21 1124  BP: 130/82  Pulse: 91  Resp: 18  Temp: 98.6 F (37 C)  SpO2: 96%   Filed Weights   02/17/21 1124  Weight: 195 lb 1 oz (88.5 kg)    GENERAL:alert, no distress and comfortable SKIN: no rash  EYES:  sclera clear NECK: without mass LUNGS: clear with normal breathing effort HEART: regular rate & rhythm, no lower extremity edema ABDOMEN: abdomen soft, non-tender and normal bowel sounds. Surgical incisions healed  Musculoskeletal:no cyanosis of digits and no clubbing  PSYCH: alert & oriented x 3 with fluent speech NEURO: no focal motor/sensory deficits  LABORATORY DATA:  I have reviewed the data as listed CBC Latest Ref Rng & Units 01/15/2021 01/14/2021 01/13/2021  WBC 4.0 - 10.5 K/uL 15.5(H) 13.7(H) 13.2(H)  Hemoglobin 12.0 - 15.0 g/dL 12.6 14.5 14.8  Hematocrit 36.0 - 46.0 % 37.0 44.2 44.8  Platelets 150 - 400 K/uL 286 310 330   CMP Latest Ref Rng & Units 01/15/2021 01/14/2021 01/13/2021  Glucose 70 - 99 mg/dL 108(H) 107(H) 106(H)  BUN 6 - 20 mg/dL '8 10 11  ' Creatinine 0.44 - 1.00 mg/dL 0.68 0.76 0.67  Sodium 135 - 145 mmol/L 138 139 137  Potassium 3.5 - 5.1 mmol/L 3.6 4.2 3.5  Chloride 98 - 111 mmol/L 107 105 104  CO2 22 - 32 mmol/L '25 25 25  ' Calcium 8.9 - 10.3 mg/dL 8.4(L) 9.2 9.3  Total Protein 6.5 - 8.1 g/dL 6.8 8.0 8.4(H)  Total Bilirubin 0.3 - 1.2 mg/dL 0.7 0.6 0.6  Alkaline Phos 38 - 126 U/L 89 108 117  AST 15 - 41 U/L 54(H)  21 24  ALT 0 - 44 U/L 61(H) 26 30      RADIOGRAPHIC STUDIES: I have personally reviewed the radiological images as listed and agreed with the findings in the report. CT CHEST W CONTRAST  Result Date: 02/08/2021 CLINICAL DATA:  Gallbladder malignancy EXAM: CT CHEST WITH CONTRAST TECHNIQUE: Multidetector CT imaging of the chest was performed during intravenous contrast administration. RADIATION DOSE REDUCTION: This exam was performed according to the departmental dose-optimization program which includes automated exposure control, adjustment of the mA and/or kV according to patient size and/or use of iterative reconstruction technique. CONTRAST:  161m OMNIPAQUE IOHEXOL 300 MG/ML  SOLN COMPARISON:  None. FINDINGS: Cardiovascular: Heart is not enlarged. There is no pericardial effusion. The vasculature is unremarkable. Mediastinum/Nodes: The thyroid is unremarkable. The esophagus is grossly unremarkable. There is a 9 mm right hilar lymph node, nonspecific and not pathologically enlarged by size criteria. Otherwise, there is no mediastinal, hilar, or axillary lymphadenopathy. Lungs/Pleura: The trachea and central airways are patent. The lungs  are clear, with no focal consolidation or pulmonary edema. There is no pleural effusion or pneumothorax. There is no evidence of metastatic disease in the lungs. Upper Abdomen: The patient is status post cholecystectomy. Otherwise, the imaged portions of the upper abdominal viscera are unremarkable. Musculoskeletal: There is no acute osseous abnormality or aggressive osseous lesion. IMPRESSION: 9 mm right hilar lymph node is nonspecific and may be reactive. Recommend attention on follow-up studies. Otherwise, no evidence of metastatic disease in the chest. Electronically Signed   By: Valetta Mole M.D.   On: 02/08/2021 15:17   MR ABDOMEN WWO CONTRAST  Addendum Date: 02/09/2021   ADDENDUM REPORT: 02/09/2021 16:49 ADDENDUM: Corrected report: Additional impression point  needed, corrected as follows: Small cystic lesion of the tail of the pancreas measuring 4 mm. Recommend follow up pre and post contrast MRI/MRCP or pancreatic protocol CT in 1 year. This recommendation follows ACR consensus guidelines: Management of Incidental Pancreatic Cysts: A White Paper of the ACR Incidental Findings Committee. [place] [id number]. These results will be called to the ordering clinician or representative by the Radiologist Assistant, and communication documented in the PACS or [employer]. Electronically Signed   By: Yetta Glassman M.D.   On: 02/09/2021 16:49   Result Date: 02/09/2021 CLINICAL DATA:  Gallbladder cancer EXAM: MRI ABDOMEN WITHOUT AND WITH CONTRAST TECHNIQUE: Multiplanar multisequence MR imaging of the abdomen was performed both before and after the administration of intravenous contrast. CONTRAST:  46m GADAVIST GADOBUTROL 1 MMOL/ML IV SOLN COMPARISON:  Ultrasound abdomen dated December 25, 2020 FINDINGS: Lower chest: No acute findings. Hepatobiliary: Large geographic area of signal dropout on out of phase imaging compatible with hepatic steatosis. Postsurgical changes of cholecystectomy. Common bile duct is normal in caliber measuring up to 4 mm with no evidence of choledocholithiasis. Low insertion of the cystic duct. Pancreas: Small T2 hyperintense lesion of the pancreatic tail measuring 4 mm on series 29, image 16. No peripancreatic inflammation or inflammatory change. Spleen:  Within normal limits in size and appearance. Adrenals/Urinary Tract: Bilateral adrenal glands are unremarkable. Kidneys enhance symmetrically with no evidence hydronephrosis. Stomach/Bowel: Visualized portions within the abdomen are unremarkable. Vascular/Lymphatic: No pathologically enlarged lymph nodes identified. No abdominal aortic aneurysm demonstrated. Other:  None. Musculoskeletal: No suspicious bone lesions identified. IMPRESSION: 1. Expected postsurgical changes of  cholecystectomy. Normal caliber common bile duct with no evidence choledocholithiasis. 2. Hepatic steatosis with areas of focal fatty sparing. No suspicious liver lesions. Electronically Signed: By: LYetta GlassmanM.D. On: 02/09/2021 08:11   MR 3D Recon At Scanner  Addendum Date: 02/09/2021   ADDENDUM REPORT: 02/09/2021 16:49 ADDENDUM: Corrected report: Additional impression point needed, corrected as follows: Small cystic lesion of the tail of the pancreas measuring 4 mm. Recommend follow up pre and post contrast MRI/MRCP or pancreatic protocol CT in 1 year. This recommendation follows ACR consensus guidelines: Management of Incidental Pancreatic Cysts: A White Paper of the ACR Incidental Findings Committee. [id number] These results will be called to the ordering clinician or representative by the Radiologist Assistant, and communication documented in the PACS or [employer] Electronically Signed   By: LYetta GlassmanM.D.   On: 02/09/2021 16:49   Result Date: 02/09/2021 CLINICAL DATA:  Gallbladder cancer EXAM: MRI ABDOMEN WITHOUT AND WITH CONTRAST TECHNIQUE: Multiplanar multisequence MR imaging of the abdomen was performed both before and after the administration of intravenous contrast. CONTRAST:  936mGADAVIST GADOBUTROL 1 MMOL/ML IV SOLN COMPARISON:  Ultrasound abdomen dated December 25, 2020 FINDINGS: Lower chest: No acute findings. Hepatobiliary: Large geographic area of signal dropout on out of phase imaging compatible with hepatic steatosis. Postsurgical changes of cholecystectomy. Common bile duct is normal in caliber measuring up to 4 mm with no evidence of choledocholithiasis. Low insertion of the cystic duct. Pancreas: Small T2 hyperintense lesion of the pancreatic tail measuring 4 mm on series 29, image 16. No peripancreatic inflammation or inflammatory change. Spleen:  Within normal limits in size and appearance. Adrenals/Urinary Tract: Bilateral adrenal glands are  unremarkable. Kidneys enhance symmetrically with no evidence hydronephrosis. Stomach/Bowel: Visualized portions within the abdomen are unremarkable. Vascular/Lymphatic: No pathologically enlarged lymph nodes identified. No abdominal aortic aneurysm demonstrated. Other:  None. Musculoskeletal: No suspicious bone lesions identified. IMPRESSION: 1. Expected postsurgical changes of cholecystectomy. Normal caliber common bile duct with no evidence choledocholithiasis. 2. Hepatic steatosis with areas of focal fatty sparing. No suspicious liver lesions. Electronically Signed: By: Yetta Glassman M.D. 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 She presented with acute abdominal pain, n/v, and chills. Work up showed cholecystitis. -She had incidental finding of adenocarcinoma on cholecystectomy, margins and LNs clear. Due to incidental finding, full oncologic resection was not done.  -We reviewed the aggressive nature of gall bladder cancer. We discussed 10% of cells on her path report showed neuroendocrine component, which behaves more aggressively like small cell cancers -her case was reviewed in our multidisciplinary conference.  -She is scheduled for oncologic resection on 02/27/20 with Dr. Michaelle Birks -To reduce her recurrence risk, we recommend adjuvant Xeloda BID days 1-14 q21 days x6 months.  -If final surgical path shows more neuroendocrine cells, we may recommend more intensive small cell regimen.  --Chemotherapy consent: Potential side effects including but not limited to fatigue, decreased appetite, mucositis, nausea, vomiting, diarrhea, hand-foot syndrome, rash, neuropathy, fluid retention, renal and kidney dysfunction, neutropenic fever, need for blood transfusion, bleeding, were discussed with patient in great detail. She agrees to  proceed. The goal of adjuvant chemotherapy is curative.  -Shelby Cunningham appears well. She has recovered from surgery, pain is minimal. She is otherwise young, healthy with good PS and support network, she is an ideal candidate for adjuvant chemo.  -She will return in 4 weeks to review final path results, stage, and finalize her treatment plan/start date.   Abdominal pain, n/v -Secondary to #1 -resolved after surgery -has mild residual surgical pain, has recovered well from surgery. Very active at home with young son  Brain aneurysm, 2012 -followed by neuro, needs to re-establish care   Social -married with 1 son, in day care. Spouse works outside of home -patient is independent with ADLs, drives  -I reviewed available resources including SW, chaplain, financial, transportation, if needed  5. Genetics  -Sister had ovarian cancer, s/p surgery no adjuvant treatment doing well on surveillance.  -per pt her sister's genetic testing was negative. Pt is scheduled 02/24/21  6. Goals of care -The goal of adjuvant chemo is curative, to reduce recurrence risk  PLAN: -Work up reviewed -Proceed with oncologic resection 02/26/21 by Dr. Michaelle Birks -F/up in 4 weeks, to review surgical path and finalize treatment plan -Adjuvant Xeloda after recovery, will prescribe. She knows not to start until post op f/up with [place] -Genetics 02/24/21 -Patient seen with Dr. Burr Medico    Orders Placed This Encounter  Procedures  CBC with Differential (Cancer Center Only)    Standing Status:   Standing    Number of Occurrences:   50    Standing Expiration Date:   02/17/2022   CMP ([place] only)    Standing Status:   Standing    Number of Occurrences:   50    Standing Expiration Date:   02/17/2022   [place] 19.9    Standing Status:   Standing    Number of Occurrences:   20    Standing Expiration Date:   02/17/2022    All questions were answered. The patient knows to call the clinic with any problems, questions or  concerns.      Alla Feeling, NP 02/17/2021   Addendum  I have seen the patient, examined her. I agree with the assessment and and plan and have edited the notes.   36 year old female without significant past medical history, presented with acute abdominal pain.  She underwent cholecystectomy, unfortunately showed gallbladder adenocarcinoma pT1bN0 with 10% high-grade neuroendocrine tumor.  I reviewed her surgical pathology and staging CT scan findings with her, and recommend adjuvant Xeloda for 6 months, based on the BILCAP trial and NCCN guideline.  She is scheduled to undergo staging laparoscope oncologic resection 02/26/21 by Dr. Michaelle Birks. We will see her back 2 weeks after surgery to determine the timing of adjuvant xeloda. I will call in to get it ready for her. All questions were answered.  Truitt Merle  02/17/2021

## 2021-02-18 ENCOUNTER — Other Ambulatory Visit (HOSPITAL_COMMUNITY): Payer: Self-pay

## 2021-02-18 ENCOUNTER — Telehealth: Payer: Self-pay | Admitting: Pharmacist

## 2021-02-18 ENCOUNTER — Telehealth: Payer: Self-pay

## 2021-02-18 NOTE — Telephone Encounter (Signed)
Oral Oncology Patient Advocate Encounter  Prior Authorization for Xeloda has been approved.    PA# [id number] Effective dates: 02/18/21 through 02/18/22  Patients co-pay is $288  Oral Oncology Clinic will continue to follow.   [place] Patient [place] Phone [phone] Fax [phone] 02/18/2021 9:52 AM

## 2021-02-18 NOTE — Telephone Encounter (Signed)
Oral Oncology Patient Advocate Encounter   Received notification from Christella Scheuermann that prior authorization for Xeloda is required.   PA submitted by phone [phone] [place] [id number] Status is pending   Oral Oncology Clinic will continue to follow.  [place] Patient [place] Phone [phone] Fax [phone] 02/18/2021 9:51 AM

## 2021-02-18 NOTE — Telephone Encounter (Addendum)
Oral Oncology Pharmacist Encounter  Received new prescription for Xeloda (capecitabine) for the adjuvant treatment of stage IIA gallbladder cancer, planned duration 6 months (patient has planned resection on 02/27/20 - Xeloda to be discussed further and start date decided in ~4 weeks per note.  CMP and CBC from 12/30/222 assessed, no baseline dose adjustments required. Noted pt LMP documented as 01/19/21. Last pregnancy test done 01/13/21 (negative) - would recommend repeat test prior to therapy start and then while on therapy. Prescription dose and frequency assessed for appropriateness.   Current medication list in Epic reviewed, no relevant/significant DDIs with Xeloda identified.  Evaluated chart and no patient barriers to medication adherence noted.   Patient agreement for treatment documented in MD note on 02/17/21.  Prescription has been e-scribed to the [facility] for benefits analysis and approval.  Oral Oncology Clinic will continue to follow for insurance authorization, copayment issues, initial counseling and start date.  Leron Croak, PharmD, BCPS Hematology/Oncology Clinical Pharmacist [facility] and [place] [phone] 02/18/2021 8:42 AM

## 2021-02-22 ENCOUNTER — Other Ambulatory Visit: Payer: Self-pay | Admitting: Genetic Counselor

## 2021-02-22 DIAGNOSIS — C23 Malignant neoplasm of gallbladder: Secondary | ICD-10-CM

## 2021-02-22 NOTE — Progress Notes (Signed)
Surgical Instructions    Your procedure is scheduled on 02/26/21.  Report to [facility] Main Entrance "A" at 10:15 A.M., then check in with the Admitting office.  Call this number if you have problems the morning of surgery:  [phone]   If you have any questions prior to your surgery date call [phone]: Open Monday-Friday 8am-4pm    Remember:  Do not eat after midnight the night before your surgery  You may drink clear liquids until 9:15am the morning of your surgery.   Clear liquids allowed are: Water, Non-Citrus Juices (without pulp), Carbonated Beverages, Clear Tea, Black Coffee ONLY (NO MILK, CREAM OR POWDERED CREAMER of any kind), and Gatorade  Patient Instructions  The night before surgery:  No food after midnight. ONLY clear liquids after midnight  The day of surgery (if you do NOT have diabetes):  Drink TWO (2) Pre-Surgery Clear Ensure the night before your surgery. Drink ONE (1) Pre-Surgery Clear Ensure by 9:15am the morning of your surgery. Drink in one sitting. Do not sip.  This drink was given to you during your hospital  pre-op appointment visit. Nothing else to drink after completing the  Pre-Surgery Clear Ensure. (2 night before, 1 morning of surgery)         If you have questions, please contact your surgeons office.     Take these medicines the morning of surgery with A SIP OF WATER: NONE.  Please consult your surgeons for instructions on when to stop taking capecitabine (XELODA).    As of today, STOP taking any Aspirin (unless otherwise instructed by your surgeon) Aleve, Naproxen, Ibuprofen, Motrin, Advil, Goody's, BC's, all herbal medications, fish oil, and all vitamins.  After your COVID test   You are not required to quarantine however you are required to wear a well-fitting mask when you are out and around people not in your household.  If your mask becomes wet or soiled, replace with a new one.  Wash your hands often with soap and water for  20 seconds or clean your hands with an alcohol-based hand sanitizer that contains at least 60% alcohol.  Do not share personal items.  Notify your provider: if you are in close contact with someone who has COVID  or if you develop a fever of 100.4 or greater, sneezing, cough, sore throat, shortness of breath or body aches.           Do not wear jewelry or makeup Do not wear lotions, powders, perfumes/colognes, or deodorant. Do not shave 48 hours prior to surgery.   Do not bring valuables to the hospital. Do not wear nail polish, gel polish, artificial nails, or any other type of covering on natural nails (fingers and toes) If you have artificial nails or gel coating that need to be removed by a nail salon, please have this removed prior to surgery. Artificial nails or gel coating may interfere with anesthesia's ability to adequately monitor your vital signs.             [facility] is not responsible for any belongings or valuables.  Do NOT Smoke (Tobacco/Vaping)  24 hours prior to your procedure  If you use a CPAP at night, you may bring your mask for your overnight stay.   Contacts, glasses, hearing aids, dentures or partials may not be worn into surgery, please bring cases for these belongings   For patients admitted to the hospital, discharge time will be determined by your treatment team.   Patients discharged  the day of surgery will not be allowed to drive home, and someone needs to stay with them for 24 hours.  NO VISITORS WILL BE ALLOWED IN PRE-OP WHERE PATIENTS ARE PREPPED FOR SURGERY.  ONLY 1 SUPPORT PERSON MAY BE PRESENT IN THE WAITING ROOM WHILE YOU ARE IN SURGERY.  IF YOU ARE TO BE ADMITTED, ONCE YOU ARE IN YOUR ROOM YOU WILL BE ALLOWED TWO (2) VISITORS. 1 (ONE) VISITOR MAY STAY OVERNIGHT BUT MUST ARRIVE TO THE ROOM BY 8pm.  Minor children may have two parents present. Special consideration for safety and communication needs will be reviewed on a case by case  basis.  Special instructions:    Oral Hygiene is also important to reduce your risk of infection.  Remember - BRUSH YOUR TEETH THE MORNING OF SURGERY WITH YOUR REGULAR TOOTHPASTE   [place]- Preparing For Surgery  Before surgery, you can play an important role. Because skin is not sterile, your skin needs to be as free of germs as possible. You can reduce the number of germs on your skin by washing with CHG (chlorahexidine gluconate) Soap before surgery.  CHG is an antiseptic cleaner which kills germs and bonds with the skin to continue killing germs even after washing.     Please do not use if you have an allergy to CHG or antibacterial soaps. If your skin becomes reddened/irritated stop using the CHG.  Do not shave (including legs and underarms) for at least 48 hours prior to first CHG shower. It is OK to shave your face.  Please follow these instructions carefully.     Shower the NIGHT BEFORE SURGERY and the MORNING OF SURGERY with CHG Soap.   If you chose to wash your hair, wash your hair first as usual with your normal shampoo. After you shampoo, rinse your hair and body thoroughly to remove the shampoo.  Then [employer] and genitals (private parts) with your normal soap and rinse thoroughly to remove soap.  After that Use CHG Soap as you would any other liquid soap. You can apply CHG directly to the skin and wash gently with a scrungie or a clean washcloth.   Apply the CHG Soap to your body ONLY FROM THE NECK DOWN.  Do not use on open wounds or open sores. Avoid contact with your eyes, ears, mouth and genitals (private parts). Wash Face and genitals (private parts)  with your normal soap.   Wash thoroughly, paying special attention to the area where your surgery will be performed.  Thoroughly rinse your body with warm water from the neck down.  DO NOT shower/wash with your normal soap after using and rinsing off the CHG Soap.  Pat yourself dry with a CLEAN TOWEL.  Wear CLEAN  PAJAMAS to bed the night before surgery  Place CLEAN SHEETS on your bed the night before your surgery  DO NOT SLEEP WITH PETS.   Day of Surgery: Take a shower with CHG soap. Wear Clean/Comfortable clothing the morning of surgery Do not apply any deodorants/lotions.   Remember to brush your teeth WITH YOUR REGULAR TOOTHPASTE.   Please read over the following fact sheets that you were given.

## 2021-02-23 ENCOUNTER — Other Ambulatory Visit: Payer: Self-pay

## 2021-02-23 ENCOUNTER — Encounter (HOSPITAL_COMMUNITY): Payer: Self-pay

## 2021-02-23 ENCOUNTER — Encounter (HOSPITAL_COMMUNITY)
Admission: RE | Admit: 2021-02-23 | Discharge: 2021-02-23 | Disposition: A | Discharge status: Home / Self Care | Payer: Commercial Managed Care - HMO | Source: Ambulatory Visit | Attending: Surgery | Admitting: Surgery

## 2021-02-23 VITALS — BP 131/87 | HR 91 | Temp 98.8°F | Resp 18 | Ht 66.0 in | Wt 192.9 lb

## 2021-02-23 DIAGNOSIS — Z01818 Encounter for other preprocedural examination: Secondary | ICD-10-CM | POA: Insufficient documentation

## 2021-02-23 DIAGNOSIS — G459 Transient cerebral ischemic attack, unspecified: Secondary | ICD-10-CM

## 2021-02-23 DIAGNOSIS — C23 Malignant neoplasm of gallbladder: Secondary | ICD-10-CM

## 2021-02-23 DIAGNOSIS — Z20822 Contact with and (suspected) exposure to covid-19: Secondary | ICD-10-CM | POA: Insufficient documentation

## 2021-02-23 HISTORY — DX: Gastro-esophageal reflux disease without esophagitis: K21.9

## 2021-02-23 LAB — CBC
HCT: 43.9 % (ref 36.0–46.0)
Hemoglobin: 14 g/dL (ref 12.0–15.0)
MCH: 26.5 pg (ref 26.0–34.0)
MCHC: 31.9 g/dL (ref 30.0–36.0)
MCV: 83.1 fL (ref 80.0–100.0)
Platelets: 312 10*3/uL (ref 150–400)
RBC: 5.28 MIL/uL — ABNORMAL HIGH (ref 3.87–5.11)
RDW: 12.4 % (ref 11.5–15.5)
WBC: 7.4 10*3/uL (ref 4.0–10.5)
nRBC: 0 % (ref 0.0–0.2)

## 2021-02-23 LAB — SARS CORONAVIRUS 2 (TAT 6-24 HRS): SARS Coronavirus 2: NEGATIVE

## 2021-02-23 LAB — BASIC METABOLIC PANEL
Anion gap: 7 (ref 5–15)
BUN: 8 mg/dL (ref 6–20)
CO2: 26 mmol/L (ref 22–32)
Calcium: 9.1 mg/dL (ref 8.9–10.3)
Chloride: 107 mmol/L (ref 98–111)
Creatinine, Ser: 0.69 mg/dL (ref 0.44–1.00)
GFR, Estimated: >60 mL/min (ref >60)
Glucose, Bld: 102 mg/dL — ABNORMAL HIGH (ref 70–99)
Potassium: 3.7 mmol/L (ref 3.5–5.1)
Sodium: 140 mmol/L (ref 135–145)

## 2021-02-23 NOTE — Progress Notes (Signed)
PCP - no pcp   PPM/ICD - denies   Chest x-ray - n/a EKG - 02/23/21 Stress Test - n/a ECHO - 01/31/12 Cardiac Cath - n/a  Sleep Study - no sleep apnea   As of today, STOP taking any Aspirin (unless otherwise instructed by your surgeon) Aleve, Naproxen, Ibuprofen, Motrin, Advil, Goody's, BC's, all herbal medications, fish oil, and all vitamins.  ERAS Protcol -yes, 2 ensure the night before and one the morning of surgery   COVID TEST- 02/23/21   Anesthesia review: no  Patient denies shortness of breath, fever, cough and chest pain at PAT appointment   All instructions explained to the patient, with a verbal understanding of the material. Patient agrees to go over the instructions while at home for a better understanding. Patient also instructed to self quarantine after being tested for COVID-19. The opportunity to ask questions was provided.

## 2021-02-24 ENCOUNTER — Inpatient Hospital Stay: Payer: Managed Care, Other (non HMO) | Admitting: Genetic Counselor

## 2021-02-24 ENCOUNTER — Other Ambulatory Visit: Payer: Managed Care, Other (non HMO)

## 2021-02-26 ENCOUNTER — Other Ambulatory Visit: Payer: Self-pay

## 2021-02-26 ENCOUNTER — Inpatient Hospital Stay (HOSPITAL_COMMUNITY)
Admission: RE | Admit: 2021-02-26 | Discharge: 2021-03-01 | DRG: 422 | Disposition: A | Discharge status: Home / Self Care | Payer: Commercial Managed Care - HMO | Attending: Surgery | Admitting: Surgery

## 2021-02-26 ENCOUNTER — Inpatient Hospital Stay (HOSPITAL_COMMUNITY): Payer: Commercial Managed Care - HMO | Admitting: Certified Registered"

## 2021-02-26 ENCOUNTER — Encounter (HOSPITAL_COMMUNITY): Payer: Self-pay | Admitting: Surgery

## 2021-02-26 ENCOUNTER — Encounter (HOSPITAL_COMMUNITY)
Admission: RE | Disposition: A | Discharge status: Home / Self Care | Payer: Self-pay | Source: Home / Self Care | Attending: Surgery

## 2021-02-26 DIAGNOSIS — Z8673 Personal history of transient ischemic attack (TIA), and cerebral infarction without residual deficits: Secondary | ICD-10-CM | POA: Diagnosis not present

## 2021-02-26 DIAGNOSIS — Z8249 Family history of ischemic heart disease and other diseases of the circulatory system: Secondary | ICD-10-CM | POA: Diagnosis not present

## 2021-02-26 DIAGNOSIS — Z833 Family history of diabetes mellitus: Secondary | ICD-10-CM

## 2021-02-26 DIAGNOSIS — F419 Anxiety disorder, unspecified: Secondary | ICD-10-CM | POA: Diagnosis present

## 2021-02-26 DIAGNOSIS — I671 Cerebral aneurysm, nonruptured: Secondary | ICD-10-CM | POA: Diagnosis present

## 2021-02-26 DIAGNOSIS — C23 Malignant neoplasm of gallbladder: Principal | ICD-10-CM | POA: Diagnosis present

## 2021-02-26 DIAGNOSIS — Z20822 Contact with and (suspected) exposure to covid-19: Secondary | ICD-10-CM | POA: Diagnosis present

## 2021-02-26 DIAGNOSIS — Z9049 Acquired absence of other specified parts of digestive tract: Secondary | ICD-10-CM

## 2021-02-26 DIAGNOSIS — K760 Fatty (change of) liver, not elsewhere classified: Secondary | ICD-10-CM | POA: Diagnosis present

## 2021-02-26 DIAGNOSIS — Z809 Family history of malignant neoplasm, unspecified: Secondary | ICD-10-CM

## 2021-02-26 HISTORY — PX: LAPAROSCOPY: SHX197

## 2021-02-26 HISTORY — PX: OPEN HEPATECTOMY [83]: SHX5986

## 2021-02-26 HISTORY — DX: Malignant (primary) neoplasm, unspecified: C80.1

## 2021-02-26 LAB — COMPREHENSIVE METABOLIC PANEL
ALT: 151 U/L — ABNORMAL HIGH (ref 0–44)
AST: 182 U/L — ABNORMAL HIGH (ref 15–41)
Albumin: 3.9 g/dL (ref 3.5–5.0)
Alkaline Phosphatase: 113 U/L (ref 38–126)
Anion gap: 9 (ref 5–15)
BUN: 8 mg/dL (ref 6–20)
CO2: 25 mmol/L (ref 22–32)
Calcium: 8.6 mg/dL — ABNORMAL LOW (ref 8.9–10.3)
Chloride: 106 mmol/L (ref 98–111)
Creatinine, Ser: 0.79 mg/dL (ref 0.44–1.00)
GFR, Estimated: >60 mL/min (ref >60)
Glucose, Bld: 127 mg/dL — ABNORMAL HIGH (ref 70–99)
Potassium: 3.8 mmol/L (ref 3.5–5.1)
Sodium: 140 mmol/L (ref 135–145)
Total Bilirubin: 1.3 mg/dL — ABNORMAL HIGH (ref 0.3–1.2)
Total Protein: 6.6 g/dL (ref 6.5–8.1)

## 2021-02-26 LAB — PREPARE RBC (CROSSMATCH)

## 2021-02-26 LAB — POCT PREGNANCY, URINE: Preg Test, Ur: NEGATIVE

## 2021-02-26 LAB — CBC
HCT: 41.6 % (ref 36.0–46.0)
Hemoglobin: 13.5 g/dL (ref 12.0–15.0)
MCH: 27.1 pg (ref 26.0–34.0)
MCHC: 32.5 g/dL (ref 30.0–36.0)
MCV: 83.5 fL (ref 80.0–100.0)
Platelets: 359 10*3/uL (ref 150–400)
RBC: 4.98 MIL/uL (ref 3.87–5.11)
RDW: 12.3 % (ref 11.5–15.5)
WBC: 27.1 10*3/uL — ABNORMAL HIGH (ref 4.0–10.5)
nRBC: 0 % (ref 0.0–0.2)

## 2021-02-26 SURGERY — LAPAROSCOPY, DIAGNOSTIC
Anesthesia: General | Site: Abdomen

## 2021-02-26 MED ORDER — ONDANSETRON HCL 4 MG/2ML IJ SOLN
INTRAMUSCULAR | Status: DC | PRN
  Administered 2021-02-26: 4 mg via INTRAVENOUS

## 2021-02-26 MED ORDER — ONDANSETRON HCL 4 MG/2ML IJ SOLN
4.0000 mg | Freq: Four times a day (QID) | INTRAMUSCULAR | Status: DC | PRN
  Administered 2021-02-26 – 2021-02-28 (×5): 4 mg via INTRAVENOUS
  Filled 2021-02-26 (×5): qty 2

## 2021-02-26 MED ORDER — KETOROLAC TROMETHAMINE 15 MG/ML IJ SOLN
15.0000 mg | Freq: Three times a day (TID) | INTRAMUSCULAR | Status: AC
  Administered 2021-02-26 – 2021-02-28 (×5): 15 mg via INTRAVENOUS
  Filled 2021-02-26 (×7): qty 1

## 2021-02-26 MED ORDER — FENTANYL CITRATE (PF) 250 MCG/5ML IJ SOLN
INTRAMUSCULAR | Status: AC
  Filled 2021-02-26: qty 5

## 2021-02-26 MED ORDER — GLYCOPYRROLATE PF 0.2 MG/ML IJ SOSY
PREFILLED_SYRINGE | INTRAMUSCULAR | Status: AC
  Filled 2021-02-26: qty 1

## 2021-02-26 MED ORDER — HYDROMORPHONE HCL 1 MG/ML IJ SOLN
INTRAMUSCULAR | Status: AC
  Filled 2021-02-26: qty 0.5

## 2021-02-26 MED ORDER — CEFAZOLIN SODIUM-DEXTROSE 2-4 GM/100ML-% IV SOLN
2.0000 g | INTRAVENOUS | Status: AC
  Administered 2021-02-26: 2 g via INTRAVENOUS
  Filled 2021-02-26: qty 100

## 2021-02-26 MED ORDER — LACTATED RINGERS IV SOLN: INTRAVENOUS | Status: DC

## 2021-02-26 MED ORDER — ORAL CARE MOUTH RINSE: 15.0000 mL | Freq: Once | OROMUCOSAL | Status: AC

## 2021-02-26 MED ORDER — DOCUSATE SODIUM 100 MG PO CAPS
100.0000 mg | ORAL_CAPSULE | Freq: Two times a day (BID) | ORAL | Status: DC
  Administered 2021-02-26 – 2021-03-01 (×6): 100 mg via ORAL
  Filled 2021-02-26 (×6): qty 1

## 2021-02-26 MED ORDER — PROPOFOL 10 MG/ML IV BOLUS
INTRAVENOUS | Status: DC | PRN
  Administered 2021-02-26: 165 mg via INTRAVENOUS

## 2021-02-26 MED ORDER — ONDANSETRON 4 MG PO TBDP: 4.0000 mg | ORAL_TABLET | Freq: Four times a day (QID) | ORAL | Status: DC | PRN

## 2021-02-26 MED ORDER — PROPOFOL 10 MG/ML IV BOLUS
INTRAVENOUS | Status: AC
  Filled 2021-02-26: qty 20

## 2021-02-26 MED ORDER — METHOCARBAMOL 500 MG PO TABS
500.0000 mg | ORAL_TABLET | Freq: Four times a day (QID) | ORAL | Status: DC
  Administered 2021-02-26 – 2021-03-01 (×8): 500 mg via ORAL
  Filled 2021-02-26 (×10): qty 1

## 2021-02-26 MED ORDER — HEMOSTATIC AGENTS (NO CHARGE) OPTIME
TOPICAL | Status: DC | PRN
  Administered 2021-02-26: 1 via TOPICAL

## 2021-02-26 MED ORDER — FENTANYL CITRATE (PF) 100 MCG/2ML IJ SOLN
INTRAMUSCULAR | Status: AC
  Filled 2021-02-26: qty 2

## 2021-02-26 MED ORDER — SUGAMMADEX SODIUM 200 MG/2ML IV SOLN
INTRAVENOUS | Status: DC | PRN
  Administered 2021-02-26: 200 mg via INTRAVENOUS

## 2021-02-26 MED ORDER — HYDROMORPHONE HCL 1 MG/ML IJ SOLN
0.5000 mg | INTRAMUSCULAR | Status: DC | PRN
  Administered 2021-02-26 – 2021-02-27 (×3): 0.5 mg via INTRAVENOUS
  Filled 2021-02-26 (×3): qty 0.5

## 2021-02-26 MED ORDER — POLYETHYLENE GLYCOL 3350 17 G PO PACK
17.0000 g | PACK | Freq: Every day | ORAL | Status: DC | PRN
  Administered 2021-02-28: 17 g via ORAL
  Filled 2021-02-26: qty 1

## 2021-02-26 MED ORDER — ACETAMINOPHEN 500 MG PO TABS
1000.0000 mg | ORAL_TABLET | ORAL | Status: AC
  Administered 2021-02-26: 1000 mg via ORAL
  Filled 2021-02-26: qty 2

## 2021-02-26 MED ORDER — ACETAMINOPHEN 500 MG PO TABS
1000.0000 mg | ORAL_TABLET | Freq: Three times a day (TID) | ORAL | Status: DC
  Administered 2021-02-26 – 2021-03-01 (×8): 1000 mg via ORAL
  Filled 2021-02-26 (×8): qty 2

## 2021-02-26 MED ORDER — DIPHENHYDRAMINE HCL 25 MG PO CAPS: 25.0000 mg | ORAL_CAPSULE | Freq: Four times a day (QID) | ORAL | Status: DC | PRN

## 2021-02-26 MED ORDER — ENOXAPARIN SODIUM 40 MG/0.4ML IJ SOSY
40.0000 mg | PREFILLED_SYRINGE | INTRAMUSCULAR | Status: DC
  Administered 2021-02-27 – 2021-03-01 (×3): 40 mg via SUBCUTANEOUS
  Filled 2021-02-26 (×3): qty 0.4

## 2021-02-26 MED ORDER — MIDAZOLAM HCL 2 MG/2ML IJ SOLN
INTRAMUSCULAR | Status: AC
  Filled 2021-02-26: qty 2

## 2021-02-26 MED ORDER — ENSURE PRE-SURGERY PO LIQD: 592.0000 mL | Freq: Once | ORAL | Status: DC

## 2021-02-26 MED ORDER — ROCURONIUM BROMIDE 10 MG/ML (PF) SYRINGE
PREFILLED_SYRINGE | INTRAVENOUS | Status: DC | PRN
  Administered 2021-02-26: 20 mg via INTRAVENOUS
  Administered 2021-02-26: 50 mg via INTRAVENOUS
  Administered 2021-02-26 (×2): 10 mg via INTRAVENOUS

## 2021-02-26 MED ORDER — ALBUMIN HUMAN 5 % IV SOLN: INTRAVENOUS | Status: DC | PRN

## 2021-02-26 MED ORDER — DIPHENHYDRAMINE HCL 50 MG/ML IJ SOLN: 25.0000 mg | Freq: Four times a day (QID) | INTRAMUSCULAR | Status: DC | PRN

## 2021-02-26 MED ORDER — SIMETHICONE 80 MG PO CHEW
40.0000 mg | CHEWABLE_TABLET | Freq: Four times a day (QID) | ORAL | Status: DC | PRN
  Administered 2021-02-27: 40 mg via ORAL
  Filled 2021-02-26: qty 1

## 2021-02-26 MED ORDER — HYDROMORPHONE HCL 1 MG/ML IJ SOLN
INTRAMUSCULAR | Status: DC | PRN
  Administered 2021-02-26: .5 mg via INTRAVENOUS

## 2021-02-26 MED ORDER — MIDAZOLAM HCL 2 MG/2ML IJ SOLN
INTRAMUSCULAR | Status: DC | PRN
Start: 2021-02-26 — End: 2021-02-26
  Administered 2021-02-26: 2 mg via INTRAVENOUS

## 2021-02-26 MED ORDER — OXYCODONE HCL 5 MG PO TABS
5.0000 mg | ORAL_TABLET | ORAL | Status: DC | PRN
  Administered 2021-02-26 – 2021-02-27 (×2): 10 mg via ORAL
  Filled 2021-02-26 (×2): qty 2

## 2021-02-26 MED ORDER — ENSURE PRE-SURGERY PO LIQD: 296.0000 mL | Freq: Once | ORAL | Status: DC

## 2021-02-26 MED ORDER — DEXAMETHASONE SODIUM PHOSPHATE 10 MG/ML IJ SOLN
INTRAMUSCULAR | Status: DC | PRN
  Administered 2021-02-26: 5 mg via INTRAVENOUS

## 2021-02-26 MED ORDER — CHLORHEXIDINE GLUCONATE 0.12 % MT SOLN
15.0000 mL | Freq: Once | OROMUCOSAL | Status: AC
  Administered 2021-02-26: 15 mL via OROMUCOSAL
  Filled 2021-02-26: qty 15

## 2021-02-26 MED ORDER — LIDOCAINE 2% (20 MG/ML) 5 ML SYRINGE
INTRAMUSCULAR | Status: AC
  Filled 2021-02-26: qty 5

## 2021-02-26 MED ORDER — ONDANSETRON HCL 4 MG/2ML IJ SOLN
INTRAMUSCULAR | Status: AC
  Filled 2021-02-26: qty 2

## 2021-02-26 MED ORDER — ROCURONIUM BROMIDE 10 MG/ML (PF) SYRINGE
PREFILLED_SYRINGE | INTRAVENOUS | Status: AC
  Filled 2021-02-26: qty 10

## 2021-02-26 MED ORDER — DEXAMETHASONE SODIUM PHOSPHATE 10 MG/ML IJ SOLN
INTRAMUSCULAR | Status: AC
  Filled 2021-02-26: qty 1

## 2021-02-26 MED ORDER — GABAPENTIN 300 MG PO CAPS
300.0000 mg | ORAL_CAPSULE | ORAL | Status: AC
  Administered 2021-02-26: 300 mg via ORAL
  Filled 2021-02-26: qty 1

## 2021-02-26 MED ORDER — FENTANYL CITRATE (PF) 100 MCG/2ML IJ SOLN
25.0000 ug | INTRAMUSCULAR | Status: DC | PRN
  Administered 2021-02-26: 50 ug via INTRAVENOUS
  Administered 2021-02-26 (×2): 25 ug via INTRAVENOUS
  Administered 2021-02-26: 50 ug via INTRAVENOUS

## 2021-02-26 MED ORDER — 0.9 % SODIUM CHLORIDE (POUR BTL) OPTIME
TOPICAL | Status: DC | PRN
Start: 2021-02-26 — End: 2021-02-26
  Administered 2021-02-26: 1000 mL

## 2021-02-26 MED ORDER — BUPIVACAINE HCL (PF) 0.25 % IJ SOLN
INTRAMUSCULAR | Status: AC
  Filled 2021-02-26: qty 30

## 2021-02-26 MED ORDER — FENTANYL CITRATE (PF) 100 MCG/2ML IJ SOLN
INTRAMUSCULAR | Status: DC | PRN
  Administered 2021-02-26: 100 ug via INTRAVENOUS
  Administered 2021-02-26: 50 ug via INTRAVENOUS
  Administered 2021-02-26: 100 ug via INTRAVENOUS

## 2021-02-26 MED ORDER — LIDOCAINE 2% (20 MG/ML) 5 ML SYRINGE
INTRAMUSCULAR | Status: DC | PRN
Start: 2021-02-26 — End: 2021-02-26
  Administered 2021-02-26: 100 mg via INTRAVENOUS

## 2021-02-26 MED ORDER — BUPIVACAINE-EPINEPHRINE (PF) 0.25% -1:200000 IJ SOLN
INTRAMUSCULAR | Status: AC
  Filled 2021-02-26: qty 30

## 2021-02-26 SURGICAL SUPPLY — 78 items
BAG COUNTER SPONGE SURGICOUNT (BAG) ×2 IMPLANT
BIOPATCH RED 1 DISK 7.0 (GAUZE/BANDAGES/DRESSINGS) ×3 IMPLANT
BLADE CLIPPER SURG (BLADE) IMPLANT
BOOT SUTURE AID YELLOW STND (SUTURE) IMPLANT
CANISTER SUCT 3000ML PPV (MISCELLANEOUS) ×2 IMPLANT
CHLORAPREP W/TINT 26 (MISCELLANEOUS) ×2 IMPLANT
CLIP TI MEDIUM 24 (CLIP) ×1 IMPLANT
CLIP TI WIDE RED SMALL 24 (CLIP) ×3 IMPLANT
CLIP VESOCCLUDE MED 24/CT (CLIP) ×2 IMPLANT
CNTNR URN SCR LID CUP LEK RST (MISCELLANEOUS) IMPLANT
CONT SPEC 4OZ STRL OR WHT (MISCELLANEOUS)
COVER SURGICAL LIGHT HANDLE (MISCELLANEOUS) ×2 IMPLANT
DERMABOND ADVANCED (GAUZE/BANDAGES/DRESSINGS) ×1
DERMABOND ADVANCED .7 DNX12 (GAUZE/BANDAGES/DRESSINGS) ×2 IMPLANT
DRAIN CHANNEL 19F RND (DRAIN) ×3 IMPLANT
DRAPE INCISE IOBAN 66X45 STRL (DRAPES) ×2 IMPLANT
DRAPE LAPAROSCOPIC ABDOMINAL (DRAPES) ×2 IMPLANT
DRAPE WARM FLUID 44X44 (DRAPES) ×2 IMPLANT
DRSG TEGADERM 4X4.75 (GAUZE/BANDAGES/DRESSINGS) ×3 IMPLANT
DRSG TELFA 3X8 NADH (GAUZE/BANDAGES/DRESSINGS) IMPLANT
ELECT BLADE 6.5 EXT (BLADE) ×2 IMPLANT
ELECT CAUTERY BLADE 6.4 (BLADE) ×2 IMPLANT
ELECT PAD DSPR THERM+ ADLT (MISCELLANEOUS) ×2 IMPLANT
ELECT REM PT RETURN 9FT ADLT (ELECTROSURGICAL) ×2
ELECTRODE REM PT RTRN 9FT ADLT (ELECTROSURGICAL) ×1 IMPLANT
EVACUATOR SILICONE 100CC (DRAIN) ×3 IMPLANT
GAUZE 4X4 16PLY ~~LOC~~+RFID DBL (SPONGE) IMPLANT
GAUZE SPONGE 2X2 8PLY STRL LF (GAUZE/BANDAGES/DRESSINGS) IMPLANT
GLOVE SURG POLY MICRO LF SZ5.5 (GLOVE) ×4 IMPLANT
GLOVE SURG UNDER POLY LF SZ6 (GLOVE) ×2 IMPLANT
GOWN STRL REUS W/ TWL LRG LVL3 (GOWN DISPOSABLE) ×2 IMPLANT
GOWN STRL REUS W/TWL LRG LVL3 (GOWN DISPOSABLE) ×2
HAND PENCIL TRP OPTION (MISCELLANEOUS) ×2 IMPLANT
HANDLE SUCTION POOLE (INSTRUMENTS) ×1 IMPLANT
HEMOSTAT HEMOBLAST BELLOWS (HEMOSTASIS) ×1 IMPLANT
HEMOSTAT SNOW SURGICEL 2X4 (HEMOSTASIS) ×1 IMPLANT
KIT BASIN OR (CUSTOM PROCEDURE TRAY) ×2 IMPLANT
KIT TURNOVER KIT B (KITS) ×2 IMPLANT
NDL INSUFFLATION 14GA 120MM (NEEDLE) ×1 IMPLANT
NEEDLE INSUFFLATION 14GA 120MM (NEEDLE) ×2 IMPLANT
NS IRRIG 1000ML POUR BTL (IV SOLUTION) ×4 IMPLANT
PAD ARMBOARD 7.5X6 YLW CONV (MISCELLANEOUS) ×4 IMPLANT
PAD DRESSING TELFA 3X8 NADH (GAUZE/BANDAGES/DRESSINGS) IMPLANT
PENCIL SMOKE EVACUATOR (MISCELLANEOUS) ×2 IMPLANT
RETRACTOR WOUND ALXS 34CM XLRG (MISCELLANEOUS) IMPLANT
RTRCTR WOUND ALEXIS 34CM XLRG (MISCELLANEOUS) ×2
SEALER BIPOLAR AQUA 6.0 (INSTRUMENTS) ×2 IMPLANT
SET IRRIG TUBING LAPAROSCOPIC (IRRIGATION / IRRIGATOR) IMPLANT
SET TUBE SMOKE EVAC HIGH FLOW (TUBING) ×2 IMPLANT
SLEEVE ENDOPATH XCEL 5M (ENDOMECHANICALS) ×2 IMPLANT
SPONGE GAUZE 2X2 STER 10/PKG (GAUZE/BANDAGES/DRESSINGS) ×1
SPONGE T-LAP 18X18 ~~LOC~~+RFID (SPONGE) ×2 IMPLANT
SUCTION POOLE HANDLE (INSTRUMENTS) ×2
SUT CHROMIC 0 BP (SUTURE) ×5 IMPLANT
SUT CHROMIC 3 0 CT 36 (SUTURE) IMPLANT
SUT ETHILON 2 0 FS 18 (SUTURE) ×2 IMPLANT
SUT MNCRL AB 4-0 PS2 18 (SUTURE) ×3 IMPLANT
SUT PDS AB 1 TP1 96 (SUTURE) ×4 IMPLANT
SUT PDS AB 4-0 RB1 27 (SUTURE) ×1 IMPLANT
SUT PROLENE 2 0 SH DA (SUTURE) IMPLANT
SUT PROLENE 3 0 SH 48 (SUTURE) ×2 IMPLANT
SUT PROLENE 4 0 RB 1 (SUTURE) ×1
SUT PROLENE 4-0 RB1 .5 CRCL 36 (SUTURE) ×2 IMPLANT
SUT SILK 2 0 SH (SUTURE) IMPLANT
SUT SILK 2 0 TIES 10X30 (SUTURE) ×2 IMPLANT
SUT SILK 2 0SH CR/8 30 (SUTURE) IMPLANT
SUT SILK 3 0 TIES 10X30 (SUTURE) ×1 IMPLANT
SUT SILK 3 0SH CR/8 30 (SUTURE) IMPLANT
SUT VIC AB 3-0 SH 27 (SUTURE) ×2
SUT VIC AB 3-0 SH 27X BRD (SUTURE) ×1 IMPLANT
SYR BULB IRRIG 60ML STRL (SYRINGE) ×2 IMPLANT
TOWEL GREEN STERILE (TOWEL DISPOSABLE) ×2 IMPLANT
TOWEL GREEN STERILE FF (TOWEL DISPOSABLE) ×2 IMPLANT
TRAY FOLEY MTR SLVR 14FR STAT (SET/KITS/TRAYS/PACK) ×2 IMPLANT
TRAY LAPAROSCOPIC MC (CUSTOM PROCEDURE TRAY) ×2 IMPLANT
TROCAR XCEL NON-BLD 5MMX100MML (ENDOMECHANICALS) ×2 IMPLANT
TUBE CONNECTING 12X1/4 (SUCTIONS) IMPLANT
WARMER LAPAROSCOPE (MISCELLANEOUS) ×2 IMPLANT

## 2021-02-26 NOTE — Transfer of Care (Signed)
Immediate Anesthesia Transfer of Care Note  Patient: Shanitha Twining  Procedure(s) Performed: STAGING LAPAROSCOPY (Abdomen) OPEN PARTIAL CENTRAL HEPATECTOMY, PORTAL LYMPH NODE DISSECTION, INTRAOPERATIVE ULTRASOUND (Abdomen)  Patient Location: PACU  Anesthesia Type:General  Level of Consciousness: drowsy  Airway & Oxygen Therapy: Patient Spontanous Breathing and Patient connected to nasal cannula oxygen  Post-op Assessment: Report given to RN and Post -op Vital signs reviewed and stable  Post vital signs: Reviewed and stable  Last Vitals:  Vitals Value Taken Time  BP 117/75 02/26/21 1349  Temp    Pulse 83 02/26/21 1351  Resp 12 02/26/21 1351  SpO2 100 % 02/26/21 1351  Vitals shown include unvalidated device data.  Last Pain:  Vitals:   02/26/21 0936  TempSrc:   PainSc: 0-No pain      Patients Stated Pain Goal: 1 (37/90/24 0973)  Complications: No notable events documented.

## 2021-02-26 NOTE — Op Note (Signed)
Date: 02/26/21  Patient: Shelby Cunningham MRN: [id number]  Preoperative Diagnosis: Gallbladder adenocarcinoma Postoperative Diagnosis: Same  Procedure: Staging laparoscopy Open partial central hepatectomy (segments 4b and 5) Portal lymph node dissection  Surgeon: Michaelle Birks, MD Assistant: Michael Boston, MD  EBL: 200 mL  Anesthesia: General  Specimens: Cystic duct margin Partial central liver (gallbladder fossa) Portal lymph nodes  Indications: Shelby Cunningham is a 36 yo female who presented with acute cholecystitis and underwent laparoscopic cholecystectomy on 01/14/2021.  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  Staging work-up showed no evidence of metastatic disease.  Oncologic resection was recommended.  Findings: No evidence of metastatic disease within the abdomen.  New cystic duct margin was negative for malignant cells on frozen section.  Procedure details: Informed consent was obtained in the preoperative area prior to the procedure. The patient was brought to the operating room and placed on the table in the supine position.  General anesthesia was induced and appropriate lines and drains were placed for intraoperative monitoring. Perioperative antibiotics were administered per SCIP guidelines. The abdomen was prepped and draped in the usual sterile fashion. A pre-procedure timeout was taken verifying patient identity, surgical site and procedure to be performed.  An supraumbilical skin incision was made, and the subcutaneous tissue was divided with cautery. The umbilical stalk was grasped and elevated and a Veress needle was inserted through the fascia.  Intraperitoneal placement was confirmed with a saline drop test and the abdomen was insufflated.  A 5 mm port was placed, and the peritoneal cavity was inspected, including the liver, both hemidiaphragms, and the peritoneal surface throughout the abdomen. There  was no evidence of metastatic disease. The port was removed and the abdomen was desufflated.  An upper midline skin incision was made and the subcutaneous tissue was divided with cautery.  The fascia was opened along the linea alba and the peritoneal cavity was entered.  An Allexis wound protector and Bookwalter fix retractor were placed.  The porta hepatis was palpated and was soft with no abnormally enlarged or firm lymph nodes.  The cystic duct stump was identified via the previously placed suture and clips.  The cystic duct remnant was carefully dissected off the gallbladder fossa using blunt dissection and cautery.  Once the cystic duct remnant had been mobilized, it was clamped about 3 millimeters proximal to its insertion into the common bile duct, and sharply transected.  This new cystic duct margin was marked with a suture and sent for frozen analysis, which was negative for malignant cells.  The new cystic duct stump was closed with a 5-0 PDS suture ligature.  Next a margin of resection around the gallbladder fossa was marked with cautery.  0 chromic stay sutures were placed in the adjacent liver parenchyma.  The liver parenchyma was then divided along the marked margin of transection using a crush-clamp technique.  Visible vessels were clipped prior to division.  The aquamantys was used to maintain hemostasis.  The specimen was excised and sent for routine pathology.  Next the porta hepatis was examined.  Lymph nodes on the posterolateral surface of the common bile duct were bluntly dissected out and excised.  More lymph nodes were excised from the proper hepatic artery and common hepatic artery using gentle blunt dissection.  All of these lymph nodes were sent for routine pathology.  At the completion of this dissection, there remained a palpable pulse within the common hepatic and proper hepatic arteries.  The  abdomen was irrigated with warm saline and appeared hemostatic.  A Valsalva maneuver  was performed and a clean lap pad was applied to the cut surface of the liver.  There was no evidence of bile leak or bleeding.  Hemoblast was applied to the cut surface of the liver.  An 28 [place] JP drain was left adjacent to the cut surface of the liver and brought out through the right lateral abdominal wall.  It was secured to the skin with 2-0 nylon suture.  The wound protector and retractors were removed, and the fascia was closed at midline with a running looped 1 PDS suture.  Scarpa's layer was closed with a running 3-0 Vicryl suture, and the skin was closed with a running subcuticular 4-0 Monocryl suture.  Dermabond was applied.  The patient tolerated the procedure well with no apparent complications.  All counts were correct x2 at the end of the procedure. The patient was extubated and taken to PACU in stable condition.  Michaelle Birks, MD 02/26/21 4:28 PM

## 2021-02-26 NOTE — Anesthesia Postprocedure Evaluation (Signed)
Anesthesia Post Note  Patient: Shelby Cunningham  Procedure(s) Performed: STAGING LAPAROSCOPY (Abdomen) OPEN PARTIAL CENTRAL HEPATECTOMY, PORTAL LYMPH NODE DISSECTION, INTRAOPERATIVE ULTRASOUND (Abdomen)     Patient location during evaluation: PACU Anesthesia Type: General Level of consciousness: awake Pain management: pain level controlled Vital Signs Assessment: post-procedure vital signs reviewed and stable Cardiovascular status: stable Postop Assessment: no apparent nausea or vomiting Anesthetic complications: no   No notable events documented.  Last Vitals:  Vitals:   02/26/21 1515 02/26/21 1540  BP: 120/83 117/85  Pulse: 90 89  Resp: (!) 24 16  Temp: 36.7 C 36.6 C  SpO2: 100% 97%    Last Pain:  Vitals:   02/26/21 1540  TempSrc: Oral  PainSc:                  [name]

## 2021-02-26 NOTE — Anesthesia Preprocedure Evaluation (Signed)
Anesthesia Evaluation  Patient identified by MRN, date of birth, ID band Patient awake    Reviewed: Allergy & Precautions, NPO status , Patient's Chart, lab work & pertinent test results  Airway Mallampati: II  TM Distance: >3 FB     Dental   Pulmonary    breath sounds clear to auscultation       Cardiovascular  Rhythm:Regular Rate:Normal     Neuro/Psych    GI/Hepatic Neg liver ROS, GERD  ,  Endo/Other  negative endocrine ROS  Renal/GU negative Renal ROS     Musculoskeletal   Abdominal   Peds  Hematology   Anesthesia Other Findings   Reproductive/Obstetrics                             Anesthesia Physical Anesthesia Plan  ASA: 2  Anesthesia Plan: General   Post-op Pain Management:    Induction: Intravenous  PONV Risk Score and Plan: 3 and Ondansetron, Dexamethasone and Midazolam  Airway Management Planned: Oral ETT  Additional Equipment:   Intra-op Plan:   Post-operative Plan: Extubation in OR  Informed Consent: I have reviewed the patients History and Physical, chart, labs and discussed the procedure including the risks, benefits and alternatives for the proposed anesthesia with the patient or authorized representative who has indicated his/her understanding and acceptance.     Dental advisory given  Plan Discussed with: CRNA  Anesthesia Plan Comments:         Anesthesia Quick Evaluation

## 2021-02-26 NOTE — Anesthesia Procedure Notes (Signed)
Procedure Name: Intubation Date/Time: 02/26/2021 11:28 AM Performed by: Moshe Salisbury, CRNA Pre-anesthesia Checklist: Patient identified, Emergency Drugs available, Suction available and Patient being monitored Patient Re-evaluated:Patient Re-evaluated prior to induction Oxygen Delivery Method: Circle System Utilized Preoxygenation: Pre-oxygenation with 100% oxygen Induction Type: IV induction Ventilation: Mask ventilation without difficulty Laryngoscope Size: Mac and 3 Grade View: Grade II Tube type: Oral Tube size: 7.5 mm Number of attempts: 1 Airway Equipment and Method: Stylet Placement Confirmation: ETT inserted through vocal cords under direct vision, positive ETCO2 and breath sounds checked- equal and bilateral Secured at: 21 cm Tube secured with: Tape Dental Injury: Teeth and Oropharynx as per pre-operative assessment

## 2021-02-26 NOTE — Interval H&P Note (Signed)
History and Physical Interval Note:  02/26/2021 10:48 AM  Shelby Cunningham  has presented today for surgery, with the diagnosis of gallbladder cancer.  The various methods of treatment have been discussed with the patient and family. After consideration of risks, benefits and other options for treatment, the patient has consented to  Procedure(s): STAGING LAPAROSCOPY (N/A) OPEN PARTIAL CENTRAL HEPATECTOMY, PORTAL LYMPH NODE DISSECTION, INTRAOPERATIVE ULTRASOUND (N/A) as a surgical intervention.  The patient's history has been reviewed, patient examined, no change in status, stable for surgery.  I have reviewed the patient's chart and labs.  Questions were answered to the patient's satisfaction.  Admit to inpatient postoperatively.   Dwan Bolt

## 2021-02-27 ENCOUNTER — Encounter (HOSPITAL_COMMUNITY): Payer: Self-pay | Admitting: Surgery

## 2021-02-27 LAB — COMPREHENSIVE METABOLIC PANEL
ALT: 237 U/L — ABNORMAL HIGH (ref 0–44)
AST: 204 U/L — ABNORMAL HIGH (ref 15–41)
Albumin: 3.5 g/dL (ref 3.5–5.0)
Alkaline Phosphatase: 100 U/L (ref 38–126)
Anion gap: 10 (ref 5–15)
BUN: 8 mg/dL (ref 6–20)
CO2: 24 mmol/L (ref 22–32)
Calcium: 8.7 mg/dL — ABNORMAL LOW (ref 8.9–10.3)
Chloride: 104 mmol/L (ref 98–111)
Creatinine, Ser: 0.71 mg/dL (ref 0.44–1.00)
GFR, Estimated: >60 mL/min (ref >60)
Glucose, Bld: 128 mg/dL — ABNORMAL HIGH (ref 70–99)
Potassium: 3.9 mmol/L (ref 3.5–5.1)
Sodium: 138 mmol/L (ref 135–145)
Total Bilirubin: 0.6 mg/dL (ref 0.3–1.2)
Total Protein: 6.3 g/dL — ABNORMAL LOW (ref 6.5–8.1)

## 2021-02-27 LAB — CBC
HCT: 38.8 % (ref 36.0–46.0)
Hemoglobin: 12.9 g/dL (ref 12.0–15.0)
MCH: 27.6 pg (ref 26.0–34.0)
MCHC: 33.2 g/dL (ref 30.0–36.0)
MCV: 83.1 fL (ref 80.0–100.0)
Platelets: 284 10*3/uL (ref 150–400)
RBC: 4.67 MIL/uL (ref 3.87–5.11)
RDW: 12.3 % (ref 11.5–15.5)
WBC: 15 10*3/uL — ABNORMAL HIGH (ref 4.0–10.5)
nRBC: 0 % (ref 0.0–0.2)

## 2021-02-27 MED ORDER — CHLORHEXIDINE GLUCONATE CLOTH 2 % EX PADS
6.0000 | MEDICATED_PAD | Freq: Every day | CUTANEOUS | Status: DC
  Administered 2021-02-27 – 2021-02-28 (×2): 6 via TOPICAL

## 2021-02-27 NOTE — Progress Notes (Signed)
1 Day Post-Op   Subjective/Chief Complaint: Reports pain control is good. Some nausea yesterday but better today. Has not gotten out of bed yet but plans to.    Objective: Vital signs in last 24 hours: Temp:  [97.6 F (36.4 C)-98.7 F (37.1 C)] 98.6 F (37 C) (02/11 0757) Pulse Rate:  [83-108] 105 (02/11 0757) Resp:  [13-24] 18 (02/11 0757) BP: (113-129)/(69-85) 113/69 (02/11 0757) SpO2:  [97 %-100 %] 97 % (02/11 0757)    Intake/Output from previous day: 02/10 0701 - 02/11 0700 In: 950 [I.V.:600; IV Piggyback:350] Out: 472 [Urine:100; Drains:72; Blood:300] Intake/Output this shift: No intake/output data recorded.  Alert, pleasant Unlabored respirations Abdomen soft, nondistended, appropriately tender. Incision c/d/I no cellulitis or hematoma. JP sanguinoserous, low volume Foley in place with dark yellow urine  Lab Results:  Recent Labs    02/26/21 1430 02/27/21 0237  WBC 27.1* 15.0*  HGB 13.5 12.9  HCT 41.6 38.8  PLT 359 284   BMET Recent Labs    02/26/21 1430 02/27/21 0237  NA 140 138  K 3.8 3.9  CL 106 104  CO2 25 24  GLUCOSE 127* 128*  BUN 8 8  CREATININE 0.79 0.71  CALCIUM 8.6* 8.7*   PT/INR No results for input(s): LABPROT, INR in the last 72 hours. ABG No results for input(s): PHART, HCO3 in the last 72 hours.  Invalid input(s): PCO2, PO2  Studies/Results: No results found.  Anti-infectives: Anti-infectives (From admission, onward)    Start     Dose/Rate Route Frequency Ordered Stop   02/26/21 0930  ceFAZolin (ANCEF) IVPB 2g/100 mL premix        2 g 200 mL/hr over 30 Minutes Intravenous On call to O.R. 02/26/21 0922 02/26/21 1115       Assessment/Plan:  Gallbladder adenocarcinoma s/p open partial central hepatectomy and portal lymph node dissection 02/26/21 -Try full liquids today -Mobilize -Will continue IVF/Foley for this morning as UOP is not recorded, clinically no concern for bleeding but may still be a little dry given urine  appearance and HR   LOS: 1 day    Clovis Riley 02/27/2021

## 2021-02-27 NOTE — Evaluation (Signed)
Physical Therapy Evaluation Patient Details Name: Shelby Cunningham MRN: [id number] DOB: 03-Jul-1985 Today's Date: 02/27/2021  History of Present Illness  Pt. 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  PMH: aneurysm, gallbladder CA, TIA.  Clinical Impression  Pt was previously independent with all ADLs and functional mobility.  S/p hepatectomy, pt is requiring CGA-min A for functional mobility with IV pole for support.  Additionally gait distance is limited due to fatigue and nausea.  Pt demos dec activity tolerance, gait deviations, dec balance and would benefit from skilled PT to address deficits indicated in initial eval.  Pt is safe to transition home with assist from family.     Recommendations for follow up therapy are one component of a multi-disciplinary discharge planning process, led by the attending physician.  Recommendations may be updated based on patient status, additional functional criteria and insurance authorization.  Follow Up Recommendations No PT follow up    Assistance Recommended at Discharge Intermittent Supervision/Assistance  Patient can return home with the following  A little help with walking and/or transfers;A little help with bathing/dressing/bathroom;Assistance with cooking/housework;Assist for transportation    Equipment Recommendations  (Anticipating that pt will transition off IV pole easily and be able to amb without AD, however, PT to monitor for need for AD)  Recommendations for Other Services       Functional Status Assessment Patient has had a recent decline in their functional status and demonstrates the ability to make significant improvements in function in a reasonable and predictable amount of time.     Precautions / Restrictions Precautions Precautions: None      Mobility  Bed Mobility Overal bed mobility: Needs Assistance Bed Mobility: Sidelying to  Sit   Sidelying to sit: Min assist       General bed mobility comments: Min A to complete log roll to assist with abdominal comfort.  Able to scoot forward to EOB with independence.  C/o dizziness in sitting. Patient Response: Cooperative  Transfers Overall transfer level: Needs assistance Equipment used: None Transfers: Sit to/from Stand Sit to Stand: Supervision           General transfer comment: Complete sit > stand with supervision only.  VCs to maintain standing at EOB for period of time prior to attempting walking due to reports of dizziness.    Ambulation/Gait Ambulation/Gait assistance: Supervision Gait Distance (Feet): 20 Feet Assistive device: IV Pole         General Gait Details: Amb in room with slow cadence while pushing IV pole for inc support.  C/o fatigue and nausea with activity and wants to sit in chair.  Stairs            Wheelchair Mobility    Modified Rankin (Stroke Patients Only)       Balance Overall balance assessment: No apparent balance deficits (not formally assessed)                                           Pertinent Vitals/Pain Pain Assessment Pain Assessment: 0-10 Pain Score: 2  Pain Location: Abdomin Pain Descriptors / Indicators: Aching, Discomfort, Tender Pain Intervention(s): Limited activity within patient's tolerance, Monitored during session, Repositioned    Home Living Family/patient expects to be discharged to:: Private residence Living Arrangements: Parent;Other relatives (Planning to stay at [place] during  recovery) Available Help at Discharge: Family;Available 24 hours/day (Mom and sister will be helping patient) Type of Home: House Home Access: Level entry       Home Layout: One level Home Equipment: None      Prior Function Prior Level of Function : Independent/Modified Independent                     Hand Dominance        Extremity/Trunk Assessment   Upper  Extremity Assessment Upper Extremity Assessment: Overall WFL for tasks assessed    Lower Extremity Assessment Lower Extremity Assessment: Overall WFL for tasks assessed       Communication   Communication: No difficulties  Cognition Arousal/Alertness: Awake/alert Behavior During Therapy: WFL for tasks assessed/performed Overall Cognitive Status: Within Functional Limits for tasks assessed                                 General Comments: Pt. is sleeping when PT arrives but arouses easily and is agreeable to try to get to chair.  Alert and oriented x 3.        General Comments      Exercises     Assessment/Plan    PT Assessment Patient needs continued PT services  PT Problem List Decreased mobility;Decreased activity tolerance;Pain       PT Treatment Interventions DME instruction;Therapeutic exercise;Gait training;Balance training;Functional mobility training;Therapeutic activities;Patient/family education    PT Goals (Current goals can be found in the Care Plan section)  Acute Rehab PT Goals Patient Stated Goal: Pt's goal is to return to independence PT Goal Formulation: With patient Time For Goal Achievement: 02/27/21 Potential to Achieve Goals: Good    Frequency Min 3X/week     Co-evaluation               AM-PAC PT "6 Clicks" Mobility  Outcome Measure Help needed turning from your back to your side while in a flat bed without using bedrails?: None Help needed moving from lying on your back to sitting on the side of a flat bed without using bedrails?: A Little Help needed moving to and from a bed to a chair (including a wheelchair)?: A Little Help needed standing up from a chair using your arms (e.g., wheelchair or bedside chair)?: A Little Help needed to walk in hospital room?: A Little Help needed climbing 3-5 steps with a railing? : A Little 6 Click Score: 19    End of Session Equipment Utilized During Treatment: Gait belt Activity  Tolerance: Patient tolerated treatment well;Patient limited by fatigue Patient left: in chair;with call bell/phone within reach   PT Visit Diagnosis: Other abnormalities of gait and mobility (R26.89)    Time: 3491-7915 PT Time Calculation (min) (ACUTE ONLY): 18 min   Charges:   PT Evaluation $PT Eval Low Complexity: 1 Low          Jerrard Bradburn A. Krystena Reitter, PT, DPT Acute Rehabilitation Services Office: [place] 02/27/2021, 11:39 AM

## 2021-02-28 LAB — COMPREHENSIVE METABOLIC PANEL
ALT: 221 U/L — ABNORMAL HIGH (ref 0–44)
AST: 128 U/L — ABNORMAL HIGH (ref 15–41)
Albumin: 3 g/dL — ABNORMAL LOW (ref 3.5–5.0)
Alkaline Phosphatase: 81 U/L (ref 38–126)
Anion gap: 7 (ref 5–15)
BUN: 5 mg/dL — ABNORMAL LOW (ref 6–20)
CO2: 25 mmol/L (ref 22–32)
Calcium: 8.5 mg/dL — ABNORMAL LOW (ref 8.9–10.3)
Chloride: 108 mmol/L (ref 98–111)
Creatinine, Ser: 0.67 mg/dL (ref 0.44–1.00)
GFR, Estimated: >60 mL/min (ref >60)
Glucose, Bld: 93 mg/dL (ref 70–99)
Potassium: 3.5 mmol/L (ref 3.5–5.1)
Sodium: 140 mmol/L (ref 135–145)
Total Bilirubin: 0.3 mg/dL (ref 0.3–1.2)
Total Protein: 5.6 g/dL — ABNORMAL LOW (ref 6.5–8.1)

## 2021-02-28 LAB — CBC
HCT: 35.5 % — ABNORMAL LOW (ref 36.0–46.0)
Hemoglobin: 11.5 g/dL — ABNORMAL LOW (ref 12.0–15.0)
MCH: 27.6 pg (ref 26.0–34.0)
MCHC: 32.4 g/dL (ref 30.0–36.0)
MCV: 85.1 fL (ref 80.0–100.0)
Platelets: 229 10*3/uL (ref 150–400)
RBC: 4.17 MIL/uL (ref 3.87–5.11)
RDW: 12.6 % (ref 11.5–15.5)
WBC: 12.8 10*3/uL — ABNORMAL HIGH (ref 4.0–10.5)
nRBC: 0 % (ref 0.0–0.2)

## 2021-02-28 LAB — MAGNESIUM: Magnesium: 1.9 mg/dL (ref 1.7–2.4)

## 2021-02-28 NOTE — Plan of Care (Signed)
°  Problem: Education: Goal: Knowledge of General Education information will improve Description: Including pain rating scale, medication(s)/side effects and non-pharmacologic comfort measures Outcome: Progressing   Problem: Health Behavior/Discharge Planning: Goal: Ability to manage health-related needs will improve Outcome: Progressing   Problem: Nutrition: Goal: Adequate nutrition will be maintained Outcome: Progressing   Problem: Coping: Goal: Level of anxiety will decrease Outcome: Progressing   Problem: Elimination: Goal: Will not experience complications related to bowel motility Outcome: Progressing Goal: Will not experience complications related to urinary retention Outcome: Progressing   Problem: Pain Managment: Goal: General experience of comfort will improve Outcome: Progressing   Problem: Safety: Goal: Ability to remain free from injury will improve Outcome: Progressing   

## 2021-02-28 NOTE — Progress Notes (Signed)
2 Days Post-Op   Subjective/Chief Complaint: Doing well post op.  Passed some flatus this morning.  No bowel movement yet.  No pain while sitting still in chair at bedside.  Tolerating liquids, hungry for more.   Objective: Vital signs in last 24 hours: Temp:  [97.8 F (36.6 C)-98.6 F (37 C)] 98.2 F (36.8 C) (02/12 0912) Pulse Rate:  [74-87] 81 (02/12 0912) Resp:  [17-18] 17 (02/12 0912) BP: (107-118)/(68-79) 118/74 (02/12 0912) SpO2:  [97 %-100 %] 100 % (02/12 0912)    Intake/Output from previous day: 02/11 0701 - 02/12 0700 In: 3277.1 [P.O.:390; I.V.:2887.1] Out: 2464 [Urine:2425; Drains:39] Intake/Output this shift: No intake/output data recorded.  Alert, pleasant Unlabored respirations Abdomen soft, nondistended, appropriately tender. Incision c/d/I no cellulitis or hematoma. JP sanguinoserous, low volume Foley in place with dark yellow urine  Lab Results:  Recent Labs    02/27/21 0237 02/28/21 0146  WBC 15.0* 12.8*  HGB 12.9 11.5*  HCT 38.8 35.5*  PLT 284 229    BMET Recent Labs    02/27/21 0237 02/28/21 0146  NA 138 140  K 3.9 3.5  CL 104 108  CO2 24 25  GLUCOSE 128* 93  BUN 8 5*  CREATININE 0.71 0.67  CALCIUM 8.7* 8.5*    PT/INR No results for input(s): LABPROT, INR in the last 72 hours. ABG No results for input(s): PHART, HCO3 in the last 72 hours.  Invalid input(s): PCO2, PO2  Studies/Results: No results found.  Anti-infectives: Anti-infectives (From admission, onward)    Start     Dose/Rate Route Frequency Ordered Stop   02/26/21 0930  ceFAZolin (ANCEF) IVPB 2g/100 mL premix        2 g 200 mL/hr over 30 Minutes Intravenous On call to O.R. 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

## 2021-03-01 LAB — COMPREHENSIVE METABOLIC PANEL
ALT: 174 U/L — ABNORMAL HIGH (ref 0–44)
AST: 74 U/L — ABNORMAL HIGH (ref 15–41)
Albumin: 3.1 g/dL — ABNORMAL LOW (ref 3.5–5.0)
Alkaline Phosphatase: 87 U/L (ref 38–126)
Anion gap: 6 (ref 5–15)
BUN: 7 mg/dL (ref 6–20)
CO2: 25 mmol/L (ref 22–32)
Calcium: 8.5 mg/dL — ABNORMAL LOW (ref 8.9–10.3)
Chloride: 108 mmol/L (ref 98–111)
Creatinine, Ser: 0.61 mg/dL (ref 0.44–1.00)
GFR, Estimated: >60 mL/min (ref >60)
Glucose, Bld: 96 mg/dL (ref 70–99)
Potassium: 3.5 mmol/L (ref 3.5–5.1)
Sodium: 139 mmol/L (ref 135–145)
Total Bilirubin: 0.2 mg/dL — ABNORMAL LOW (ref 0.3–1.2)
Total Protein: 6 g/dL — ABNORMAL LOW (ref 6.5–8.1)

## 2021-03-01 LAB — CBC
HCT: 36.6 % (ref 36.0–46.0)
Hemoglobin: 11.9 g/dL — ABNORMAL LOW (ref 12.0–15.0)
MCH: 27.5 pg (ref 26.0–34.0)
MCHC: 32.5 g/dL (ref 30.0–36.0)
MCV: 84.7 fL (ref 80.0–100.0)
Platelets: 237 10*3/uL (ref 150–400)
RBC: 4.32 MIL/uL (ref 3.87–5.11)
RDW: 12.6 % (ref 11.5–15.5)
WBC: 13 10*3/uL — ABNORMAL HIGH (ref 4.0–10.5)
nRBC: 0 % (ref 0.0–0.2)

## 2021-03-01 LAB — SURGICAL PATHOLOGY

## 2021-03-01 MED ORDER — POLYETHYLENE GLYCOL 3350 17 G PO PACK: 17.0000 g | PACK | Freq: Every day | ORAL | 0 refills | Status: DC | PRN

## 2021-03-01 MED ORDER — DOCUSATE SODIUM 100 MG PO CAPS: 100.0000 mg | ORAL_CAPSULE | Freq: Two times a day (BID) | ORAL | 0 refills | Status: DC

## 2021-03-01 MED ORDER — TRAMADOL HCL 50 MG PO TABS: 50.0000 mg | ORAL_TABLET | Freq: Four times a day (QID) | ORAL | 0 refills | Status: DC | PRN

## 2021-03-01 MED ORDER — ONDANSETRON 4 MG PO TBDP: 4.0000 mg | ORAL_TABLET | Freq: Four times a day (QID) | ORAL | 0 refills | Status: DC | PRN

## 2021-03-01 MED ORDER — TRAMADOL HCL 50 MG PO TABS: 50.0000 mg | ORAL_TABLET | Freq: Four times a day (QID) | ORAL | Status: DC | PRN

## 2021-03-01 MED ORDER — ACETAMINOPHEN 500 MG PO TABS: 1000.0000 mg | ORAL_TABLET | Freq: Three times a day (TID) | ORAL | 0 refills | Status: DC | PRN

## 2021-03-01 MED ORDER — METHOCARBAMOL 500 MG PO TABS: 500.0000 mg | ORAL_TABLET | Freq: Four times a day (QID) | ORAL | 0 refills | Status: DC | PRN

## 2021-03-01 NOTE — Progress Notes (Signed)
Patient discharged home in stable condition. Verbalizes understanding of all discharge instructions, including home medications and follow up appointments. 

## 2021-03-01 NOTE — Plan of Care (Signed)
°  Problem: Health Behavior/Discharge Planning: Goal: Ability to manage health-related needs will improve 03/01/2021 0318 by Lydia Guiles, RN Outcome: Progressing 03/01/2021 0317 by Lydia Guiles, RN Outcome: Progressing   Problem: Clinical Measurements: Goal: Respiratory complications will improve 03/01/2021 0318 by Lydia Guiles, RN Outcome: Progressing 03/01/2021 0317 by Lydia Guiles, RN Outcome: Progressing   Problem: Activity: Goal: Risk for activity intolerance will decrease 03/01/2021 0318 by Lydia Guiles, RN Outcome: Progressing 03/01/2021 0317 by Lydia Guiles, RN Outcome: Progressing   Problem: Nutrition: Goal: Adequate nutrition will be maintained 03/01/2021 0318 by Lydia Guiles, RN Outcome: Progressing 03/01/2021 0317 by Lydia Guiles, RN Outcome: Progressing   Problem: Coping: Goal: Level of anxiety will decrease 03/01/2021 0318 by Lydia Guiles, RN Outcome: Progressing 03/01/2021 0317 by Lydia Guiles, RN Outcome: Progressing   Problem: Elimination: Goal: Will not experience complications related to bowel motility 03/01/2021 0318 by Lydia Guiles, RN Outcome: Progressing 03/01/2021 0317 by Lydia Guiles, RN Outcome: Progressing   Problem: Elimination: Goal: Will not experience complications related to urinary retention 03/01/2021 0318 by Lydia Guiles, RN Outcome: Progressing 03/01/2021 0317 by Lydia Guiles, RN Outcome: Progressing   Problem: Pain Managment: Goal: General experience of comfort will improve 03/01/2021 0318 by Lydia Guiles, RN Outcome: Progressing 03/01/2021 0317 by Lydia Guiles, RN Outcome: Progressing   Problem: Safety: Goal: Ability to remain free from injury will improve 03/01/2021 0318 by Lydia Guiles, RN Outcome: Progressing 03/01/2021 0317 by Lydia Guiles, RN Outcome: Progressing

## 2021-03-01 NOTE — Progress Notes (Signed)
Pain controlled, tolerating diet, passing flatus. Drain serosanguinous. Patient would like to go home today. JP drain removed. Will discharge home, outpatient follow up scheduled in 3 weeks.

## 2021-03-01 NOTE — Discharge Instructions (Addendum)
[facility] DISCHARGE INSTRUCTIONS  Activity No heavy lifting greater than 15 pounds for 6 weeks after surgery. Ok to shower, but do not bathe or submerge incisions underwater. Do not drive while taking narcotic pain medication.  Wound Care Your incision is covered with skin glue called Dermabond. This will peel off on its own over time. You may shower and allow warm soapy water to run over your incision. Gently pat dry. Do not submerge your incision underwater. Monitor your incision for any new redness, tenderness, or drainage. Your drain site will close on its own within a few days. Once this closes over, you do not need to keep a dressing on the area.  When to Call us: Fever greater than 100.5 New redness, drainage, or swelling at incision site Severe pain, nausea, or vomiting Jaundice (yellowing of the whites of the eyes or skin)  Follow-up You have an appointment scheduled with Dr. Zenia Resides on March 23, 2021 at 10:20am. This will be at the [facility] Surgery office at [address] [place], [place], [place]. Please arrive at least 15 minutes prior to your scheduled appointment time.  For questions or concerns, please call the office at [phone].

## 2021-03-02 LAB — TYPE AND SCREEN
ABO/RH(D): O POS
Antibody Screen: NEGATIVE
Unit division: 0
Unit division: 0

## 2021-03-02 LAB — BPAM RBC
Blood Product Expiration Date: 202303112359
Blood Product Expiration Date: 202303112359
Unit Type and Rh: 5100
Unit Type and Rh: 5100

## 2021-03-02 NOTE — Discharge Summary (Signed)
Physician Discharge Summary   Patient ID: Shelby Cunningham [id number] 35 y.o. Jun 07, 1985  Admit date: 02/26/2021  Discharge date and time: 03/01/2021 10:24 AM   Admitting Physician: Dwan Bolt, MD   Discharge Physician: Michaelle Birks, MD  Admission Diagnoses: Gallbladder cancer [facility]) [C23]  Discharge Diagnoses: Gallbladder cancer  Admission Condition: good  Discharged Condition: good  Indication for Admission: 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 She was referred for oncologic resection and presented for surgery on 02/26/21.  Hospital Course: The patient was taken to the OR on 02/26/21 for a staging laparoscopy, open partial central hepatectomy and portal lymph node dissection. Please see separately dictated operative note for further details of this procedure. Postoperatively she was admitted to the med-surg floor in stable condition. She was started on a clear liquid diet. Her diet was slowly advanced over the next few days, which she tolerated with no nausea or vomiting. Foley was removed on POD2 and she was able to void. She was advance to a regular diet, which she tolerated. She was able to ambulate without difficulty. On the morning of POD3, her pain was controlled, she was tolerated a diet, and she was ambulating. Her surgical drain remained nonbilious and was removed. She was examined and deemed appropriate for discharge home.  Consults: None  Significant Diagnostic Studies: N/A  Treatments: analgesia: acetaminophen, Dilaudid, and oxycodone, therapies: PT, and surgery: staging laparoscopy, open partial central hepatectomy, portal lymph node dissection  Discharge Exam: General: resting comfortably, NAD Neuro: alert and oriented, no focal deficits HEENT: no scleral icterus Resp: normal work of breathing on room air Abdomen: soft,  nondistended, nontender to palpation. Midline incision clean and dry with no erythema or induration. Extremities: warm and well-perfused   Disposition: Discharge disposition: 01-Home or Self Care       Patient Instructions:  Allergies as of 03/01/2021   No Known Allergies      Medication List     TAKE these medications    acetaminophen 500 MG tablet Commonly known as: TYLENOL Take 2 tablets (1,000 mg total) by mouth every 8 (eight) hours as needed for mild pain.   capecitabine 500 MG tablet Commonly known as: XELODA Take 4 tablets (2,000 mg total) by mouth 2 (two) times daily after a meal. Take for 14 days then off for 7 days   docusate sodium 100 MG capsule Commonly known as: COLACE Take 1 capsule (100 mg total) by mouth 2 (two) times daily.   methocarbamol 500 MG tablet Commonly known as: ROBAXIN Take 1 tablet (500 mg total) by mouth every 6 (six) hours as needed for muscle spasms.   ondansetron 4 MG disintegrating tablet Commonly known as: ZOFRAN-ODT Take 1 tablet (4 mg total) by mouth every 6 (six) hours as needed for nausea.   polyethylene glycol 17 g packet Commonly known as: MIRALAX / GLYCOLAX Take 17 g by mouth daily as needed for mild constipation.   traMADol 50 MG tablet Commonly known as: ULTRAM Take 1 tablet (50 mg total) by mouth every 6 (six) hours as needed for severe pain.       Activity: ambulate in house, no driving for 2 weeks, and no heavy lifting for 6 weeks Diet: regular diet Wound Care: keep wound clean and dry  Follow-up with Dr. Zenia Resides on 03/23/21.  Signed: Dwan Bolt 03/02/2021 8:07 AM

## 2021-03-04 ENCOUNTER — Other Ambulatory Visit (HOSPITAL_COMMUNITY): Payer: Self-pay

## 2021-03-10 ENCOUNTER — Other Ambulatory Visit (HOSPITAL_COMMUNITY): Payer: Self-pay

## 2021-03-10 NOTE — Telephone Encounter (Signed)
Oral Chemotherapy Pharmacist Encounter   Spoke with patient today to follow up regarding patient's oral chemotherapy medication: Xeloda (capecitabine)  Patient's copay is $243.56, this is affordable for her as she likely is needing to meet out of pocket deductible. Fill will be set up to be picked up by patient on 03/19/21 at the [facility] after her MD appointment at the [facility] I will meet with patient in clinic that day for initial counseling for Xeloda after her appointment.   Patient knows to call the office with questions or concerns.  Leron Croak, PharmD, BCPS Hematology/Oncology Clinical Pharmacist [facility] [place] [phone] 03/10/2021 2:18 PM

## 2021-03-19 ENCOUNTER — Inpatient Hospital Stay (HOSPITAL_BASED_OUTPATIENT_CLINIC_OR_DEPARTMENT_OTHER): Payer: Managed Care, Other (non HMO) | Admitting: Hematology

## 2021-03-19 ENCOUNTER — Other Ambulatory Visit (HOSPITAL_COMMUNITY): Payer: Self-pay

## 2021-03-19 ENCOUNTER — Encounter: Payer: Self-pay | Admitting: Hematology

## 2021-03-19 ENCOUNTER — Other Ambulatory Visit: Payer: Self-pay

## 2021-03-19 ENCOUNTER — Inpatient Hospital Stay: Payer: Managed Care, Other (non HMO) | Attending: Nurse Practitioner

## 2021-03-19 VITALS — BP 127/84 | HR 87 | Temp 98.6°F | Resp 18 | Ht 66.0 in | Wt 194.4 lb

## 2021-03-19 DIAGNOSIS — C23 Malignant neoplasm of gallbladder: Secondary | ICD-10-CM | POA: Insufficient documentation

## 2021-03-19 DIAGNOSIS — Z87891 Personal history of nicotine dependence: Secondary | ICD-10-CM | POA: Insufficient documentation

## 2021-03-19 LAB — CBC WITH DIFFERENTIAL (CANCER CENTER ONLY)
Abs Immature Granulocytes: 0.03 10*3/uL (ref 0.00–0.07)
Basophils Absolute: 0.1 10*3/uL (ref 0.0–0.1)
Basophils Relative: 1 %
Eosinophils Absolute: 0.5 10*3/uL (ref 0.0–0.5)
Eosinophils Relative: 6 %
HCT: 39.7 % (ref 36.0–46.0)
Hemoglobin: 13.1 g/dL (ref 12.0–15.0)
Immature Granulocytes: 0 %
Lymphocytes Relative: 33 %
Lymphs Abs: 2.8 10*3/uL (ref 0.7–4.0)
MCH: 27 pg (ref 26.0–34.0)
MCHC: 33 g/dL (ref 30.0–36.0)
MCV: 81.7 fL (ref 80.0–100.0)
Monocytes Absolute: 0.5 10*3/uL (ref 0.1–1.0)
Monocytes Relative: 5 %
Neutro Abs: 4.6 10*3/uL (ref 1.7–7.7)
Neutrophils Relative %: 55 %
Platelet Count: 395 10*3/uL (ref 150–400)
RBC: 4.86 MIL/uL (ref 3.87–5.11)
RDW: 12 % (ref 11.5–15.5)
WBC Count: 8.4 10*3/uL (ref 4.0–10.5)
nRBC: 0 % (ref 0.0–0.2)

## 2021-03-19 LAB — CMP (CANCER CENTER ONLY)
ALT: 47 U/L — ABNORMAL HIGH (ref 0–44)
AST: 31 U/L (ref 15–41)
Albumin: 4.1 g/dL (ref 3.5–5.0)
Alkaline Phosphatase: 180 U/L — ABNORMAL HIGH (ref 38–126)
Anion gap: 7 (ref 5–15)
BUN: 8 mg/dL (ref 6–20)
CO2: 28 mmol/L (ref 22–32)
Calcium: 9.5 mg/dL (ref 8.9–10.3)
Chloride: 105 mmol/L (ref 98–111)
Creatinine: 0.72 mg/dL (ref 0.44–1.00)
GFR, Estimated: >60 mL/min (ref >60)
Glucose, Bld: 102 mg/dL — ABNORMAL HIGH (ref 70–99)
Potassium: 3.7 mmol/L (ref 3.5–5.1)
Sodium: 140 mmol/L (ref 135–145)
Total Bilirubin: 0.3 mg/dL (ref 0.3–1.2)
Total Protein: 7.4 g/dL (ref 6.5–8.1)

## 2021-03-19 LAB — GENETIC SCREENING ORDER

## 2021-03-19 NOTE — Progress Notes (Signed)
[place]   Telephone:([phone] Fax:([phone]) [phone]   Clinic Follow up Note   Patient Care Team: Patient, No Pcp Per (Inactive) as PCP - General ([place]) Michael Boston, MD as Consulting Physician (General Surgery) Dwan Bolt, MD as Consulting Physician (Surgical Oncology) Truitt Merle, MD as Consulting Physician (Hematology) Alla Feeling, NP as Nurse Practitioner (Nurse Practitioner) Royston Bake, RN as Oncology Nurse Navigator (Oncology)  Date of Service:  03/19/2021  CHIEF COMPLAINT: f/u of gallbladder cancer  CURRENT THERAPY:  PENDING Adjuvant Xeloda  ASSESSMENT & PLAN:  Shelby Cunningham is a 36 y.o. female with   1. 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 Work up showed cholecystitis. She had cholecystectomy on 01/14/21, pathology showed incidental finding of adenocarcinoma, margins and LNs clear.  -staging chest CT on 02/08/21 showed a nonspecific 9 mm right hilar lymph node. Otherwise, no metastatic disease. -abdomen MRI on 02/09/21 showed: hepatic steatosis, no liver lesions; 4 mm cystic lesion of tail of pancreas. -she underwent oncologic resection on 02/26/21 with Dr. Zenia Resides, pathology was negative for malignancy. I reviewed with her  -To reduce her recurrence risk, we recommend adjuvant Xeloda BID days 1-14 q21 days x6 months. We reviewed side effects again today. She will see Dr. Zenia Resides back on 03/23/21, then plan to start Xeloda on 03/29/21.   2. Brain aneurysm, 2012 -followed by neuro, needs to re-establish care    3. Social -married with 1 son, in day care (age 27). Spouse works outside of home -patient is independent with ADLs, drives  -I reviewed available resources including SW, chaplain, financial, transportation, if needed   4. Genetics  -Sister had ovarian cancer, s/p surgery no adjuvant treatment doing well on surveillance. Per  pt, her sister's genetic testing was negative.  -she is scheduled for genetic counseling and testing on 03/24/21.    PLAN: -f/u with Dr. Zenia Resides on 3/7 as scheduled -genetic testing and counseling on 3/8 -start Xeloda on 3/13, she met Wells Guiles today  -phone visit on 3/17 -lab and f/u on 3/29  -she prefers Wednesday morning appointments   No problem-specific Assessment & Plan notes found for this encounter.   SUMMARY OF ONCOLOGIC HISTORY: Oncology History  Adenocarcinoma of gallbladder ([place])  01/14/2021 Cancer Staging   Staging form: Gallbladder, AJCC 8th Edition - Pathologic stage from 01/14/2021: Stage IIA (pT2a, pN0, cM0) - Signed by Alla Feeling, NP on 02/17/2021 Total positive nodes: 0    01/14/2021 Imaging   ABD US IMPRESSION: Cholelithiasis with gallbladder wall thickening and positive sonographic Murphy sign suggesting acute cholecystitis. There is no pericholecystic fluid.   Mildly increased liver echogenicity likely reflects steatosis. There are 2 relative hypoechoic areas in the right hepatic lobe that may reflect fatty spurring or lesions. Recommend nonemergent evaluation with MRI   01/14/2021 Surgery   Lap cholecystectomy by Dr. Michael Boston    01/14/2021 Pathology Results   A. GALLBLADDER, CHOLECYSTECTOMY:  - Invasive well to moderately differentiated adenocarcinoma, 1.6 cm,  arising in association with a gallbladder adenoma with high-grade dysplasia  - Carcinoma invades beyond the muscularis propria but not into the serosal surface  - Resection margins are negative for carcinoma  - See oncology table   COMMENT:  About 10% of the tumor shows presence of a neuroendocrine component in the form of a high-grade neuroendocrine carcinoma.  Immunostains for synaptophysin, CD56, chromogranin and Ki-67 are confirmatory.    02/08/2021  Imaging   CT Chest IMPRESSION: 9 mm right hilar lymph node is nonspecific and may be reactive. Recommend attention on follow-up studies.  Otherwise, no evidence of metastatic disease in the chest.   02/09/2021 Imaging   MRI ABD IMPRESSION: 1. Expected postsurgical changes of cholecystectomy. Normal caliber common bile duct with no evidence choledocholithiasis. 2. Hepatic steatosis with areas of focal fatty sparing. No suspicious liver lesions. 3. Small cystic lesion of the tail of the pancreas measuring 4 mm. Recommend follow up pre and post contrast MRI/MRCP or pancreatic protocol CT in 1 year. This recommendation follows ACR consensus guidelines: Management of Incidental Pancreatic Cysts:    02/17/2021 Initial Diagnosis   Adenocarcinoma of gallbladder ([place])   02/26/2021 Pathology Results   FINAL MICROSCOPIC DIAGNOSIS:   A. LIVER, CYSTIC DUCT MARGIN, EXCISION:  - Fibrosis and histiocytic infiltrate consistent with previous  procedure.  - Negative for malignancy.   B. LIVER, PARTIAL CENTRAL, HEPATECTOMY:  - Liver with focal fibrosis, histiocytic infiltrate and giant cell  reaction consistent with previous procedure.  - Negative for malignancy.   C. LYMPH NODE, PORTAL, EXCISION:  - Lymph node negative for metastatic carcinoma.   COMMENT:  The cystic duct margin specimen shows fibrosis with histiocytic  infiltrates.  No residual carcinoma is identified.   The liver specimen consist mostly of benign liver and at the gallbladder bed there is fibrosis with histiocytic infiltrates and giant cell reaction consistent with previous cholecystectomy.  No residual carcinoma is identified.       INTERVAL HISTORY:  Shelby Cunningham is here for a follow up of gallbladder cancer. She was last seen by me on 02/17/21 in consultation with NP Lacie. She presents to the clinic alone. She reports a cough recently, likely from her son. She denies any other viral symptoms. She notes she still has some residual pain from surgery, as expected. She denies any abdominal tenderness or other pain. She reports some concern regarding medicine-- she  states she started a prenatal multivitamin and became sick after 3 days of taking it. She reports she experienced vomiting and was in bed. She notes she has fully recovered since stopping the vitamins.   All other systems were reviewed with the patient and are negative.  MEDICAL HISTORY:  Past Medical History:  Diagnosis Date   Aneurysm ([place])    Brain - has checked every 6 months   Cancer ([place])    Exercise-induced shortness of breath    "sometimes at the gym" (01/30/2012)   GERD (gastroesophageal reflux disease)    No pertinent past medical history    Status post vacuum-assisted vaginal delivery 11/29/2016   TIA (transient ischemic attack) 01/2012    SURGICAL HISTORY: Past Surgical History:  Procedure Laterality Date   CHOLECYSTECTOMY     LAPAROSCOPIC CHOLECYSTECTOMY SINGLE SITE WITH INTRAOPERATIVE CHOLANGIOGRAM N/A 01/14/2021   Procedure: LAPAROSCOPIC CHOLECYSTECTOMY SINGLE SITE;  Surgeon: Michael Boston, MD;  Location: [facility];  Service: General;  Laterality: N/A;   LAPAROSCOPY N/A 02/26/2021   Procedure: STAGING LAPAROSCOPY;  Surgeon: Dwan Bolt, MD;  Location: [facility];  Service: General;  Laterality: N/A;   OPEN HEPATECTOMY  N/A 02/26/2021   Procedure: OPEN PARTIAL CENTRAL HEPATECTOMY, PORTAL LYMPH NODE DISSECTION, INTRAOPERATIVE ULTRASOUND;  Surgeon: Dwan Bolt, MD;  Location: [facility] [facility];  Service: General;  Laterality: N/A;   TEE WITHOUT CARDIOVERSION  01/31/2012   Procedure: TRANSESOPHAGEAL ECHOCARDIOGRAM (TEE);  Surgeon: Lelon Perla, MD;  Location: [facility];  Service: Cardiovascular;  Laterality: N/A;  I have reviewed the social history and family history with the patient and they are unchanged from previous note.  ALLERGIES:  has No Known Allergies.  MEDICATIONS:  Current Outpatient Medications  Medication Sig Dispense Refill   acetaminophen (TYLENOL) 500 MG tablet Take 2 tablets (1,000 mg total) by mouth every 8 (eight) hours as needed for mild pain. 30  tablet 0   capecitabine (XELODA) 500 MG tablet Take 4 tablets (2,000 mg total) by mouth 2 (two) times daily after a meal. Take for 14 days then off for 7 days 112 tablet 0   docusate sodium (COLACE) 100 MG capsule Take 1 capsule (100 mg total) by mouth 2 (two) times daily. 28 capsule 0   methocarbamol (ROBAXIN) 500 MG tablet Take 1 tablet (500 mg total) by mouth every 6 (six) hours as needed for muscle spasms. 20 tablet 0   ondansetron (ZOFRAN-ODT) 4 MG disintegrating tablet Take 1 tablet (4 mg total) by mouth every 6 (six) hours as needed for nausea. 20 tablet 0   polyethylene glycol (MIRALAX / GLYCOLAX) 17 g packet Take 17 g by mouth daily as needed for mild constipation. 14 each 0   traMADol (ULTRAM) 50 MG tablet Take 1 tablet (50 mg total) by mouth every 6 (six) hours as needed for severe pain. 20 tablet 0   No current facility-administered medications for this visit.    PHYSICAL EXAMINATION: ECOG PERFORMANCE STATUS: 0 - Asymptomatic  Vitals:   03/19/21 1413  BP: 127/84  Pulse: 87  Resp: 18  Temp: 98.6 F (37 C)  SpO2: 99%   Wt Readings from Last 3 Encounters:  03/19/21 194 lb 6.4 oz (88.2 kg)  02/26/21 195 lb (88.5 kg)  02/23/21 192 lb 14.4 oz (87.5 kg)     GENERAL:alert, no distress and comfortable SKIN: skin color, texture, turgor are normal, no rashes or significant lesions EYES: normal, Conjunctiva are pink and non-injected, sclera clear ABDOMEN: abdomen soft, non-tender; incision healing well, some glue still present. NEURO: alert & oriented x 3 with fluent speech, no focal motor/sensory deficits  LABORATORY DATA:  I have reviewed the data as listed CBC Latest Ref Rng & Units 03/19/2021 03/01/2021 02/28/2021  WBC 4.0 - 10.5 K/uL 8.4 13.0(H) 12.8(H)  Hemoglobin 12.0 - 15.0 g/dL 13.1 11.9(L) 11.5(L)  Hematocrit 36.0 - 46.0 % 39.7 36.6 35.5(L)  Platelets 150 - 400 K/uL 395 237 229     CMP Latest Ref Rng & Units 03/19/2021 03/01/2021 02/28/2021  Glucose 70 - 99 mg/dL  102(H) 96 93  BUN 6 - 20 mg/dL 8 7 5(L)  Creatinine 0.44 - 1.00 mg/dL 0.72 0.61 0.67  Sodium 135 - 145 mmol/L 140 139 140  Potassium 3.5 - 5.1 mmol/L 3.7 3.5 3.5  Chloride 98 - 111 mmol/L 105 108 108  CO2 22 - 32 mmol/L '28 25 25  ' Calcium 8.9 - 10.3 mg/dL 9.5 8.5(L) 8.5(L)  Total Protein 6.5 - 8.1 g/dL 7.4 6.0(L) 5.6(L)  Total Bilirubin 0.3 - 1.2 mg/dL 0.3 0.2(L) 0.3  Alkaline Phos 38 - 126 U/L 180(H) 87 81  AST 15 - 41 U/L 31 74(H) 128(H)  ALT 0 - 44 U/L 47(H) 174(H) 221(H)      RADIOGRAPHIC STUDIES: I have personally reviewed the radiological images as listed and agreed with the findings in the report. No results found.    No orders of the defined types were placed in this encounter.  All questions were answered. The patient knows to call the clinic with any  problems, questions or concerns. No barriers to learning was detected. The total time spent in the appointment was 30 minutes.     Truitt Merle, MD 03/19/2021   I, Wilburn Mylar, am acting as scribe for Truitt Merle, MD.   I have reviewed the above documentation for accuracy and completeness, and I agree with the above.

## 2021-03-19 NOTE — Telephone Encounter (Signed)
Oral Chemotherapy Pharmacist Encounter ? ?I spoke with patient for overview of: Xeloda (capecitabine) for the adjuvant treatment of stage IIA gallbladder cancer, planned duration 6 months ? ?Counseled patient on administration, dosing, side effects, monitoring, drug-food interactions, safe handling, storage, and disposal. ? ?Patient will take Xeloda 500mg  tablets, 4 tablets (2000mg ) by mouth in AM and 4 tabs (2000mg ) by mouth in PM, within 30 minutes of finishing meals, for 14 days on, 7 days off, repeated every 21 days. ? ?Xeloda start date: 03/29/21 ? ?Adverse effects include but are not limited to: fatigue, decreased blood counts, GI upset, diarrhea, mouth sores, and hand-foot syndrome. ? ?Patient has anti-emetic on hand and knows to take it if nausea develops.   ?Patient will obtain anti diarrheal and alert the office of 4 or more loose stools above baseline. ? ?Reviewed with patient importance of keeping a medication schedule and plan for any missed doses. No barriers to medication adherence identified. ? ?Medication reconciliation performed and medication/allergy list updated. ? ?Patient will pick this up from the [facility] on 03/19/21. ? ?Patient informed the pharmacy will reach out 5-7 days prior to needing next fill of Xeloda to coordinate continued medication acquisition to prevent break in therapy. ? ?All questions answered. ? ?Ms. Mcmenamy voiced understanding and appreciation.  ? ?Medication education handout and calendar for 1st cycle given to patient. Patient knows to call the office with questions or concerns. Oral Chemotherapy Clinic phone number provided to patient.  ? ?Leron Croak, PharmD, BCPS ?Hematology/Oncology Clinical Pharmacist ?[facility] Oral Chemotherapy Navigation Clinics ?[phone] ?03/19/2021 2:48 PM ? ? ? ? ?

## 2021-03-20 LAB — CANCER ANTIGEN 19-9: CA 19-9: 20 U/mL (ref 0–35)

## 2021-03-23 ENCOUNTER — Telehealth: Payer: Self-pay | Admitting: Hematology

## 2021-03-23 NOTE — Telephone Encounter (Signed)
Scheduled follow-up appointments per 3/3 los. Patient is aware. 

## 2021-03-24 ENCOUNTER — Inpatient Hospital Stay: Payer: Managed Care, Other (non HMO) | Admitting: Genetic Counselor

## 2021-03-24 ENCOUNTER — Inpatient Hospital Stay: Payer: Managed Care, Other (non HMO)

## 2021-03-26 ENCOUNTER — Other Ambulatory Visit: Payer: Self-pay

## 2021-03-26 ENCOUNTER — Other Ambulatory Visit (HOSPITAL_COMMUNITY): Payer: Self-pay

## 2021-03-26 DIAGNOSIS — C23 Malignant neoplasm of gallbladder: Secondary | ICD-10-CM

## 2021-03-26 MED ORDER — ONDANSETRON 4 MG PO TBDP: 4.0000 mg | ORAL_TABLET | Freq: Four times a day (QID) | ORAL | 0 refills | Status: DC | PRN

## 2021-03-30 ENCOUNTER — Telehealth: Payer: Self-pay

## 2021-03-30 NOTE — Telephone Encounter (Signed)
This nurse reached out to this patient and advised that she had missed her Genetic Counseling appointment on 3/8.  Patient stated that she forgot and apologizes.  This nurse asked per provider if she would like testing to be completed on the blood sample that has already been draw.  Patient is in agreement with having testing completed.  No further questions or concerns at this time.   ?

## 2021-04-02 ENCOUNTER — Inpatient Hospital Stay (HOSPITAL_BASED_OUTPATIENT_CLINIC_OR_DEPARTMENT_OTHER): Payer: Managed Care, Other (non HMO) | Admitting: Hematology

## 2021-04-02 ENCOUNTER — Encounter: Payer: Self-pay | Admitting: Hematology

## 2021-04-02 DIAGNOSIS — C23 Malignant neoplasm of gallbladder: Secondary | ICD-10-CM

## 2021-04-02 NOTE — Progress Notes (Signed)
?[place]   ?[phone] [phone]) [phone]   ?Clinic Follow up Note  ? ?Patient Care Team: ?Patient, No Pcp Per (Inactive) as PCP - General (General Practice) ?Michael Boston, MD as Consulting Physician (General Surgery) ?Dwan Bolt, MD as Consulting Physician (Surgical Oncology) ?Truitt Merle, MD as Consulting Physician (Hematology) ?Alla Feeling, NP as Nurse Practitioner (Nurse Practitioner) ?Earl Gala, Deliah Goody, RN as Sales executive (Oncology) ? ?Date of Service:  04/02/2021 ? ?I connected with Shelby Cunningham on 04/02/2021 at  9:00 AM EDT by telephone visit and verified that I am speaking with the correct person using two identifiers.  ?I discussed the limitations, risks, security and privacy concerns of performing an evaluation and management service by telephone and the availability of in person appointments. I also discussed with the patient that there may be a patient responsible charge related to this service. The patient expressed understanding and agreed to proceed.  ? ?Other persons participating in the visit and their role in the encounter:  none ? ?Patient's location:  home ?Provider's location:  my office ? ?CHIEF COMPLAINT: f/u of gallbladder cancer ? ?CURRENT THERAPY:  ?Xeloda, 2017m BID days 1-14 q21days, started 03/29/21 ? ?ASSESSMENT & PLAN:  ?RKateleen Encarnacionis a 36y.o. female with  ? ?1. 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 Work up showed cholecystitis. She had cholecystectomy on 01/14/21, pathology showed incidental finding of adenocarcinoma, margins and LNs clear.  ?-staging chest CT on 02/08/21 showed a nonspecific 9 mm right hilar lymph node. Otherwise, no metastatic disease. ?-abdomen MRI on 02/09/21 showed: hepatic steatosis, no liver lesions; 4 mm cystic lesion of tail of pancreas. ?-she underwent oncologic resection on 02/26/21 with Dr. AZenia Resides  pathology was negative for malignancy.  ?-she began Xeloda, 20067mBID days 1-14 q21 days x6 months, on 03/29/21. She has tolerated well so far. She had one day of nausea and vomiting that has resolved with no recurrence. She has noted only mild skin changes thus far. She knows what to watch for. ?  ?2. Brain aneurysm, 2012 ?-followed by neuro, needs to re-establish care  ?  ?3. Social ?-married with 1 son, in day care (age 46)65 Spouse works outside of home ?-patient is independent with ADLs, drives  ?-I reviewed available resources including SW, chaplain, financial, transportation, if needed ?  ?4. Genetics  ?-Sister had ovarian cancer, s/p surgery no adjuvant treatment doing well on surveillance. Per pt, her sister's genetic testing was negative.  ?-she missed her genetic counseling appointment; this is being rescheduled. Her testing was added on to 03/19/21 labs. ?  ?  ?PLAN: ?-continue Cycle 1 Xeloda 2000 mg BID through 3/26 ?-lab and f/u on 3/29 ?            -she prefers Wednesday morning appointments ? ? ?No problem-specific Assessment & Plan notes found for this encounter. ? ? ? ?SUMMARY OF ONCOLOGIC HISTORY: ?Oncology History  ?Adenocarcinoma of gallbladder ([place] ?01/14/2021 Cancer Staging  ? Staging form: Gallbladder, AJCC 8th Edition ?- Pathologic stage from 01/14/2021: Stage IIA (pT2a, pN0, cM0) - Signed by BuAlla FeelingNP on 02/17/2021 ?Total positive nodes: 0 ? ?  ?01/14/2021 Imaging  ? ABD [place]PRESSION: ?Cholelithiasis with gallbladder wall thickening and positive sonographic Murphy sign suggesting acute cholecystitis. There is no pericholecystic fluid. ?  ?Mildly increased liver echogenicity likely reflects steatosis. There are 2 relative hypoechoic areas in the right  hepatic lobe that may reflect fatty spurring or lesions. Recommend nonemergent evaluation with MRI ?  ?01/14/2021 Surgery  ? Lap cholecystectomy by Dr. Michael Boston  ?  ?01/14/2021 Pathology Results  ? A. GALLBLADDER, CHOLECYSTECTOMY:   ?- Invasive well to moderately differentiated adenocarcinoma, 1.6 cm,  arising in association with a gallbladder adenoma with high-grade dysplasia  ?- Carcinoma invades beyond the muscularis propria but not into the serosal surface  ?- Resection margins are negative for carcinoma  ?- See oncology table  ? ?COMMENT:  ?About 10% of the tumor shows presence of a neuroendocrine component in the form of a high-grade neuroendocrine carcinoma.  Immunostains for synaptophysin, CD56, chromogranin and Ki-67 are confirmatory.  ?  ?02/08/2021 Imaging  ? CT Chest IMPRESSION: ?9 mm right hilar lymph node is nonspecific and may be reactive. Recommend attention on follow-up studies. Otherwise, no evidence of metastatic disease in the chest. ?  ?02/09/2021 Imaging  ? MRI ABD IMPRESSION: ?1. Expected postsurgical changes of cholecystectomy. Normal caliber common bile duct with no evidence choledocholithiasis. ?2. Hepatic steatosis with areas of focal fatty sparing. No suspicious liver lesions. ?3. Small cystic lesion of the tail of the pancreas measuring 4 mm. Recommend follow up pre and post contrast MRI/MRCP or pancreatic protocol CT in 1 year. This recommendation follows ACR consensus guidelines: Management of Incidental Pancreatic Cysts:  ?  ?02/17/2021 Initial Diagnosis  ? Adenocarcinoma of gallbladder [facility]) ?  ?02/26/2021 Pathology Results  ? FINAL MICROSCOPIC DIAGNOSIS:  ? ?A. LIVER, CYSTIC DUCT MARGIN, EXCISION:  ?- Fibrosis and histiocytic infiltrate consistent with previous  ?procedure.  ?- Negative for malignancy.  ? ?B. LIVER, PARTIAL CENTRAL, HEPATECTOMY:  ?- Liver with focal fibrosis, histiocytic infiltrate and giant cell  ?reaction consistent with previous procedure.  ?- Negative for malignancy.  ? ?C. LYMPH NODE, PORTAL, EXCISION:  ?- Lymph node negative for metastatic carcinoma.  ? ?COMMENT:  ?The cystic duct margin specimen shows fibrosis with histiocytic  ?infiltrates.  No residual carcinoma is identified.  ? ?The  liver specimen consist mostly of benign liver and at the gallbladder bed there is fibrosis with histiocytic infiltrates and giant cell reaction consistent with previous cholecystectomy.  No residual carcinoma is identified.  ?  ? ? ? ?INTERVAL HISTORY:  ?Lakashia Collison was contacted for a follow up of gallbladder cancer. She was last seen by me on 03/19/21. ?She reports she is tolerating Xeloda very well overall. She notes she had nausea and vomiting on day 1 but this resolved and has not reoccurred. She notes some very minimal dryness and redness to her hands so far.  ?  ?All other systems were reviewed with the patient and are negative. ? ?MEDICAL HISTORY:  ?Past Medical History:  ?Diagnosis Date  ? Aneurysm ([place])   ? Brain - has checked every 6 months  ? Cancer [facility])   ? Exercise-induced shortness of breath   ? "sometimes at the gym" (01/30/2012)  ? GERD (gastroesophageal reflux disease)   ? No pertinent past medical history   ? Status post vacuum-assisted vaginal delivery 11/29/2016  ? TIA (transient ischemic attack) 01/2012  ? ? ?SURGICAL HISTORY: ?Past Surgical History:  ?Procedure Laterality Date  ? CHOLECYSTECTOMY    ? LAPAROSCOPIC CHOLECYSTECTOMY SINGLE SITE WITH INTRAOPERATIVE CHOLANGIOGRAM N/A 01/14/2021  ? Procedure: LAPAROSCOPIC CHOLECYSTECTOMY SINGLE SITE;  Surgeon: Michael Boston, MD;  Location: [facility];  Service: General;  Laterality: N/A;  ? LAPAROSCOPY N/A 02/26/2021  ? Procedure: STAGING LAPAROSCOPY;  Surgeon: Dwan Bolt, MD;  Location:  [facility];  Service: General;  Laterality: N/A;  ? OPEN HEPATECTOMY  N/A 02/26/2021  ? Procedure: OPEN PARTIAL CENTRAL HEPATECTOMY, PORTAL LYMPH NODE DISSECTION, INTRAOPERATIVE ULTRASOUND;  Surgeon: Dwan Bolt, MD;  Location: [facility];  Service: General;  Laterality: N/A;  ? TEE WITHOUT CARDIOVERSION  01/31/2012  ? Procedure: TRANSESOPHAGEAL ECHOCARDIOGRAM (TEE);  Surgeon: Lelon Perla, MD;  Location: [facility] END[facility]COPY;  Service: Cardiovascular;  Laterality: N/A;  ? ? ?I  have reviewed the social history and family history with the patient and they are unchanged from previous note. ? ?ALLERGIES:  has No Known Allergies. ? ?MEDICATIONS:  ?Current Outpatient Medications  ?Med

## 2021-04-05 ENCOUNTER — Other Ambulatory Visit (HOSPITAL_COMMUNITY): Payer: Self-pay

## 2021-04-06 ENCOUNTER — Other Ambulatory Visit (HOSPITAL_COMMUNITY): Payer: Self-pay

## 2021-04-06 ENCOUNTER — Other Ambulatory Visit: Payer: Self-pay | Admitting: Hematology

## 2021-04-06 MED ORDER — CAPECITABINE 500 MG PO TABS
1000.0000 mg/m2 | ORAL_TABLET | Freq: Two times a day (BID) | ORAL | 0 refills | Status: DC
  Filled 2021-04-09: qty 112, 14d supply, fill #0

## 2021-04-09 ENCOUNTER — Other Ambulatory Visit (HOSPITAL_COMMUNITY): Payer: Self-pay

## 2021-04-12 ENCOUNTER — Other Ambulatory Visit (HOSPITAL_COMMUNITY): Payer: Self-pay

## 2021-04-14 ENCOUNTER — Inpatient Hospital Stay: Payer: Managed Care, Other (non HMO)

## 2021-04-14 ENCOUNTER — Other Ambulatory Visit: Payer: Self-pay

## 2021-04-14 ENCOUNTER — Encounter: Payer: Self-pay | Admitting: Hematology

## 2021-04-14 ENCOUNTER — Inpatient Hospital Stay (HOSPITAL_BASED_OUTPATIENT_CLINIC_OR_DEPARTMENT_OTHER): Payer: Managed Care, Other (non HMO) | Admitting: Hematology

## 2021-04-14 VITALS — BP 136/86 | HR 89 | Temp 98.3°F | Resp 18 | Ht 66.0 in | Wt 191.0 lb

## 2021-04-14 DIAGNOSIS — C23 Malignant neoplasm of gallbladder: Secondary | ICD-10-CM | POA: Diagnosis not present

## 2021-04-14 LAB — CBC WITH DIFFERENTIAL (CANCER CENTER ONLY)
Abs Immature Granulocytes: 0.02 10*3/uL (ref 0.00–0.07)
Basophils Absolute: 0.1 10*3/uL (ref 0.0–0.1)
Basophils Relative: 1 %
Eosinophils Absolute: 0.3 10*3/uL (ref 0.0–0.5)
Eosinophils Relative: 3 %
HCT: 40.1 % (ref 36.0–46.0)
Hemoglobin: 13.5 g/dL (ref 12.0–15.0)
Immature Granulocytes: 0 %
Lymphocytes Relative: 32 %
Lymphs Abs: 2.8 10*3/uL (ref 0.7–4.0)
MCH: 27.3 pg (ref 26.0–34.0)
MCHC: 33.7 g/dL (ref 30.0–36.0)
MCV: 81 fL (ref 80.0–100.0)
Monocytes Absolute: 0.5 10*3/uL (ref 0.1–1.0)
Monocytes Relative: 6 %
Neutro Abs: 5.2 10*3/uL (ref 1.7–7.7)
Neutrophils Relative %: 58 %
Platelet Count: 333 10*3/uL (ref 150–400)
RBC: 4.95 MIL/uL (ref 3.87–5.11)
RDW: 13.5 % (ref 11.5–15.5)
WBC Count: 8.9 10*3/uL (ref 4.0–10.5)
nRBC: 0 % (ref 0.0–0.2)

## 2021-04-14 LAB — CMP (CANCER CENTER ONLY)
ALT: 38 U/L (ref 0–44)
AST: 35 U/L (ref 15–41)
Albumin: 4.3 g/dL (ref 3.5–5.0)
Alkaline Phosphatase: 109 U/L (ref 38–126)
Anion gap: 7 (ref 5–15)
BUN: 10 mg/dL (ref 6–20)
CO2: 23 mmol/L (ref 22–32)
Calcium: 9.1 mg/dL (ref 8.9–10.3)
Chloride: 107 mmol/L (ref 98–111)
Creatinine: 0.73 mg/dL (ref 0.44–1.00)
GFR, Estimated: >60 mL/min (ref >60)
Glucose, Bld: 96 mg/dL (ref 70–99)
Potassium: 3.5 mmol/L (ref 3.5–5.1)
Sodium: 137 mmol/L (ref 135–145)
Total Bilirubin: 0.3 mg/dL (ref 0.3–1.2)
Total Protein: 7.9 g/dL (ref 6.5–8.1)

## 2021-04-14 NOTE — Progress Notes (Signed)
?[place]   ?[phone] [phone]   ?Clinic Follow up Note  ? ?Patient Care Team: ?Patient, No Pcp Per (Inactive) as PCP - General (General Practice) ?Michael Boston, MD as Consulting Physician (General Surgery) ?Dwan Bolt, MD as Consulting Physician (Surgical Oncology) ?Truitt Merle, MD as Consulting Physician (Hematology) ?Alla Feeling, NP as Nurse Practitioner (Nurse Practitioner) ?Earl Gala, Deliah Goody, RN as Sales executive (Oncology) ? ?Date of Service:  04/14/2021 ? ?CHIEF COMPLAINT: f/u of gallbladder cancer ? ?CURRENT THERAPY:  ?Xeloda, 2036m BID days 1-14 q21days, started 03/29/21 ? ?ASSESSMENT & PLAN:  ?Shelby Cunningham a 36y.o. female with  ? ?1. 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 Work up showed cholecystitis. She had cholecystectomy on 01/14/21, pathology showed incidental finding of adenocarcinoma, margins and LNs clear.  ?-staging chest CT on 02/08/21 showed a nonspecific 9 mm right hilar lymph node. Otherwise, no metastatic disease. ?-abdomen MRI on 02/09/21 showed: hepatic steatosis, no liver lesions; 4 mm cystic lesion of tail of pancreas. ?-she underwent oncologic resection on 02/26/21 with Dr. AZenia Resides pathology was negative for malignancy.  ?-she began Xeloda, 20056mBID days 1-14 q21 days x6 months, on 03/29/21. She has tolerated well so far. She had one day of nausea and vomiting that has resolved with no recurrence. She has noted only mild skin changes thus far. She knows what to watch for. ?-labs reviewed, overall WNL ? ?2. Symptom Management: nausea with vomiting ?-she has zofran for nausea ?  ?3. Brain aneurysm, 2012 ?-followed by neuro, needs to re-establish care  ?  ?4. Social ?-married with 1 son, in day care (age [phone] Spouse works outside of home ?  ?5. Genetics  ?-Sister had ovarian cancer, s/p surgery no adjuvant treatment doing  well on surveillance. Per pt, her sister's genetic testing was negative.  ?-she missed her genetic counseling appointment; this is rescheduled for 05/05/21. Her testing was added on to 03/19/21 labs. ?  ?  ?PLAN: ?-start cycle 2 Xeloda 2000 mg BID on 04/19/21 for 14 days ?-lab and f/u in 3 weeks ?            -she prefers Wednesday morning appointments ? ? ?No problem-specific Assessment & Plan notes found for this encounter. ? ? ?SUMMARY OF ONCOLOGIC HISTORY: ?Oncology History  ?Adenocarcinoma of [place]bladder ([facility] ?01/14/2021 Cancer Staging  ? Staging form: Gallbladder, AJCC 8th Edition ?- Pathologic stage from 01/14/2021: Stage IIA (pT2a, pN0, cM0) - Signed by BuAlla FeelingNP on 02/17/2021 ?Total positive nodes: 0 ? ?  ?01/14/2021 Imaging  ? ABD [place]: ?Cholelithiasis with gallbladder wall thickening and positive sonographic Murphy sign suggesting acute cholecystitis. There is no pericholecystic fluid. ?  ?Mildly increased liver echogenicity likely reflects steatosis. There are 2 relative hypoechoic areas in the right hepatic lobe that may reflect fatty spurring or lesions. Recommend nonemergent evaluation with MRI ?  ?01/14/2021 Surgery  ? Lap cholecystectomy by Dr. StMichael Boston?  ?01/14/2021 Pathology Results  ? A. GALLBLADDER, CHOLECYSTECTOMY:  ?- Invasive well to moderately differentiated adenocarcinoma, 1.6 cm,  arising in association with a gallbladder adenoma with high-grade dysplasia  ?- Carcinoma invades beyond the muscularis propria but not into the serosal surface  ?- Resection margins are negative for carcinoma  ?- See oncology table  ? ?COMMENT:  ?About 10% of the tumor shows presence of a neuroendocrine component in the form of a  high-grade neuroendocrine carcinoma.  Immunostains for synaptophysin, CD56, chromogranin and Ki-67 are confirmatory.  ?  ?02/08/2021 Imaging  ? CT Chest IMPRESSION: ?9 mm right hilar lymph node is nonspecific and may be reactive. Recommend attention on follow-up  studies. Otherwise, no evidence of metastatic disease in the chest. ?  ?02/09/2021 Imaging  ? MRI ABD IMPRESSION: ?1. Expected postsurgical changes of cholecystectomy. Normal caliber common bile duct with no evidence choledocholithiasis. ?2. Hepatic steatosis with areas of focal fatty sparing. No suspicious liver lesions. ?3. Small cystic lesion of the tail of the pancreas measuring 4 mm. Recommend follow up pre and post contrast MRI/MRCP or pancreatic protocol CT in 1 year. This recommendation follows ACR consensus guidelines: Management of Incidental Pancreatic Cysts:  ?  ?02/17/2021 Initial Diagnosis  ? Adenocarcinoma of gallbladder [facility]) ?  ?02/26/2021 Pathology Results  ? FINAL MICROSCOPIC DIAGNOSIS:  ? ?A. LIVER, CYSTIC DUCT MARGIN, EXCISION:  ?- Fibrosis and histiocytic infiltrate consistent with previous  ?procedure.  ?- Negative for malignancy.  ? ?B. LIVER, PARTIAL CENTRAL, HEPATECTOMY:  ?- Liver with focal fibrosis, histiocytic infiltrate and giant cell  ?reaction consistent with previous procedure.  ?- Negative for malignancy.  ? ?C. LYMPH NODE, PORTAL, EXCISION:  ?- Lymph node negative for metastatic carcinoma.  ? ?COMMENT:  ?The cystic duct margin specimen shows fibrosis with histiocytic  ?infiltrates.  No residual carcinoma is identified.  ? ?The liver specimen consist mostly of benign liver and at the gallbladder bed there is fibrosis with histiocytic infiltrates and giant cell reaction consistent with previous cholecystectomy.  No residual carcinoma is identified.  ?  ? ? ? ?INTERVAL HISTORY:  ?Shelby Cunningham is here for a follow up of gallbladder cancer. She was last seen by me on 03/19/21 with phone visit in the interim. She presents to the clinic alone. ?She reports she had one night of nausea, but she felt better the following day after correcting her dose. ?  ?All other systems were reviewed with the patient and are negative. ? ?MEDICAL HISTORY:  ?Past Medical History:  ?Diagnosis Date  ? Aneurysm  ([place])   ? Brain - has checked every 6 months  ? Cancer [facility])   ? Exercise-induced shortness of breath   ? "sometimes at the gym" (01/30/2012)  ? GERD (gastroesophageal reflux disease)   ? No pertinent past medical history   ? Status post vacuum-assisted vaginal delivery 11/29/2016  ? TIA (transient ischemic attack) 01/2012  ? ? ?SURGICAL HISTORY: ?Past Surgical History:  ?Procedure Laterality Date  ? CHOLECYSTECTOMY    ? LAPAROSCOPIC CHOLECYSTECTOMY SINGLE SITE WITH INTRAOPERATIVE CHOLANGIOGRAM N/A 01/14/2021  ? Procedure: LAPAROSCOPIC CHOLECYSTECTOMY SINGLE SITE;  Surgeon: Michael Boston, MD;  Location: [facility];  Service: General;  Laterality: N/A;  ? LAPAROSCOPY N/A 02/26/2021  ? Procedure: STAGING LAPAROSCOPY;  Surgeon: Dwan Bolt, MD;  Location: [place];  Service: General;  Laterality: N/A;  ? OPEN HEPATECTOMY  N/A 02/26/2021  ? Procedure: OPEN PARTIAL CENTRAL HEPATECTOMY, PORTAL LYMPH NODE DISSECTION, INTRAOPERATIVE ULTRASOUND;  Surgeon: Dwan Bolt, MD;  Location: [place];  Service: General;  Laterality: N/A;  ? TEE WITHOUT CARDIOVERSION  01/31/2012  ? Procedure: TRANSESOPHAGEAL ECHOCARDIOGRAM (TEE);  Surgeon: Lelon Perla, MD;  Location: [facility] ENDOSCOPY;  Service: Cardiovascular;  Laterality: N/A;  ? ? ?I have reviewed the social history and family history with the patient and they are unchanged from previous note. ? ?ALLERGIES:  has No Known Allergies. ? ?MEDICATIONS:  ?Current Outpatient Medications  ?Medication Sig Dispense Refill  ?  acetaminophen (TYLENOL) 500 MG tablet Take 2 tablets (1,000 mg total) by mouth every 8 (eight) hours as needed for mild pain. 30 tablet 0  ? capecitabine (XELODA) 500 MG tablet Take 4 tablets (2,000 mg total) by mouth 2 (two) times daily after a meal. Take for 14 days then off for 7 days 112 tablet 0  ? docusate sodium (COLACE) 100 MG capsule Take 1 capsule (100 mg total) by mouth 2 (two) times daily. 28 capsule 0  ? methocarbamol (ROBAXIN) 500 MG tablet Take 1 tablet (500 mg  total) by mouth every 6 (six) hours as needed for muscle spasms. 20 tablet 0  ? ondansetron (ZOFRAN-ODT) 4 MG disintegrating tablet Take 1 tablet (4 mg total) by mouth every 6 (six) hours as needed for nausea.

## 2021-04-15 LAB — CANCER ANTIGEN 19-9: CA 19-9: 20 U/mL (ref 0–35)

## 2021-04-19 ENCOUNTER — Other Ambulatory Visit (HOSPITAL_COMMUNITY): Payer: Self-pay

## 2021-04-20 ENCOUNTER — Other Ambulatory Visit (HOSPITAL_COMMUNITY): Payer: Self-pay

## 2021-04-21 ENCOUNTER — Other Ambulatory Visit (HOSPITAL_COMMUNITY): Payer: Self-pay

## 2021-04-22 ENCOUNTER — Encounter: Payer: Self-pay | Admitting: Genetic Counselor

## 2021-04-22 DIAGNOSIS — Z1379 Encounter for other screening for genetic and chromosomal anomalies: Secondary | ICD-10-CM | POA: Insufficient documentation

## 2021-04-29 ENCOUNTER — Other Ambulatory Visit (HOSPITAL_COMMUNITY): Payer: Self-pay

## 2021-05-03 ENCOUNTER — Other Ambulatory Visit (HOSPITAL_COMMUNITY): Payer: Self-pay

## 2021-05-05 ENCOUNTER — Inpatient Hospital Stay: Payer: Commercial Managed Care - HMO | Admitting: Genetic Counselor

## 2021-05-05 ENCOUNTER — Other Ambulatory Visit (HOSPITAL_COMMUNITY): Payer: Self-pay

## 2021-05-05 ENCOUNTER — Inpatient Hospital Stay: Payer: Commercial Managed Care - HMO

## 2021-05-05 ENCOUNTER — Telehealth: Payer: Self-pay | Admitting: Hematology

## 2021-05-05 ENCOUNTER — Other Ambulatory Visit: Payer: Self-pay | Admitting: Hematology

## 2021-05-05 MED ORDER — CAPECITABINE 500 MG PO TABS
1000.0000 mg/m2 | ORAL_TABLET | Freq: Two times a day (BID) | ORAL | 0 refills | Status: DC
  Filled 2021-05-05: qty 112, 21d supply, fill #0

## 2021-05-05 NOTE — Telephone Encounter (Signed)
.  Called patient to schedule appointment per 4/19 inbasket, patient is aware of date and time.   ?

## 2021-05-10 ENCOUNTER — Telehealth: Payer: Self-pay | Admitting: Hematology

## 2021-05-10 NOTE — Telephone Encounter (Signed)
Per 4/24 in basket called and spoke to pt about appointments.  Pt confirmed appointments  ?

## 2021-05-11 ENCOUNTER — Inpatient Hospital Stay: Payer: Commercial Managed Care - HMO

## 2021-05-11 ENCOUNTER — Inpatient Hospital Stay: Payer: Commercial Managed Care - HMO | Admitting: Hematology

## 2021-05-13 ENCOUNTER — Other Ambulatory Visit (HOSPITAL_COMMUNITY): Payer: Self-pay

## 2021-05-21 ENCOUNTER — Other Ambulatory Visit: Payer: Self-pay

## 2021-05-21 ENCOUNTER — Inpatient Hospital Stay (HOSPITAL_BASED_OUTPATIENT_CLINIC_OR_DEPARTMENT_OTHER): Payer: Commercial Managed Care - HMO | Admitting: Hematology

## 2021-05-21 ENCOUNTER — Encounter: Payer: Self-pay | Admitting: Hematology

## 2021-05-21 ENCOUNTER — Inpatient Hospital Stay: Payer: Commercial Managed Care - HMO | Attending: Nurse Practitioner

## 2021-05-21 VITALS — BP 130/61 | HR 84 | Temp 98.2°F | Resp 18 | Wt 190.5 lb

## 2021-05-21 DIAGNOSIS — C23 Malignant neoplasm of gallbladder: Secondary | ICD-10-CM | POA: Diagnosis present

## 2021-05-21 LAB — CMP (CANCER CENTER ONLY)
ALT: 27 U/L (ref 0–44)
AST: 24 U/L (ref 15–41)
Albumin: 4.1 g/dL (ref 3.5–5.0)
Alkaline Phosphatase: 91 U/L (ref 38–126)
Anion gap: 7 (ref 5–15)
BUN: 9 mg/dL (ref 6–20)
CO2: 24 mmol/L (ref 22–32)
Calcium: 9.1 mg/dL (ref 8.9–10.3)
Chloride: 108 mmol/L (ref 98–111)
Creatinine: 0.74 mg/dL (ref 0.44–1.00)
GFR, Estimated: >60 mL/min (ref >60)
Glucose, Bld: 97 mg/dL (ref 70–99)
Potassium: 3.8 mmol/L (ref 3.5–5.1)
Sodium: 139 mmol/L (ref 135–145)
Total Bilirubin: 0.7 mg/dL (ref 0.3–1.2)
Total Protein: 7.6 g/dL (ref 6.5–8.1)

## 2021-05-21 LAB — CBC WITH DIFFERENTIAL (CANCER CENTER ONLY)
Abs Immature Granulocytes: 0.03 10*3/uL (ref 0.00–0.07)
Basophils Absolute: 0.1 10*3/uL (ref 0.0–0.1)
Basophils Relative: 1 %
Eosinophils Absolute: 0.3 10*3/uL (ref 0.0–0.5)
Eosinophils Relative: 4 %
HCT: 41 % (ref 36.0–46.0)
Hemoglobin: 13.8 g/dL (ref 12.0–15.0)
Immature Granulocytes: 0 %
Lymphocytes Relative: 35 %
Lymphs Abs: 2.5 10*3/uL (ref 0.7–4.0)
MCH: 27.9 pg (ref 26.0–34.0)
MCHC: 33.7 g/dL (ref 30.0–36.0)
MCV: 83 fL (ref 80.0–100.0)
Monocytes Absolute: 0.5 10*3/uL (ref 0.1–1.0)
Monocytes Relative: 6 %
Neutro Abs: 3.9 10*3/uL (ref 1.7–7.7)
Neutrophils Relative %: 54 %
Platelet Count: 275 10*3/uL (ref 150–400)
RBC: 4.94 MIL/uL (ref 3.87–5.11)
RDW: 15.9 % — ABNORMAL HIGH (ref 11.5–15.5)
WBC Count: 7.2 10*3/uL (ref 4.0–10.5)
nRBC: 0 % (ref 0.0–0.2)

## 2021-05-21 MED ORDER — CAPECITABINE 500 MG PO TABS: 1000.0000 mg/m2 | ORAL_TABLET | Freq: Two times a day (BID) | ORAL | 1 refills | Status: DC

## 2021-05-21 NOTE — Progress Notes (Addendum)
?[place]   ?[phone]) [phone] [phone]   ?Clinic Follow up Note  ? ?Patient Care Team: ?Patient, No Pcp Per (Inactive) as PCP - General (General Practice) ?Michael Boston, MD as Consulting Physician (General Surgery) ?Dwan Bolt, MD as Consulting Physician (Surgical Oncology) ?Truitt Merle, MD as Consulting Physician (Hematology) ?Alla Feeling, NP as Nurse Practitioner (Nurse Practitioner) ?Earl Gala, Deliah Goody, RN as Sales executive (Oncology) ? ?Date of Service:  05/21/2021 ? ?CHIEF COMPLAINT: f/u of gallbladder cancer ? ?CURRENT THERAPY:  ?Xeloda, 2070m BID days 1-14 q21days, started 03/29/21 ? ?ASSESSMENT & PLAN:  ?RLylee Corrowis a 36y.o. female with  ? ?1. 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 Work up showed cholecystitis. She had cholecystectomy on 01/14/21, pathology showed incidental finding of adenocarcinoma, margins and LNs clear.  ?-staging chest CT on 02/08/21 showed a nonspecific 9 mm right hilar lymph node. Otherwise, no metastatic disease. ?-abdomen MRI on 02/09/21 showed: hepatic steatosis, no liver lesions; 4 mm cystic lesion of tail of pancreas. ?-she underwent oncologic resection on 02/26/21 with Dr. AZenia Resides pathology was negative for malignancy.  ?-baseline CEA obtained on 03/19/21 was WNL. ?-she began Xeloda, 20014mBID days 1-14 q21 days x6 months, on 03/29/21. She has tolerated well with only mild skin changes thus far. She knows what to watch for. ?-labs reviewed, overall WNL, will continue Xeloda. ?  ?2. Symptom Management: nausea with vomiting ?-she has zofran for nausea ?  ?3. Brain aneurysm, 2012 ?-followed by neuro, needs to re-establish care  ?  ?4. Social ?-married with 1 son, in day care (age 34)[phone] Spouse works outside of home ?  ?5. Genetics  ?-Sister had ovarian cancer, s/p surgery no adjuvant treatment doing well on surveillance. Per pt,  her sister's genetic testing was negative.  ?-genetic testing lab was added on to 03/19/21 labs. Results were negative, with VUS in ATM and DICER1. She is scheduled for full discussion with our counselor on 06/02/21. ?  ?  ?PLAN: ?-continue Xeloda ?-lab and f/u in 4 weeks  ?            -she prefers Wednesday morning appointments ? ? ?No problem-specific Assessment & Plan notes found for this encounter. ? ? ?SUMMARY OF ONCOLOGIC HISTORY: ?Oncology History  ?[place]adder ([facility] ?01/14/2021 Cancer Staging  ? Staging form: Gallbladder, AJCC 8th Edition ?- Pathologic stage from 01/14/2021: Stage IIA (pT2a, pN0, cM0) - Signed by BuAlla FeelingNP on 02/17/2021 ?Total positive nodes: 0 ? ?  ?01/14/2021 Imaging  ? ABD [place]PRESSION: ?Cholelithiasis with gallbladder wall thickening and positive sonographic Murphy sign suggesting acute cholecystitis. There is no pericholecystic fluid. ?  ?Mildly increased liver echogenicity likely reflects steatosis. There are 2 relative hypoechoic areas in the right hepatic lobe that may reflect fatty spurring or lesions. Recommend nonemergent evaluation with MRI ?  ?01/14/2021 Surgery  ? Lap cholecystectomy by Dr. StMichael Boston?  ?01/14/2021 Pathology Results  ? A. GALLBLADDER, CHOLECYSTECTOMY:  ?- Invasive well to moderately differentiated adenocarcinoma, 1.6 cm,  arising in association with a gallbladder adenoma with high-grade dysplasia  ?- Carcinoma invades beyond the muscularis propria but not into the serosal surface  ?- Resection margins are negative for carcinoma  ?- See oncology table  ? ?COMMENT:  ?About 10% of the tumor shows presence of a neuroendocrine component in the form of a high-grade neuroendocrine carcinoma.  Immunostains for  synaptophysin, CD56, chromogranin and Ki-67 are confirmatory.  ?  ?02/08/2021 Imaging  ? CT Chest IMPRESSION: ?9 mm right hilar lymph node is nonspecific and may be reactive. Recommend attention on follow-up studies. Otherwise, no  evidence of metastatic disease in the chest. ?  ?02/09/2021 Imaging  ? MRI ABD IMPRESSION: ?1. Expected postsurgical changes of cholecystectomy. Normal caliber common bile duct with no evidence choledocholithiasis. ?2. Hepatic steatosis with areas of focal fatty sparing. No suspicious liver lesions. ?3. Small cystic lesion of the tail of the pancreas measuring 4 mm. Recommend follow up pre and post contrast MRI/MRCP or pancreatic protocol CT in 1 year. This recommendation follows ACR consensus guidelines: Management of Incidental Pancreatic Cysts:  ?  ?02/17/2021 Initial Diagnosis  ? Adenocarcinoma of gallbladder ([place]) ? ?  ?02/26/2021 Pathology Results  ? FINAL MICROSCOPIC DIAGNOSIS:  ? ?A. LIVER, CYSTIC DUCT MARGIN, EXCISION:  ?- Fibrosis and histiocytic infiltrate consistent with previous  ?procedure.  ?- Negative for malignancy.  ? ?B. LIVER, PARTIAL CENTRAL, HEPATECTOMY:  ?- Liver with focal fibrosis, histiocytic infiltrate and giant cell  ?reaction consistent with previous procedure.  ?- Negative for malignancy.  ? ?C. LYMPH NODE, PORTAL, EXCISION:  ?- Lymph node negative for metastatic carcinoma.  ? ?COMMENT:  ?The cystic duct margin specimen shows fibrosis with histiocytic  ?infiltrates.  No residual carcinoma is identified.  ? ?The liver specimen consist mostly of benign liver and at the gallbladder bed there is fibrosis with histiocytic infiltrates and giant cell reaction consistent with previous cholecystectomy.  No residual carcinoma is identified.  ?  ?04/22/2021 Genetic Testing  ? Negative genetic testing on the CancerNext-Expanded+RNAinsight.  ATM [id number] and DICER1 [id number] VUS were identified.  The report date is Aprili 6, 2023. ? ?The CancerNext-Expanded gene panel offered by [facility] and includes sequencing and rearrangement analysis for the following 77 genes: AIP, ALK, APC*, ATM*, AXIN2, BAP1, BARD1, BLM, BMPR1A, BRCA1*, BRCA2*, BRIP1*, CDC73, CDH1*, CDK4, CDKN1B, CDKN2A, CHEK2*,  CTNNA1, DICER1, FANCC, FH, FLCN, GALNT12, KIF1B, LZTR1, MAX, MEN1, MET, MLH1*, MSH2*, MSH3, MSH6*, MUTYH*, NBN, NF1*, NF2, NTHL1, PALB2*, PHOX2B, PMS2*, POT1, PRKAR1A, PTCH1, PTEN*, RAD51C*, RAD51D*, RB1, RECQL, RET, SDHA, SDHAF2, SDHB, SDHC, SDHD, SMAD4, SMARCA4, SMARCB1, SMARCE1, STK11, SUFU, TMEM127, TP53*, TSC1, TSC2, VHL and XRCC2 (sequencing and deletion/duplication); EGFR, EGLN1, HOXB13, KIT, MITF, PDGFRA, POLD1, and POLE (sequencing only); EPCAM and GREM1 (deletion/duplication only). DNA and RNA analyses performed for * genes.  ?  ? ? ? ?INTERVAL HISTORY:  ?Reality Dejonge is here for a follow up of gallbladder cancer. She was last seen by me on 04/14/21. She presents to the clinic alone. ?She reports she is doing well overall except for issues sleeping. She tells me her son is sleeping in bed with her due to recent nightmares. She assures me she is doing well otherwise. She notes some skin darkness but denies peeling or breakdown. ?  ?All other systems were reviewed with the patient and are negative. ? ?MEDICAL HISTORY:  ?Past Medical History:  ?Diagnosis Date  ? Aneurysm ([place])   ? Brain - has checked every 6 months  ? Cancer [facility])   ? Exercise-induced shortness of breath   ? "sometimes at the gym" (01/30/2012)  ? GERD (gastroesophageal reflux disease)   ? No pertinent past medical history   ? Status post vacuum-assisted vaginal delivery 11/29/2016  ? TIA (transient ischemic attack) 01/2012  ? ? ?SURGICAL HISTORY: ?Past Surgical History:  ?Procedure Laterality Date  ? CHOLECYSTECTOMY    ? LAPAROSCOPIC CHOLECYSTECTOMY SINGLE  SITE WITH INTRAOPERATIVE CHOLANGIOGRAM N/A 01/14/2021  ? Procedure: LAPAROSCOPIC CHOLECYSTECTOMY SINGLE SITE;  Surgeon: Michael Boston, MD;  Location: [facility];  Service: General;  Laterality: N/A;  ? LAPAROSCOPY N/A 02/26/2021  ? Procedure: STAGING LAPAROSCOPY;  Surgeon: Dwan Bolt, MD;  Location: [place];  Service: General;  Laterality: N/A;  ? OPEN HEPATECTOMY  N/A 02/26/2021  ? Procedure:  OPEN PARTIAL CENTRAL HEPATECTOMY, PORTAL LYMPH NODE DISSECTION, INTRAOPERATIVE ULTRASOUND;  Surgeon: Dwan Bolt, MD;  Location: [place];  Service: General;  Laterality: N/A;  ? TEE WITHOUT CARDIOVERSION  0

## 2021-05-22 LAB — CANCER ANTIGEN 19-9: CA 19-9: 19 U/mL (ref 0–35)

## 2021-05-28 ENCOUNTER — Telehealth: Payer: Self-pay | Admitting: Hematology

## 2021-05-28 NOTE — Telephone Encounter (Signed)
Left message with follow-up appointment per 5/5 los. ?

## 2021-06-01 ENCOUNTER — Other Ambulatory Visit (HOSPITAL_COMMUNITY): Payer: Self-pay

## 2021-06-02 ENCOUNTER — Inpatient Hospital Stay: Payer: Commercial Managed Care - HMO | Admitting: Genetic Counselor

## 2021-06-02 ENCOUNTER — Inpatient Hospital Stay: Payer: Commercial Managed Care - HMO

## 2021-06-03 ENCOUNTER — Other Ambulatory Visit: Payer: Self-pay | Admitting: Hematology

## 2021-06-03 ENCOUNTER — Other Ambulatory Visit: Payer: Self-pay

## 2021-06-03 ENCOUNTER — Other Ambulatory Visit (HOSPITAL_COMMUNITY): Payer: Self-pay

## 2021-06-03 MED ORDER — CAPECITABINE 500 MG PO TABS
1000.0000 mg/m2 | ORAL_TABLET | Freq: Two times a day (BID) | ORAL | 1 refills | Status: DC
  Filled 2021-06-03: qty 112, 14d supply, fill #0
  Filled 2021-07-15: qty 112, 21d supply, fill #1

## 2021-06-16 ENCOUNTER — Other Ambulatory Visit (HOSPITAL_COMMUNITY): Payer: Self-pay

## 2021-06-17 ENCOUNTER — Inpatient Hospital Stay: Payer: Commercial Managed Care - HMO | Admitting: Hematology and Oncology

## 2021-06-17 ENCOUNTER — Inpatient Hospital Stay: Payer: Commercial Managed Care - HMO

## 2021-07-01 ENCOUNTER — Other Ambulatory Visit (HOSPITAL_COMMUNITY): Payer: Self-pay

## 2021-07-05 ENCOUNTER — Inpatient Hospital Stay: Payer: Commercial Managed Care - HMO | Attending: Nurse Practitioner | Admitting: Genetic Counselor

## 2021-07-05 ENCOUNTER — Other Ambulatory Visit (HOSPITAL_COMMUNITY): Payer: Self-pay

## 2021-07-05 ENCOUNTER — Inpatient Hospital Stay: Payer: Commercial Managed Care - HMO

## 2021-07-05 DIAGNOSIS — C23 Malignant neoplasm of gallbladder: Secondary | ICD-10-CM | POA: Insufficient documentation

## 2021-07-07 ENCOUNTER — Other Ambulatory Visit (HOSPITAL_COMMUNITY): Payer: Self-pay

## 2021-07-11 ENCOUNTER — Other Ambulatory Visit: Payer: Self-pay | Admitting: Hematology

## 2021-07-11 DIAGNOSIS — C23 Malignant neoplasm of gallbladder: Secondary | ICD-10-CM

## 2021-07-13 ENCOUNTER — Other Ambulatory Visit: Payer: Self-pay

## 2021-07-13 ENCOUNTER — Inpatient Hospital Stay (HOSPITAL_BASED_OUTPATIENT_CLINIC_OR_DEPARTMENT_OTHER): Payer: Commercial Managed Care - HMO | Admitting: Hematology

## 2021-07-13 ENCOUNTER — Inpatient Hospital Stay: Payer: Commercial Managed Care - HMO

## 2021-07-13 ENCOUNTER — Encounter: Payer: Self-pay | Admitting: Hematology

## 2021-07-13 VITALS — BP 114/62 | HR 72 | Temp 97.8°F | Resp 16 | Ht 66.0 in | Wt 196.1 lb

## 2021-07-13 DIAGNOSIS — C23 Malignant neoplasm of gallbladder: Secondary | ICD-10-CM | POA: Diagnosis present

## 2021-07-13 LAB — COMPREHENSIVE METABOLIC PANEL
ALT: 35 U/L (ref 0–44)
AST: 30 U/L (ref 15–41)
Albumin: 4.1 g/dL (ref 3.5–5.0)
Alkaline Phosphatase: 99 U/L (ref 38–126)
Anion gap: 6 (ref 5–15)
BUN: 10 mg/dL (ref 6–20)
CO2: 24 mmol/L (ref 22–32)
Calcium: 9.1 mg/dL (ref 8.9–10.3)
Chloride: 108 mmol/L (ref 98–111)
Creatinine, Ser: 0.77 mg/dL (ref 0.44–1.00)
GFR, Estimated: >60 mL/min (ref >60)
Glucose, Bld: 89 mg/dL (ref 70–99)
Potassium: 3.8 mmol/L (ref 3.5–5.1)
Sodium: 138 mmol/L (ref 135–145)
Total Bilirubin: 0.7 mg/dL (ref 0.3–1.2)
Total Protein: 7.4 g/dL (ref 6.5–8.1)

## 2021-07-13 LAB — CBC WITH DIFFERENTIAL/PLATELET
Abs Immature Granulocytes: 0.02 10*3/uL (ref 0.00–0.07)
Basophils Absolute: 0.1 10*3/uL (ref 0.0–0.1)
Basophils Relative: 1 %
Eosinophils Absolute: 0.3 10*3/uL (ref 0.0–0.5)
Eosinophils Relative: 4 %
HCT: 41.7 % (ref 36.0–46.0)
Hemoglobin: 14.4 g/dL (ref 12.0–15.0)
Immature Granulocytes: 0 %
Lymphocytes Relative: 28 %
Lymphs Abs: 2.4 10*3/uL (ref 0.7–4.0)
MCH: 29.6 pg (ref 26.0–34.0)
MCHC: 34.5 g/dL (ref 30.0–36.0)
MCV: 85.6 fL (ref 80.0–100.0)
Monocytes Absolute: 0.5 10*3/uL (ref 0.1–1.0)
Monocytes Relative: 6 %
Neutro Abs: 5.2 10*3/uL (ref 1.7–7.7)
Neutrophils Relative %: 61 %
Platelets: 254 10*3/uL (ref 150–400)
RBC: 4.87 MIL/uL (ref 3.87–5.11)
RDW: 15.7 % — ABNORMAL HIGH (ref 11.5–15.5)
WBC: 8.5 10*3/uL (ref 4.0–10.5)
nRBC: 0 % (ref 0.0–0.2)

## 2021-07-13 NOTE — Progress Notes (Signed)
[facility]   Telephone:([phone] Fax:([phone]   Clinic Follow up Note   Patient Care Team: Patient, No Pcp Per as PCP - General (General Practice) Karie Soda, MD as Consulting Physician (General Surgery) Fritzi Mandes, MD as Consulting Physician (Surgical Oncology) Malachy Mood, MD as Consulting Physician (Hematology) Pollyann Samples, NP as Nurse Practitioner (Nurse Practitioner) Marily Lente, RN as Oncology Nurse Navigator (Oncology)  Date of Service:  07/13/2021  CHIEF COMPLAINT: f/u of gallbladder cancer  CURRENT THERAPY:  Xeloda, 2000mg  BID days 1-14 q21days, started 03/29/21  ASSESSMENT & PLAN:  Shelby Cunningham is a 36 y.o. female with   1. 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 Work up showed cholecystitis. She had cholecystectomy on 01/14/21, pathology showed incidental finding of adenocarcinoma, margins and LNs clear.  -staging chest CT on 02/08/21 showed a nonspecific 9 mm right hilar lymph node. Otherwise, no metastatic disease. -abdomen MRI on 02/09/21 showed: hepatic steatosis, no liver lesions; 4 mm cystic lesion of tail of pancreas. -she underwent oncologic resection on 02/26/21 with Dr. Freida Busman, pathology was negative for malignancy.  -baseline CEA obtained on 03/19/21 was WNL. -she began Xeloda, 2000mg  BID days 1-14 q21 days x8, on 03/29/21. She has tolerated very well, with mild nausea and fatigue that has since resolved. S/p C5, she denies any side effects. -she started cycle 6 yesterday. Labs reviewed, overall WNL, will continue Xeloda.   2. Brain aneurysm, 2012 -followed by neuro, needs to re-establish care    3. Social -married with 1 son, in day care (age 30). Spouse works outside of home   4. Genetics  -Sister had ovarian cancer, s/p surgery no adjuvant treatment doing well on surveillance. Per pt, her sister's genetic testing was  negative.  -genetic testing lab was added on to 03/19/21 labs. Results were negative, with VUS in ATM and DICER1.      PLAN: -continue Xeloda C6 at same dose  -lab and f/u in 5 weeks, before last cycle 8, will order CT scan on next visit    No problem-specific Assessment & Plan notes found for this encounter.   SUMMARY OF ONCOLOGIC HISTORY: Oncology History  Adenocarcinoma of gallbladder (HCC)  01/14/2021 Cancer Staging   Staging form: Gallbladder, AJCC 8th Edition - Pathologic stage from 01/14/2021: Stage IIA (pT2a, pN0, cM0) - Signed by Pollyann Samples, NP on 02/17/2021 Total positive nodes: 0   01/14/2021 Imaging   ABD US IMPRESSION: Cholelithiasis with gallbladder wall thickening and positive sonographic Murphy sign suggesting acute cholecystitis. There is no pericholecystic fluid.   Mildly increased liver echogenicity likely reflects steatosis. There are 2 relative hypoechoic areas in the right hepatic lobe that may reflect fatty spurring or lesions. Recommend nonemergent evaluation with MRI   01/14/2021 Surgery   Lap cholecystectomy by Dr. Karie Soda    01/14/2021 Pathology Results   A. GALLBLADDER, CHOLECYSTECTOMY:  - Invasive well to moderately differentiated adenocarcinoma, 1.6 cm,  arising in association with a gallbladder adenoma with high-grade dysplasia  - Carcinoma invades beyond the muscularis propria but not into the serosal surface  - Resection margins are negative for carcinoma  - See oncology table   COMMENT:  About 10% of the tumor shows presence of a neuroendocrine component in the form of a high-grade neuroendocrine carcinoma.  Immunostains for synaptophysin, CD56, chromogranin and Ki-67 are confirmatory.    02/08/2021 Imaging   CT Chest IMPRESSION: 9  mm right hilar lymph node is nonspecific and may be reactive. Recommend attention on follow-up studies. Otherwise, no evidence of metastatic disease in the chest.   02/09/2021 Imaging   MRI ABD  IMPRESSION: 1. Expected postsurgical changes of cholecystectomy. Normal caliber common bile duct with no evidence choledocholithiasis. 2. Hepatic steatosis with areas of focal fatty sparing. No suspicious liver lesions. 3. Small cystic lesion of the tail of the pancreas measuring 4 mm. Recommend follow up pre and post contrast MRI/MRCP or pancreatic protocol CT in 1 year. This recommendation follows ACR consensus guidelines: Management of Incidental Pancreatic Cysts:    02/17/2021 Initial Diagnosis   Adenocarcinoma of gallbladder (HCC)   02/26/2021 Pathology Results   FINAL MICROSCOPIC DIAGNOSIS:   A. LIVER, CYSTIC DUCT MARGIN, EXCISION:  - Fibrosis and histiocytic infiltrate consistent with previous  procedure.  - Negative for malignancy.   B. LIVER, PARTIAL CENTRAL, HEPATECTOMY:  - Liver with focal fibrosis, histiocytic infiltrate and giant cell  reaction consistent with previous procedure.  - Negative for malignancy.   C. LYMPH NODE, PORTAL, EXCISION:  - Lymph node negative for metastatic carcinoma.   COMMENT:  The cystic duct margin specimen shows fibrosis with histiocytic  infiltrates.  No residual carcinoma is identified.   The liver specimen consist mostly of benign liver and at the gallbladder bed there is fibrosis with histiocytic infiltrates and giant cell reaction consistent with previous cholecystectomy.  No residual carcinoma is identified.    04/22/2021 Genetic Testing   Negative genetic testing on the CancerNext-Expanded+RNAinsight.  ATM c.1907A>G and DICER1 [id number] VUS were identified.  The report date is Aprili 6, 2023.  The CancerNext-Expanded gene panel offered by [facility] and includes sequencing and rearrangement analysis for the following 77 genes: AIP, ALK, APC*, ATM*, AXIN2, BAP1, BARD1, BLM, BMPR1A, BRCA1*, BRCA2*, BRIP1*, CDC73, CDH1*, CDK4, CDKN1B, CDKN2A, CHEK2*, CTNNA1, DICER1, FANCC, FH, FLCN, GALNT12, KIF1B, LZTR1, MAX, MEN1, MET, MLH1*,  MSH2*, MSH3, MSH6*, MUTYH*, NBN, NF1*, NF2, NTHL1, PALB2*, PHOX2B, PMS2*, POT1, PRKAR1A, PTCH1, PTEN*, RAD51C*, RAD51D*, RB1, RECQL, RET, SDHA, SDHAF2, SDHB, SDHC, SDHD, SMAD4, SMARCA4, SMARCB1, SMARCE1, STK11, SUFU, TMEM127, TP53*, TSC1, TSC2, VHL and XRCC2 (sequencing and deletion/duplication); EGFR, EGLN1, HOXB13, KIT, MITF, PDGFRA, POLD1, and POLE (sequencing only); EPCAM and GREM1 (deletion/duplication only). DNA and RNA analyses performed for * genes.       INTERVAL HISTORY:  Shelby Cunningham is here for a follow up of gallbladder cancer. She was last seen by me on 05/21/21. She presents to the clinic alone. She reports she is doing well overall. She tells me she had to cancel her last appointment with me due to not having a vehicle. She reports she feels well on the Xeloda, denies nausea, fatigue, or other side effects.   All other systems were reviewed with the patient and are negative.  MEDICAL HISTORY:  Past Medical History:  Diagnosis Date   Aneurysm (HCC)    Brain - has checked every 6 months   Cancer (HCC)    Exercise-induced shortness of breath    "sometimes at the gym" (01/30/2012)   GERD (gastroesophageal reflux disease)    No pertinent past medical history    Status post vacuum-assisted vaginal delivery 11/29/2016   TIA (transient ischemic attack) 01/2012    SURGICAL HISTORY: Past Surgical History:  Procedure Laterality Date   CHOLECYSTECTOMY     LAPAROSCOPIC CHOLECYSTECTOMY SINGLE SITE WITH INTRAOPERATIVE CHOLANGIOGRAM N/A 01/14/2021   Procedure: LAPAROSCOPIC CHOLECYSTECTOMY SINGLE SITE;  Surgeon: Karie Soda, MD;  Location: [facility];  Service: General;  Laterality: N/A;   LAPAROSCOPY N/A 02/26/2021   Procedure: STAGING LAPAROSCOPY;  Surgeon: Fritzi Mandes, MD;  Location: [facility] OR;  Service: General;  Laterality: N/A;   OPEN HEPATECTOMY  N/A 02/26/2021   Procedure: OPEN PARTIAL CENTRAL HEPATECTOMY, PORTAL LYMPH NODE DISSECTION, INTRAOPERATIVE ULTRASOUND;  Surgeon: Fritzi Mandes, MD;  Location: [facility] OR;  Service: General;  Laterality: N/A;   TEE WITHOUT CARDIOVERSION  01/31/2012   Procedure: TRANSESOPHAGEAL ECHOCARDIOGRAM (TEE);  Surgeon: Lewayne Bunting, MD;  Location[facility] ENDOSCOPY;  Service: Cardiovascular;  Laterality: N/A;    I have reviewed the social history and family history with the patient and they are unchanged from previous note.  ALLERGIES:  has No Known Allergies.  MEDICATIONS:  Current Outpatient Medications  Medication Sig Dispense Refill   capecitabine (XELODA) 500 MG tablet Take 4 tablets (2,000 mg total) by mouth 2 (two) times daily after a meal. Take for 14 days then off for 7 days 112 tablet 1   ondansetron (ZOFRAN-ODT) 4 MG disintegrating tablet Take 1 tablet (4 mg total) by mouth every 6 (six) hours as needed for nausea. 20 tablet 0   No current facility-administered medications for this visit.    PHYSICAL EXAMINATION: ECOG PERFORMANCE STATUS: 0 - Asymptomatic  Vitals:   07/13/21 0756  BP: 114/62  Pulse: 72  Resp: 16  Temp: 97.8 F (36.6 C)  SpO2: 100%   Wt Readings from Last 3 Encounters:  07/13/21 196 lb 1.6 oz (89 kg)  05/21/21 190 lb 8 oz (86.4 kg)  04/14/21 191 lb (86.6 kg)     GENERAL:alert, no distress and comfortable SKIN: skin color normal, no rashes or significant lesions EYES: normal, Conjunctiva are pink and non-injected, sclera clear  NEURO: alert & oriented x 3 with fluent speech  LABORATORY DATA:  I have reviewed the data as listed    Latest Ref Rng & Units 07/13/2021    7:38 AM 05/21/2021    8:39 AM 04/14/2021    1:44 PM  CBC  WBC 4.0 - 10.5 K/uL 8.5  7.2  8.9   Hemoglobin 12.0 - 15.0 g/dL 60.4  54.0  98.1   Hematocrit 36.0 - 46.0 % 41.7  41.0  40.1   Platelets 150 - 400 K/uL 254  275  333         Latest Ref Rng & Units 05/21/2021    8:39 AM 04/14/2021    1:44 PM 03/19/2021    1:43 PM  CMP  Glucose 70 - 99 mg/dL 97  96  191   BUN 6 - 20 mg/dL 9  10  8    Creatinine 0.44 - 1.00 mg/dL 4.78   2.95  6.21   Sodium 135 - 145 mmol/L 139  137  140   Potassium 3.5 - 5.1 mmol/L 3.8  3.5  3.7   Chloride 98 - 111 mmol/L 108  107  105   CO2 22 - 32 mmol/L 24  23  28    Calcium 8.9 - 10.3 mg/dL 9.1  9.1  9.5   Total Protein 6.5 - 8.1 g/dL 7.6  7.9  7.4   Total Bilirubin 0.3 - 1.2 mg/dL 0.7  0.3  0.3   Alkaline Phos 38 - 126 U/L 91  109  180   AST 15 - 41 U/L 24  35  31   ALT 0 - 44 U/L 27  38  47       RADIOGRAPHIC STUDIES: I have personally reviewed the  radiological images as listed and agreed with the findings in the report. No results found.    No orders of the defined types were placed in this encounter.  All questions were answered. The patient knows to call the clinic with any problems, questions or concerns. No barriers to learning was detected. The total time spent in the appointment was 25 minutes.     Malachy Mood, MD 07/13/2021   I, Mickie Bail, am acting as scribe for Malachy Mood, MD.   I have reviewed the above documentation for accuracy and completeness, and I agree with the above.

## 2021-07-14 ENCOUNTER — Other Ambulatory Visit (HOSPITAL_COMMUNITY): Payer: Self-pay

## 2021-07-14 LAB — CANCER ANTIGEN 19-9: CA 19-9: 18 U/mL (ref 0–35)

## 2021-07-15 ENCOUNTER — Telehealth: Payer: Self-pay | Admitting: Hematology

## 2021-07-15 ENCOUNTER — Other Ambulatory Visit (HOSPITAL_COMMUNITY): Payer: Self-pay

## 2021-07-15 NOTE — Telephone Encounter (Signed)
Scheduled per 6/28 los, message has been left with pt

## 2021-07-28 ENCOUNTER — Other Ambulatory Visit (HOSPITAL_COMMUNITY): Payer: Self-pay

## 2021-07-30 ENCOUNTER — Other Ambulatory Visit: Payer: Self-pay | Admitting: Hematology

## 2021-07-30 ENCOUNTER — Other Ambulatory Visit (HOSPITAL_COMMUNITY): Payer: Self-pay

## 2021-07-30 MED ORDER — CAPECITABINE 500 MG PO TABS
1000.0000 mg/m2 | ORAL_TABLET | Freq: Two times a day (BID) | ORAL | 1 refills | Status: DC
  Filled 2021-07-30: qty 112, 21d supply, fill #0
  Filled 2021-08-26: qty 112, 21d supply, fill #1

## 2021-08-12 ENCOUNTER — Other Ambulatory Visit (HOSPITAL_COMMUNITY): Payer: Self-pay

## 2021-08-17 ENCOUNTER — Other Ambulatory Visit (HOSPITAL_COMMUNITY): Payer: Self-pay

## 2021-08-19 ENCOUNTER — Inpatient Hospital Stay (HOSPITAL_BASED_OUTPATIENT_CLINIC_OR_DEPARTMENT_OTHER): Payer: Commercial Managed Care - HMO | Admitting: Hematology

## 2021-08-19 ENCOUNTER — Other Ambulatory Visit: Payer: Self-pay

## 2021-08-19 ENCOUNTER — Encounter: Payer: Self-pay | Admitting: Hematology

## 2021-08-19 ENCOUNTER — Inpatient Hospital Stay: Payer: Commercial Managed Care - HMO | Attending: Nurse Practitioner

## 2021-08-19 VITALS — BP 125/80 | HR 79 | Temp 98.3°F | Resp 18 | Wt 196.1 lb

## 2021-08-19 DIAGNOSIS — C23 Malignant neoplasm of gallbladder: Secondary | ICD-10-CM | POA: Diagnosis not present

## 2021-08-19 LAB — COMPREHENSIVE METABOLIC PANEL
ALT: 31 U/L (ref 0–44)
AST: 26 U/L (ref 15–41)
Albumin: 4.2 g/dL (ref 3.5–5.0)
Alkaline Phosphatase: 96 U/L (ref 38–126)
Anion gap: 8 (ref 5–15)
BUN: 10 mg/dL (ref 6–20)
CO2: 22 mmol/L (ref 22–32)
Calcium: 8.9 mg/dL (ref 8.9–10.3)
Chloride: 110 mmol/L (ref 98–111)
Creatinine, Ser: 0.79 mg/dL (ref 0.44–1.00)
GFR, Estimated: >60 mL/min (ref >60)
Glucose, Bld: 98 mg/dL (ref 70–99)
Potassium: 3.7 mmol/L (ref 3.5–5.1)
Sodium: 140 mmol/L (ref 135–145)
Total Bilirubin: 0.5 mg/dL (ref 0.3–1.2)
Total Protein: 7.5 g/dL (ref 6.5–8.1)

## 2021-08-19 LAB — CBC WITH DIFFERENTIAL/PLATELET
Abs Immature Granulocytes: 0.01 10*3/uL (ref 0.00–0.07)
Basophils Absolute: 0.1 10*3/uL (ref 0.0–0.1)
Basophils Relative: 1 %
Eosinophils Absolute: 0.3 10*3/uL (ref 0.0–0.5)
Eosinophils Relative: 4 %
HCT: 41.5 % (ref 36.0–46.0)
Hemoglobin: 14.4 g/dL (ref 12.0–15.0)
Immature Granulocytes: 0 %
Lymphocytes Relative: 28 %
Lymphs Abs: 2.1 10*3/uL (ref 0.7–4.0)
MCH: 30.3 pg (ref 26.0–34.0)
MCHC: 34.7 g/dL (ref 30.0–36.0)
MCV: 87.2 fL (ref 80.0–100.0)
Monocytes Absolute: 0.5 10*3/uL (ref 0.1–1.0)
Monocytes Relative: 6 %
Neutro Abs: 4.7 10*3/uL (ref 1.7–7.7)
Neutrophils Relative %: 61 %
Platelets: 245 10*3/uL (ref 150–400)
RBC: 4.76 MIL/uL (ref 3.87–5.11)
RDW: 13.8 % (ref 11.5–15.5)
WBC: 7.7 10*3/uL (ref 4.0–10.5)
nRBC: 0 % (ref 0.0–0.2)

## 2021-08-19 NOTE — Progress Notes (Signed)
[place]   Telephone:(336) [phone] Fax:(336) [phone]   Clinic Follow up Note   Patient Care Team: Patient, No Pcp Per as PCP - General (Coun[place]) Shelby Boston, MD as Consulting Physician (General Surgery) Shelby Bolt, MD as Consulting Physician (Surgical Oncology) Shelby Merle, MD as Consulting Physician (Hematology) Shelby Feeling, NP as Nurse Practitioner (Nurse Practitioner) Shelby Bake, RN as Oncology Nurse Navigator (Oncology)  Date of Service:  08/19/2021  CHIEF COMPLAINT: f/u of gallbladder cancer  CURRENT THERAPY:  Xeloda, 2080m BID days 1-14 q21days, started 03/29/21  ASSESSMENT & PLAN:  RJulianne Chamberlinis a 36y.o. female with   1. 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 Work up showed cholecystitis. She had cholecystectomy on 01/14/21, pathology showed incidental finding of adenocarcinoma, margins and LNs clear.  -staging chest CT on 02/08/21 showed a nonspecific 9 mm right hilar lymph node. Otherwise, no metastatic disease. -abdomen MRI on 02/09/21 showed: hepatic steatosis, no liver lesions; 4 mm cystic lesion of tail of pancreas. -she underwent oncologic resection on 02/26/21 with Dr. AZenia Cunningham pathology was negative for malignancy.  -baseline CEA obtained on 03/19/21 was WNL. -she began Xeloda, 20031mBID days 1-14 q21 days x8, on 03/29/21. She has tolerated very well, with mild nausea and fatigue that has since resolved. S/p C7, she denies any side effects aside from some skin darkening. -labs reviewed, overall WNL, will continue Xeloda. She will start cycle 8, her final cycle, on Monday, 8/7 -plan for post-treatment CT in 4-5 weeks   2. Brain aneurysm, 2012 -followed by neuro, needs to re-establish care    3. Social -married with 1 son, in day care (age 24)70 Spouse works outside of home   4. Genetics  -Sister had ovarian cancer, s/p  surgery no adjuvant treatment doing well on surveillance. Per pt, her sister's genetic testing was negative.  -genetic testing lab was added on to 03/19/21 labs. Results were negative, with VUS in ATM and DICER1.      PLAN: -continue Xeloda at same dose, start cycle 8 on 8/7 (last cycle) -f/u in 5 weeks, with lab and CT several days before   No problem-specific Assessment & Plan notes found for this encounter.   SUMMARY OF ONCOLOGIC HISTORY: Oncology History  A[place]bladder ([place] 01/14/20[place] Cancer Staging   Staging form: Gallbladder, AJCC 8th Edition - Pathologic stage from 01/14/2021: Stage IIA (pT2a, pN0, cM0) - Signed by BuAlla FeelingNP on 02/17/2021 Total positive nodes: 0   01/14/2021 Imaging   ABD [place]: Cholelithiasis with gallbladder wall thickening and positive sonographic Murphy sign suggesting acute cholecystitis. There is no pericholecystic fluid.   Mildly increased liver echogenicity likely reflects steatosis. There are 2 relative hypoechoic areas in the right hepatic lobe that may reflect fatty spurring or lesions. Recommend nonemergent evaluation with MRI   01/14/2021 Surgery   Lap cholecystectomy by Shelby Cunningham  01/14/2021 Pathology Results   A. GALLBLADDER, CHOLECYSTECTOMY:  - Invasive well to moderately differentiated adenocarcinoma, 1.6 cm,  arising in association with a gallbladder adenoma with high-grade dysplasia  - Carcinoma invades beyond the muscularis propria but not into the serosal surface  - Resection margins are negative for carcinoma  - See oncology table   COMMENT:  About 10% of the tumor shows presence of a neuroendocrine component in the form of a high-grade neuroendocrine carcinoma.  Immunostains for synaptophysin, CD56,  chromogranin and Ki-67 are confirmatory.    02/08/2021 Imaging   CT Chest IMPRESSION: 9 mm right hilar lymph node is nonspecific and may be reactive. Recommend attention on follow-up studies.  Otherwise, no evidence of metastatic disease in the chest.   02/09/2021 Imaging   MRI ABD IMPRESSION: 1. Expected postsurgical changes of cholecystectomy. Normal caliber common bile duct with no evidence choledocholithiasis. 2. Hepatic steatosis with areas of focal fatty sparing. No suspicious liver lesions. 3. Small cystic lesion of the tail of the pancreas measuring 4 mm. Recommend follow up pre and post contrast MRI/MRCP or pancreatic protocol CT in 1 year. This recommendation follows ACR consensus guidelines: Management of Incidental Pancreatic Cysts:    02/17/2021 Initial Diagnosis   Adenocarcinoma of gallbladder ([place])   02/26/2021 Pathology Results   FINAL MICROSCOPIC DIAGNOSIS:   A. LIVER, CYSTIC DUCT MARGIN, EXCISION:  - Fibrosis and histiocytic infiltrate consistent with previous  procedure.  - Negative for malignancy.   B. LIVER, PARTIAL CENTRAL, HEPATECTOMY:  - Liver with focal fibrosis, histiocytic infiltrate and giant cell  reaction consistent with previous procedure.  - Negative for malignancy.   C. LYMPH NODE, PORTAL, EXCISION:  - Lymph node negative for metastatic carcinoma.   COMMENT:  The cystic duct margin specimen shows fibrosis with histiocytic  infiltrates.  No residual carcinoma is identified.   The liver specimen consist mostly of benign liver and at the gallbladder bed there is fibrosis with histiocytic infiltrates and giant cell reaction consistent with previous cholecystectomy.  No residual carcinoma is identified.    04/22/2021 Genetic Testing   Negative genetic testing on the CancerNext-Expanded+RNAinsight.  ATM c.1907A>G and DICER1 [id number] VUS were identified.  The report date is Aprili 6, 2023.  The CancerNext-Expanded gene panel offered by [facility] and includes sequencing and rearrangement analysis for the following 77 genes: AIP, ALK, APC*, ATM*, AXIN2, BAP1, BARD1, BLM, BMPR1A, BRCA1*, BRCA2*, BRIP1*, CDC73, CDH1*, CDK4, CDKN1B, CDKN2A,  CHEK2*, CTNNA1, DICER1, FANCC, FH, FLCN, GALNT12, KIF1B, LZTR1, MAX, MEN1, MET, MLH1*, MSH2*, MSH3, MSH6*, MUTYH*, NBN, NF1*, NF2, NTHL1, PALB2*, PHOX2B, PMS2*, POT1, PRKAR1A, PTCH1, PTEN*, RAD51C*, RAD51D*, RB1, RECQL, RET, SDHA, SDHAF2, SDHB, SDHC, SDHD, SMAD4, SMARCA4, SMARCB1, SMARCE1, STK11, SUFU, TMEM127, TP53*, TSC1, TSC2, VHL and XRCC2 (sequencing and deletion/duplication); EGFR, EGLN1, HOXB13, KIT, MITF, PDGFRA, POLD1, and POLE (sequencing only); EPCAM and GREM1 (deletion/duplication only). DNA and RNA analyses performed for * genes.       INTERVAL HISTORY:  Shelby Cunningham is here for a follow up of gallbladder cancer. She was last seen by me on 07/13/21. She presents to the clinic alone. She reports she is doing very well overall. She notes her only noticeable side effects is some skin darkening to the bottoms of her feet.   All other systems were reviewed with the patient and are negative.  MEDICAL HISTORY:  Past Medical History:  Diagnosis Date   Aneurysm ([place])    Brain - has checked every 6 months   Cancer ([place])    Exercise-induced shortness of breath    "sometimes at the gym" (01/30/2012)   GERD (gastroesophageal reflux disease)    No pertinent past medical history    Status post vacuum-assisted vaginal delivery 11/29/2016   TIA (transient ischemic attack) 01/2012    SURGICAL HISTORY: Past Surgical History:  Procedure Laterality Date   CHOLECYSTECTOMY     LAPAROSCOPIC CHOLECYSTECTOMY SINGLE SITE WITH INTRAOPERATIVE CHOLANGIOGRAM N/A 01/14/2021   Procedure: LAPAROSCOPIC CHOLECYSTECTOMY SINGLE SITE;  Surgeon: Shelby Boston, MD;  Location: [facility];  Service: General;  Laterality: N/A;   LAPAROSCOPY N/A 02/26/2021   Procedure: STAGING LAPAROSCOPY;  Surgeon: Shelby Bolt, MD;  Location: [place];  Service: General;  Laterality: N/A;   OPEN HEPATECTOMY  N/A 02/26/2021   Procedure: OPEN PARTIAL CENTRAL HEPATECTOMY, PORTAL LYMPH NODE DISSECTION, INTRAOPERATIVE ULTRASOUND;  Surgeon:  Shelby Bolt, MD;  Location: [place];  Service: General;  Laterality: N/A;   TEE WITHOUT CARDIOVERSION  01/31/2012   Procedure: TRANSESOPHAGEAL ECHOCARDIOGRAM (TEE);  Surgeon: Lelon Perla, MD;  Location: [facility] ENDOSCOPY;  Service: Cardiovascular;  Laterality: N/A;    I have reviewed the social history and family history with the patient and they are unchanged from previous note.  ALLERGIES:  has No Known Allergies.  MEDICATIONS:  Current Outpatient Medications  Medication Sig Dispense Refill   capecitabine (XELODA) 500 MG tablet Take 4 tablets (2,000 mg total) by mouth 2 (two) times daily after a meal. Take for 14 days then off for 7 days 112 tablet 1   ondansetron (ZOFRAN-ODT) 4 MG disintegrating tablet Take 1 tablet (4 mg total) by mouth every 6 (six) hours as needed for nausea. 20 tablet 0   No current facility-administered medications for this visit.    PHYSICAL EXAMINATION: ECOG PERFORMANCE STATUS: 0 - Asymptomatic  Vitals:   08/19/21 0821  BP: 125/80  Pulse: 79  Resp: 18  Temp: 98.3 F (36.8 C)  SpO2: 100%   Wt Readings from Last 3 Encounters:  08/19/21 196 lb 1 oz (88.9 kg)  07/13/21 196 lb 1.6 oz (89 kg)  05/21/21 190 lb 8 oz (86.4 kg)     GENERAL:alert, no distress and comfortable SKIN: skin color normal, no rashes or significant lesions except hyperpigmentation in hands  EYES: normal, Conjunctiva are pink and non-injected, sclera clear  NEURO: alert & oriented x 3 with fluent speech  LABORATORY DATA:  I have reviewed the data as listed    Latest Ref Rng & Units 08/19/2021    7:52 AM 07/13/2021    7:38 AM 05/21/2021    8:39 AM  CBC  WBC 4.0 - 10.5 K/uL 7.7  8.5  7.2   Hemoglobin 12.0 - 15.0 g/dL 14.4  14.4  13.8   Hematocrit 36.0 - 46.0 % 41.5  41.7  41.0   Platelets 150 - 400 K/uL 245  254  275         Latest Ref Rng & Units 08/19/2021    7:52 AM 07/13/2021    7:38 AM 05/21/2021    8:39 AM  CMP  Glucose 70 - 99 mg/dL 98  89  97   BUN 6 - 20 mg/dL _0 Creatinine 0.44 - 1.00 mg/dL 0.79  0.77  0.74   Sodium 135 - 145 mmol/L 140  138  139   Potassium 3.5 - 5.1 mmol/L 3.7  3.8  3.8   Chloride 98 - 111 mmol/L 110  108  108   CO2 22 - 32 mmol/L _1 Calcium 8.9 - 10.3 mg/dL 8.9  9.1  9.1   Total Protein 6.5 - 8.1 g/dL 7.5  7.4  7.6   Total Bilirubin 0.3 - 1.2 mg/dL 0.5  0.7  0.7   Alkaline Phos 38 - 126 U/L 96  99  91   AST 15 - 41 U/L _2 ALT 0 - 44 U/L 31  35  27  RADIOGRAPHIC STUDIES: I have personally reviewed the radiological images as listed and agreed with the findings in the report. No results found.    Orders Placed This Encounter  Procedures   CT ABDOMEN PELVIS W CONTRAST    Standing Status:   Future    Standing Expiration Date:   08/20/2022    Order Specific Question:   If indicated for the ordered procedure, I authorize the administration of contrast media per Radiology protocol    Answer:   Yes    Order Specific Question:   Preferred imaging location?    Answer:   [facility]    Order Specific Question:   Is Oral Contrast requested for this exam?    Answer:   Yes, Per Radiology protocol    Order Specific Question:   Is patient pregnant?    Answer:   No   All questions were answered. The patient knows to call the clinic with any problems, questions or concerns. No barriers to learning was detected. The total time spent in the appointment was 30 minutes.     Shelby Merle, MD 08/19/2021   I, Wilburn Mylar, am acting as scribe for Shelby Merle, MD.   I have reviewed the above documentation for accuracy and completeness, and I agree with the above.

## 2021-08-20 ENCOUNTER — Telehealth: Payer: Self-pay | Admitting: Hematology

## 2021-08-20 NOTE — Telephone Encounter (Signed)
Left message with follow-up appointments per 8/3 los.

## 2021-08-21 LAB — CANCER ANTIGEN 19-9: CA 19-9: 19 U/mL (ref 0–35)

## 2021-08-26 ENCOUNTER — Other Ambulatory Visit (HOSPITAL_COMMUNITY): Payer: Self-pay

## 2021-08-27 ENCOUNTER — Other Ambulatory Visit (HOSPITAL_COMMUNITY): Payer: Self-pay

## 2021-08-27 ENCOUNTER — Other Ambulatory Visit: Payer: Self-pay

## 2021-09-03 ENCOUNTER — Other Ambulatory Visit: Payer: Self-pay

## 2021-09-03 DIAGNOSIS — C23 Malignant neoplasm of gallbladder: Secondary | ICD-10-CM

## 2021-09-14 ENCOUNTER — Other Ambulatory Visit (HOSPITAL_COMMUNITY): Payer: Self-pay

## 2021-09-21 ENCOUNTER — Inpatient Hospital Stay: Payer: Commercial Managed Care - HMO | Attending: Nurse Practitioner

## 2021-09-21 ENCOUNTER — Other Ambulatory Visit: Payer: Self-pay

## 2021-09-21 DIAGNOSIS — C23 Malignant neoplasm of gallbladder: Secondary | ICD-10-CM | POA: Diagnosis present

## 2021-09-21 LAB — CBC WITH DIFFERENTIAL/PLATELET
Abs Immature Granulocytes: 0.01 10*3/uL (ref 0.00–0.07)
Basophils Absolute: 0.1 10*3/uL (ref 0.0–0.1)
Basophils Relative: 1 %
Eosinophils Absolute: 0.3 10*3/uL (ref 0.0–0.5)
Eosinophils Relative: 5 %
HCT: 41.9 % (ref 36.0–46.0)
Hemoglobin: 14.4 g/dL (ref 12.0–15.0)
Immature Granulocytes: 0 %
Lymphocytes Relative: 30 %
Lymphs Abs: 2.1 10*3/uL (ref 0.7–4.0)
MCH: 29.8 pg (ref 26.0–34.0)
MCHC: 34.4 g/dL (ref 30.0–36.0)
MCV: 86.6 fL (ref 80.0–100.0)
Monocytes Absolute: 0.4 10*3/uL (ref 0.1–1.0)
Monocytes Relative: 6 %
Neutro Abs: 4.2 10*3/uL (ref 1.7–7.7)
Neutrophils Relative %: 58 %
Platelets: 275 10*3/uL (ref 150–400)
RBC: 4.84 MIL/uL (ref 3.87–5.11)
RDW: 12.9 % (ref 11.5–15.5)
WBC: 7.1 10*3/uL (ref 4.0–10.5)
nRBC: 0 % (ref 0.0–0.2)

## 2021-09-21 LAB — COMPREHENSIVE METABOLIC PANEL
ALT: 35 U/L (ref 0–44)
AST: 30 U/L (ref 15–41)
Albumin: 4.2 g/dL (ref 3.5–5.0)
Alkaline Phosphatase: 106 U/L (ref 38–126)
Anion gap: 7 (ref 5–15)
BUN: 10 mg/dL (ref 6–20)
CO2: 25 mmol/L (ref 22–32)
Calcium: 9.4 mg/dL (ref 8.9–10.3)
Chloride: 107 mmol/L (ref 98–111)
Creatinine, Ser: 0.79 mg/dL (ref 0.44–1.00)
GFR, Estimated: >60 mL/min (ref >60)
Glucose, Bld: 95 mg/dL (ref 70–99)
Potassium: 4 mmol/L (ref 3.5–5.1)
Sodium: 139 mmol/L (ref 135–145)
Total Bilirubin: 0.4 mg/dL (ref 0.3–1.2)
Total Protein: 7.5 g/dL (ref 6.5–8.1)

## 2021-09-24 ENCOUNTER — Inpatient Hospital Stay: Payer: Commercial Managed Care - HMO | Admitting: Hematology

## 2021-09-24 ENCOUNTER — Telehealth: Payer: Self-pay | Admitting: Hematology

## 2021-09-24 NOTE — Telephone Encounter (Signed)
R/s per 9/8 in basket, message has been left

## 2021-10-07 ENCOUNTER — Ambulatory Visit
Admission: RE | Admit: 2021-10-07 | Discharge: 2021-10-07 | Disposition: A | Discharge status: Home / Self Care | Payer: Commercial Managed Care - HMO | Source: Ambulatory Visit | Attending: Hematology | Admitting: Hematology

## 2021-10-07 DIAGNOSIS — C23 Malignant neoplasm of gallbladder: Secondary | ICD-10-CM

## 2021-10-07 MED ORDER — IOPAMIDOL (ISOVUE-300) INJECTION 61%
100.0000 mL | Freq: Once | INTRAVENOUS | Status: AC | PRN
  Administered 2021-10-07: 100 mL via INTRAVENOUS

## 2021-10-14 ENCOUNTER — Encounter: Payer: Self-pay | Admitting: Hematology

## 2021-10-14 ENCOUNTER — Inpatient Hospital Stay: Payer: Commercial Managed Care - HMO | Admitting: Hematology

## 2021-10-14 ENCOUNTER — Other Ambulatory Visit: Payer: Self-pay

## 2021-10-14 VITALS — BP 121/68 | HR 81 | Temp 98.4°F | Resp 17 | Ht 66.0 in | Wt 202.2 lb

## 2021-10-14 DIAGNOSIS — C23 Malignant neoplasm of gallbladder: Secondary | ICD-10-CM

## 2021-10-14 NOTE — Progress Notes (Signed)
[place]   Telephone:([phone] Fax:(336) [phone]   Clinic Follow up Note   Patient Care Team: Patient, No Pcp Per as PCP - General ([place]) Michael Boston, MD as Consulting Physician (General Surgery) Dwan Bolt, MD as Consulting Physician (Surgical Oncology) Truitt Merle, MD as Consulting Physician (Hematology) Alla Feeling, NP as Nurse Practitioner (Nurse Practitioner)  Date of Service:  10/14/2021  CHIEF COMPLAINT: f/u of gallbladder cancer  CURRENT THERAPY:  Surveillance  ASSESSMENT & PLAN:  Shelby Cunningham is a 36 y.o. female with   1. 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 Work up showed cholecystitis. S/p cholecystectomy on 01/14/21, pathology showed incidental finding of adenocarcinoma, margins and LNs clear.  -staging chest CT on 02/08/21 showed a nonspecific 9 mm right hilar lymph node. Otherwise, no metastatic disease. -abdomen MRI on 02/09/21 showed: hepatic steatosis, no liver lesions; 4 mm cystic lesion of tail of pancreas. -s/p resection on 02/26/21 with Dr. Zenia Resides, pathology was negative for malignancy.  -baseline CEA obtained on 03/19/21 was WNL. -she completed 8 cycles adjuvant Xeloda 03/29/21 - 09/05/21. She tolerated very well. -post-treatment CT AP 10/07/21 showed: indeterminate 2.7 cm subcapsular liver focus, not clearly visualized on 01/2021 MRI, benign focal fat vs early tumor recurrence; no additional potential findings of metastatic disease.  -I reviewed the results with her today. The area in the liver is likely postoperative given the location, but I recommend MRI to rule out recurrence. She is agreeable, and I ordered today. We discussed symptoms to watch for, including right-sided flank pain or jaundice symptoms (dark urine, yellowed eyes). -labs from 9/5 reviewed, all WNL. We will continue surveillance to complete 5 years.    2. Brain aneurysm, 2012 -followed by neuro    PLAN: -abdomen MRI in 2 weeks to evaluate liver lesion seen on CT. Will call her with results.  -lab and f/u in 3 months   No problem-specific Assessment & Plan notes found for this encounter.   SUMMARY OF ONCOLOGIC HISTORY: Oncology History  Adenocarcinoma of gallbladder ([place])  01/14/2021 Cancer Staging   Staging form: Gallbladder, AJCC 8th Edition - Pathologic stage from 01/14/2021: Stage IIA (pT2a, pN0, cM0) - Signed by Alla Feeling, NP on 02/17/2021 Total positive nodes: 0   01/14/2021 Imaging   ABD US IMPRESSION: Cholelithiasis with gallbladder wall thickening and positive sonographic Murphy sign suggesting acute cholecystitis. There is no pericholecystic fluid.   Mildly increased liver echogenicity likely reflects steatosis. There are 2 relative hypoechoic areas in the right hepatic lobe that may reflect fatty spurring or lesions. Recommend nonemergent evaluation with MRI   01/14/2021 Surgery   Lap cholecystectomy by Dr. Michael Boston    01/14/2021 Pathology Results   A. GALLBLADDER, CHOLECYSTECTOMY:  - Invasive well to moderately differentiated adenocarcinoma, 1.6 cm,  arising in association with a gallbladder adenoma with high-grade dysplasia  - Carcinoma invades beyond the muscularis propria but not into the serosal surface  - Resection margins are negative for carcinoma  - See oncology table   COMMENT:  About 10% of the tumor shows presence of a neuroendocrine component in the form of a high-grade neuroendocrine carcinoma.  Immunostains for synaptophysin, CD56, chromogranin and Ki-67 are confirmatory.    02/08/2021 Imaging   CT Chest IMPRESSION: 9 mm right hilar lymph node is nonspecific and may be reactive. Recommend attention on follow-up studies. Otherwise, no evidence of metastatic disease in the chest.  02/09/2021 Imaging   MRI ABD IMPRESSION: 1. Expected postsurgical changes of cholecystectomy. Normal caliber  common bile duct with no evidence choledocholithiasis. 2. Hepatic steatosis with areas of focal fatty sparing. No suspicious liver lesions. 3. Small cystic lesion of the tail of the pancreas measuring 4 mm. Recommend follow up pre and post contrast MRI/MRCP or pancreatic protocol CT in 1 year. This recommendation follows ACR consensus guidelines: Management of Incidental Pancreatic Cysts:    02/17/2021 Initial Diagnosis   Adenocarcinoma of gallbladder ([place])   02/26/2021 Pathology Results   FINAL MICROSCOPIC DIAGNOSIS:   A. LIVER, CYSTIC DUCT MARGIN, EXCISION:  - Fibrosis and histiocytic infiltrate consistent with previous  procedure.  - Negative for malignancy.   B. LIVER, PARTIAL CENTRAL, HEPATECTOMY:  - Liver with focal fibrosis, histiocytic infiltrate and giant cell  reaction consistent with previous procedure.  - Negative for malignancy.   C. LYMPH NODE, PORTAL, EXCISION:  - Lymph node negative for metastatic carcinoma.   COMMENT:  The cystic duct margin specimen shows fibrosis with histiocytic  infiltrates.  No residual carcinoma is identified.   The liver specimen consist mostly of benign liver and at the gallbladder bed there is fibrosis with histiocytic infiltrates and giant cell reaction consistent with previous cholecystectomy.  No residual carcinoma is identified.    04/22/2021 Genetic Testing   Negative genetic testing on the CancerNext-Expanded+RNAinsight.  ATM c.1907A>G and DICER1 [id number] VUS were identified.  The report date is Aprili 6, 2023.  The CancerNext-Expanded gene panel offered by [facility] and includes sequencing and rearrangement analysis for the following 77 genes: AIP, ALK, APC*, ATM*, AXIN2, BAP1, BARD1, BLM, BMPR1A, BRCA1*, BRCA2*, BRIP1*, CDC73, CDH1*, CDK4, CDKN1B, CDKN2A, CHEK2*, CTNNA1, DICER1, FANCC, FH, FLCN, GALNT12, KIF1B, LZTR1, MAX, MEN1, MET, MLH1*, MSH2*, MSH3, MSH6*, MUTYH*, NBN, NF1*, NF2, NTHL1, PALB2*, PHOX2B, PMS2*, POT1,  PRKAR1A, PTCH1, PTEN*, RAD51C*, RAD51D*, RB1, RECQL, RET, SDHA, SDHAF2, SDHB, SDHC, SDHD, SMAD4, SMARCA4, SMARCB1, SMARCE1, STK11, SUFU, TMEM127, TP53*, TSC1, TSC2, VHL and XRCC2 (sequencing and deletion/duplication); EGFR, EGLN1, HOXB13, KIT, MITF, PDGFRA, POLD1, and POLE (sequencing only); EPCAM and GREM1 (deletion/duplication only). DNA and RNA analyses performed for * genes.       INTERVAL HISTORY:  Shelby Cunningham is here for a follow up of gallbladder cancer. She was last seen by me on 08/19/21. She presents to the clinic alone. She reports she has recovered well from treatment. She denies any residual or new concerns.   All other systems were reviewed with the patient and are negative.  MEDICAL HISTORY:  Past Medical History:  Diagnosis Date   Aneurysm ([place])    Brain - has checked every 6 months   Cancer ([place])    Exercise-induced shortness of breath    "sometimes at the gym" (01/30/2012)   GERD (gastroesophageal reflux disease)    No pertinent past medical history    Status post vacuum-assisted vaginal delivery 11/29/2016   TIA (transient ischemic attack) 01/2012    SURGICAL HISTORY: Past Surgical History:  Procedure Laterality Date   CHOLECYSTECTOMY     LAPAROSCOPIC CHOLECYSTECTOMY SINGLE SITE WITH INTRAOPERATIVE CHOLANGIOGRAM N/A 01/14/2021   Procedure: LAPAROSCOPIC CHOLECYSTECTOMY SINGLE SITE;  Surgeon: Michael Boston, MD;  Location: [facility];  Service: General;  Laterality: N/A;   LAPAROSCOPY N/A 02/26/2021   Procedure: STAGING LAPAROSCOPY;  Surgeon: Dwan Bolt, MD;  Location: [place];  Service: General;  Laterality: N/A;   OPEN HEPATECTOMY  N/A 02/26/2021   Procedure: OPEN PARTIAL CENTRAL HEPATECTOMY, PORTAL LYMPH NODE DISSECTION, INTRAOPERATIVE ULTRASOUND;  Surgeon: Dwan Bolt, MD;  Location: [place];  Service: General;  Laterality: N/A;   TEE WITHOUT CARDIOVERSION  01/31/2012   Procedure: TRANSESOPHAGEAL ECHOCARDIOGRAM (TEE);  Surgeon: Lelon Perla, MD;  Location: [facility]  ENDOSCOPY;  Service: Cardiovascular;  Laterality: N/A;    I have reviewed the social history and family history with the patient and they are unchanged from previous note.  ALLERGIES:  has No Known Allergies.  MEDICATIONS:  Current Outpatient Medications  Medication Sig Dispense Refill   capecitabine (XELODA) 500 MG tablet Take 4 tablets (2,000 mg total) by mouth 2 (two) times daily after a meal. Take for 14 days then off for 7 days 112 tablet 1   ondansetron (ZOFRAN-ODT) 4 MG disintegrating tablet Take 1 tablet (4 mg total) by mouth every 6 (six) hours as needed for nausea. 20 tablet 0   No current facility-administered medications for this visit.    PHYSICAL EXAMINATION: ECOG PERFORMANCE STATUS: 0 - Asymptomatic  Vitals:   10/14/21 1310  BP: 121/68  Pulse: 81  Resp: 17  Temp: 98.4 F (36.9 C)  SpO2: 100%   Wt Readings from Last 3 Encounters:  10/14/21 202 lb 3.2 oz (91.7 kg)  08/19/21 196 lb 1 oz (88.9 kg)  07/13/21 196 lb 1.6 oz (89 kg)     GENERAL:alert, no distress and comfortable SKIN: skin color normal, no rashes or significant lesions EYES: normal, Conjunctiva are pink and non-injected, sclera clear  NEURO: alert & oriented x 3 with fluent speech  LABORATORY DATA:  I have reviewed the data as listed    Latest Ref Rng & Units 09/21/2021    8:08 AM 08/19/2021    7:52 AM 07/13/2021    7:38 AM  CBC  WBC 4.0 - 10.5 K/uL 7.1  7.7  8.5   Hemoglobin 12.0 - 15.0 g/dL 14.4  14.4  14.4   Hematocrit 36.0 - 46.0 % 41.9  41.5  41.7   Platelets 150 - 400 K/uL 275  245  254         Latest Ref Rng & Units 09/21/2021    8:08 AM 08/19/2021    7:52 AM 07/13/2021    7:38 AM  CMP  Glucose 70 - 99 mg/dL 95  98  89   BUN 6 - 20 mg/dL '10  10  10   ' Creatinine 0.44 - 1.00 mg/dL 0.79  0.79  0.77   Sodium 135 - 145 mmol/L 139  140  138   Potassium 3.5 - 5.1 mmol/L 4.0  3.7  3.8   Chloride 98 - 111 mmol/L 107  110  108   CO2 22 - 32 mmol/L '25  22  24   ' Calcium 8.9 - 10.3 mg/dL 9.4   8.9  9.1   Total Protein 6.5 - 8.1 g/dL 7.5  7.5  7.4   Total Bilirubin 0.3 - 1.2 mg/dL 0.4  0.5  0.7   Alkaline Phos 38 - 126 U/L 106  96  99   AST 15 - 41 U/L '30  26  30   ' ALT 0 - 44 U/L 35  31  35       RADIOGRAPHIC STUDIES: I have personally reviewed the radiological images as listed and agreed with the findings in the report. No results found.    No orders of the defined types were placed in this encounter.  All questions were answered. The patient knows to call the clinic with any problems, questions or concerns. No barriers to  learning was detected. The total time spent in the appointment was 30 minutes.     Truitt Merle, MD 10/14/2021   I, Wilburn Mylar, am acting as scribe for Truitt Merle, MD.   I have reviewed the above documentation for accuracy and completeness, and I agree with the above.

## 2021-10-20 ENCOUNTER — Telehealth: Payer: Self-pay

## 2021-10-20 NOTE — Telephone Encounter (Signed)
This nurse received a call from this patient stating that she was advised at her most recent office visit that if she experiences symptoms from her gall bladder and liver surgery to reach out to this office.  She states that she has been having intermittent sharp pains, throbbing pain at times.  Today she states that she had a burning sensation that started at the surgical sight and moved around to her back.  And all this is on the right side.  She denies nausea, vomiting, fever or redness at surgical site.  This nurse advised that this will be forwarded to the MD for recommendation.  No further questions or concerns at this time.

## 2021-10-21 ENCOUNTER — Other Ambulatory Visit: Payer: Self-pay

## 2021-10-22 ENCOUNTER — Telehealth: Payer: Self-pay | Admitting: Hematology

## 2021-10-22 ENCOUNTER — Ambulatory Visit
Admission: RE | Admit: 2021-10-22 | Discharge: 2021-10-22 | Disposition: A | Discharge status: Home / Self Care | Payer: Commercial Managed Care - HMO | Source: Ambulatory Visit | Attending: Hematology | Admitting: Hematology

## 2021-10-22 DIAGNOSIS — C23 Malignant neoplasm of gallbladder: Secondary | ICD-10-CM

## 2021-10-22 MED ORDER — GADOPICLENOL 0.5 MMOL/ML IV SOLN
9.0000 mL | Freq: Once | INTRAVENOUS | Status: AC | PRN
  Administered 2021-10-22: 9 mL via INTRAVENOUS

## 2021-10-22 NOTE — Telephone Encounter (Signed)
Scheduled follow-up appointment per 9/28 los. Patient is aware.

## 2021-10-27 ENCOUNTER — Inpatient Hospital Stay: Payer: Commercial Managed Care - HMO | Admitting: Hematology

## 2021-10-27 ENCOUNTER — Inpatient Hospital Stay: Payer: Commercial Managed Care - HMO | Attending: Nurse Practitioner | Admitting: Hematology

## 2021-10-27 ENCOUNTER — Encounter: Payer: Self-pay | Admitting: Hematology

## 2021-10-27 DIAGNOSIS — C23 Malignant neoplasm of gallbladder: Secondary | ICD-10-CM | POA: Diagnosis not present

## 2021-10-27 NOTE — Progress Notes (Signed)
[place]   Telephone:(336) [phone] Fax:(336) [phone]   Clinic Follow up Note   Patient Care Team: Patient, No Pcp Per as PCP - General ([place]) Shelby Boston, MD as Consulting Physician (General Surgery) Dwan Bolt, MD as Consulting Physician (Surgical Oncology) Truitt Merle, MD as Consulting Physician (Hematology) Shelby Feeling, NP as Nurse Practitioner (Nurse Practitioner)  Date of Service:  10/27/2021  I connected with Shelby Cunningham on 10/27/2021 at 10:00 AM EDT by telephone visit and verified that I am speaking with the correct person using two identifiers.  I discussed the limitations, risks, security and privacy concerns of performing an evaluation and management service by telephone and the availability of in person appointments. I also discussed with the patient that there may be a patient responsible charge related to this service. The patient expressed understanding and agreed to proceed.   Other persons participating in the visit and their role in the encounter:  none  Patient's location:  home Provider's location:  my office  CHIEF COMPLAINT: f/u of gallbladder cancer  CURRENT THERAPY:  Surveillance  ASSESSMENT & PLAN:  Shelby Cunningham is a 36 y.o. female with   1. 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 Work up showed cholecystitis. S/p cholecystectomy on 01/14/21, pathology showed incidental finding of adenocarcinoma, margins and LNs clear.  -staging chest CT on 02/08/21 showed a nonspecific 9 mm right hilar lymph node. Otherwise, no metastatic disease. -abdomen MRI on 02/09/21 showed: hepatic steatosis, no liver lesions; 4 mm cystic lesion of tail of pancreas. -s/p resection on 02/26/21 with Dr. Zenia Cunningham, pathology was negative for malignancy.  -baseline CEA obtained on 03/19/21 was WNL. -she completed 8 cycles adjuvant Xeloda 03/29/21 -  09/05/21. She tolerated very well. -post-treatment CT AP 10/07/21 showed: indeterminate 2.7 cm subcapsular liver focus, indeterminate.  -further evaluation with abdomen MRI on 10/22/21  showed: no abnormal post-contrast enhancement to gallbladder fossa, most consistent with post-surgical change; no convincing evidence of local recurrence or metastatic disease.  -I reviewed the results with her today. She reports she recently developed nausea with vomiting 3 days ago, which has mostly resolved today. I encouraged her to reach out if this becomes more consistent or if she develops pain. -plan to repeat CT in 4-6 months    2. Brain aneurysm, 2012 -followed by neuro     PLAN: -MRI result reviewed -ab and f/u on 12/22 as scheduled, will order repeated CT on next visit    No problem-specific Assessment & Plan notes found for this encounter.   SUMMARY OF ONCOLOGIC HISTORY: Oncology History  Adenocarcinoma of gallbladder ([place])  01/14/2021 Cancer Staging   Staging form: Gallbladder, AJCC 8th Edition - Pathologic stage from 01/14/2021: Stage IIA (pT2a, pN0, cM0) - Signed by Shelby Feeling, NP on 02/17/2021 Total positive nodes: 0   01/14/2021 Imaging   ABD US IMPRESSION: Cholelithiasis with gallbladder wall thickening and positive sonographic Murphy sign suggesting acute cholecystitis. There is no pericholecystic fluid.   Mildly increased liver echogenicity likely reflects steatosis. There are 2 relative hypoechoic areas in the right hepatic lobe that may reflect fatty spurring or lesions. Recommend nonemergent evaluation with MRI   01/14/2021 Surgery   Lap cholecystectomy by Dr. Michael Cunningham    01/14/2021 Pathology Results   A. GALLBLADDER, CHOLECYSTECTOMY:  - Invasive well to moderately differentiated adenocarcinoma, 1.6 cm,  arising in association with a gallbladder adenoma with high-grade dysplasia  -  Carcinoma invades beyond the muscularis propria but not into the serosal surface  -  Resection margins are negative for carcinoma  - See oncology table   COMMENT:  About 10% of the tumor shows presence of a neuroendocrine component in the form of a high-grade neuroendocrine carcinoma.  Immunostains for synaptophysin, CD56, chromogranin and Ki-67 are confirmatory.    02/08/2021 Imaging   CT Chest IMPRESSION: 9 mm right hilar lymph node is nonspecific and may be reactive. Recommend attention on follow-up studies. Otherwise, no evidence of metastatic disease in the chest.   02/09/2021 Imaging   MRI ABD IMPRESSION: 1. Expected postsurgical changes of cholecystectomy. Normal caliber common bile duct with no evidence choledocholithiasis. 2. Hepatic steatosis with areas of focal fatty sparing. No suspicious liver lesions. 3. Small cystic lesion of the tail of the pancreas measuring 4 mm. Recommend follow up pre and post contrast MRI/MRCP or pancreatic protocol CT in 1 year. This recommendation follows ACR consensus guidelines: Management of Incidental Pancreatic Cysts:    02/17/2021 Initial Diagnosis   Adenocarcinoma of gallbladder ([place])   02/26/2021 Pathology Results   FINAL MICROSCOPIC DIAGNOSIS:   A. LIVER, CYSTIC DUCT MARGIN, EXCISION:  - Fibrosis and histiocytic infiltrate consistent with previous  procedure.  - Negative for malignancy.   B. LIVER, PARTIAL CENTRAL, HEPATECTOMY:  - Liver with focal fibrosis, histiocytic infiltrate and giant cell  reaction consistent with previous procedure.  - Negative for malignancy.   C. LYMPH NODE, PORTAL, EXCISION:  - Lymph node negative for metastatic carcinoma.   COMMENT:  The cystic duct margin specimen shows fibrosis with histiocytic  infiltrates.  No residual carcinoma is identified.   The liver specimen consist mostly of benign liver and at the gallbladder bed there is fibrosis with histiocytic infiltrates and giant cell reaction consistent with previous cholecystectomy.  No residual carcinoma is identified.    04/22/2021  Genetic Testing   Negative genetic testing on the CancerNext-Expanded+RNAinsight.  ATM c.1907A>G and DICER1 c.[id number] VUS were identified.  The report date is Aprili 6, 2023.  The CancerNext-Expanded gene panel offered by [facility] and includes sequencing and rearrangement analysis for the following 77 genes: AIP, ALK, APC*, ATM*, AXIN2, BAP1, BARD1, BLM, BMPR1A, BRCA1*, BRCA2*, BRIP1*, CDC73, CDH1*, CDK4, CDKN1B, CDKN2A, CHEK2*, CTNNA1, DICER1, FANCC, FH, FLCN, GALNT12, KIF1B, LZTR1, MAX, MEN1, MET, MLH1*, MSH2*, MSH3, MSH6*, MUTYH*, NBN, NF1*, NF2, NTHL1, PALB2*, PHOX2B, PMS2*, POT1, PRKAR1A, PTCH1, PTEN*, RAD51C*, RAD51D*, RB1, RECQL, RET, SDHA, SDHAF2, SDHB, SDHC, SDHD, SMAD4, SMARCA4, SMARCB1, SMARCE1, STK11, SUFU, TMEM127, TP53*, TSC1, TSC2, VHL and XRCC2 (sequencing and deletion/duplication); EGFR, EGLN1, HOXB13, KIT, MITF, PDGFRA, POLD1, and POLE (sequencing only); EPCAM and GREM1 (deletion/duplication only). DNA and RNA analyses performed for * genes.       INTERVAL HISTORY:  Carmaleta Youngers was contacted for a follow up of gallbladder cancer. She was last seen by me on 10/14/21. She reports she is doing well overall. She reports she experienced a day of nausea with vomiting several days ago. She explains she is unsure if this is attributed to what she ate. She notes she still has some lingering nausea but much improved.    All other systems were reviewed with the patient and are negative.  MEDICAL HISTORY:  Past Medical History:  Diagnosis Date   Aneurysm ([place])    Brain - has checked every 6 months   Cancer ([place])    Exercise-induced shortness of breath    "sometimes at the gym" (01/30/2012)   GERD (gastroesophageal reflux disease)  No pertinent past medical history    Status post vacuum-assisted vaginal delivery 11/29/2016   TIA (transient ischemic attack) 01/2012    SURGICAL HISTORY: Past Surgical History:  Procedure Laterality Date   CHOLECYSTECTOMY      LAPAROSCOPIC CHOLECYSTECTOMY SINGLE SITE WITH INTRAOPERATIVE CHOLANGIOGRAM N/A 01/14/2021   Procedure: LAPAROSCOPIC CHOLECYSTECTOMY SINGLE SITE;  Surgeon: Shelby Boston, MD;  Location: [facility];  Service: General;  Laterality: N/A;   LAPAROSCOPY N/A 02/26/2021   Procedure: STAGING LAPAROSCOPY;  Surgeon: Dwan Bolt, MD;  Location: [place];  Service: General;  Laterality: N/A;   OPEN HEPATECTOMY  N/A 02/26/2021   Procedure: OPEN PARTIAL CENTRAL HEPATECTOMY, PORTAL LYMPH NODE DISSECTION, INTRAOPERATIVE ULTRASOUND;  Surgeon: Dwan Bolt, MD;  Location: [place];  Service: General;  Laterality: N/A;   TEE WITHOUT CARDIOVERSION  01/31/2012   Procedure: TRANSESOPHAGEAL ECHOCARDIOGRAM (TEE);  Surgeon: Lelon Perla, MD;  Location: [facility] ENDOSCOPY;  Service: Cardiovascular;  Laterality: N/A;    I have reviewed the social history and family history with the patient and they are unchanged from previous note.  ALLERGIES:  has No Known Allergies.  MEDICATIONS:  Current Outpatient Medications  Medication Sig Dispense Refill   capecitabine (XELODA) 500 MG tablet Take 4 tablets (2,000 mg total) by mouth 2 (two) times daily after a meal. Take for 14 days then off for 7 days 112 tablet 1   ondansetron (ZOFRAN-ODT) 4 MG disintegrating tablet Take 1 tablet (4 mg total) by mouth every 6 (six) hours as needed for nausea. 20 tablet 0   No current facility-administered medications for this visit.    PHYSICAL EXAMINATION: ECOG PERFORMANCE STATUS: 1 - Symptomatic but completely ambulatory  There were no vitals filed for this visit. Wt Readings from Last 3 Encounters:  10/14/21 202 lb 3.2 oz (91.7 kg)  08/19/21 196 lb 1 oz (88.9 kg)  07/13/21 196 lb 1.6 oz (89 kg)     No vitals taken today, Exam not performed today  LABORATORY DATA:  I have reviewed the data as listed    Latest Ref Rng & Units 09/21/2021    8:08 AM 08/19/2021    7:52 AM 07/13/2021    7:38 AM  CBC  WBC 4.0 - 10.5 K/uL 7.1  7.7  8.5    Hemoglobin 12.0 - 15.0 g/dL 14.4  14.4  14.4   Hematocrit 36.0 - 46.0 % 41.9  41.5  41.7   Platelets 150 - 400 K/uL 275  245  254         Latest Ref Rng & Units 09/21/2021    8:08 AM 08/19/2021    7:52 AM 07/13/2021    7:38 AM  CMP  Glucose 70 - 99 mg/dL 95  98  89   BUN 6 - 20 mg/dL '10  10  10   ' Creatinine 0.44 - 1.00 mg/dL 0.79  0.79  0.77   Sodium 135 - 145 mmol/L 139  140  138   Potassium 3.5 - 5.1 mmol/L 4.0  3.7  3.8   Chloride 98 - 111 mmol/L 107  110  108   CO2 22 - 32 mmol/L '25  22  24   ' Calcium 8.9 - 10.3 mg/dL 9.4  8.9  9.1   Total Protein 6.5 - 8.1 g/dL 7.5  7.5  7.4   Total Bilirubin 0.3 - 1.2 mg/dL 0.4  0.5  0.7   Alkaline Phos 38 - 126 U/L 106  96  99   AST 15 - 41 U/L 30  26  30   ALT 0 - 44 U/L 35  31  35       RADIOGRAPHIC STUDIES: I have personally reviewed the radiological images as listed and agreed with the findings in the report. No results found.    No orders of the defined types were placed in this encounter.  All questions were answered. The patient knows to call the clinic with any problems, questions or concerns. No barriers to learning was detected. The total time spent in the appointment was 15 minutes.     Truitt Merle, MD 10/27/2021   I, Wilburn Mylar, am acting as scribe for Truitt Merle, MD.   I have reviewed the above documentation for accuracy and completeness, and I agree with the above.

## 2021-10-29 ENCOUNTER — Ambulatory Visit: Payer: Commercial Managed Care - HMO | Admitting: Hematology

## 2022-01-07 ENCOUNTER — Inpatient Hospital Stay: Payer: Commercial Managed Care - HMO | Attending: Nurse Practitioner | Admitting: Hematology

## 2022-01-07 ENCOUNTER — Inpatient Hospital Stay: Payer: Commercial Managed Care - HMO

## 2022-01-07 VITALS — BP 136/89 | HR 90 | Temp 98.4°F | Resp 18 | Wt 197.9 lb

## 2022-01-07 DIAGNOSIS — C23 Malignant neoplasm of gallbladder: Secondary | ICD-10-CM

## 2022-01-07 DIAGNOSIS — K219 Gastro-esophageal reflux disease without esophagitis: Secondary | ICD-10-CM | POA: Diagnosis not present

## 2022-01-07 LAB — CBC WITH DIFFERENTIAL/PLATELET
Abs Immature Granulocytes: 0.01 10*3/uL (ref 0.00–0.07)
Basophils Absolute: 0.1 10*3/uL (ref 0.0–0.1)
Basophils Relative: 1 %
Eosinophils Absolute: 0.4 10*3/uL (ref 0.0–0.5)
Eosinophils Relative: 4 %
HCT: 42.1 % (ref 36.0–46.0)
Hemoglobin: 14.4 g/dL (ref 12.0–15.0)
Immature Granulocytes: 0 %
Lymphocytes Relative: 33 %
Lymphs Abs: 2.8 10*3/uL (ref 0.7–4.0)
MCH: 27.3 pg (ref 26.0–34.0)
MCHC: 34.2 g/dL (ref 30.0–36.0)
MCV: 79.9 fL — ABNORMAL LOW (ref 80.0–100.0)
Monocytes Absolute: 0.4 10*3/uL (ref 0.1–1.0)
Monocytes Relative: 5 %
Neutro Abs: 5 10*3/uL (ref 1.7–7.7)
Neutrophils Relative %: 57 %
Platelets: 304 10*3/uL (ref 150–400)
RBC: 5.27 MIL/uL — ABNORMAL HIGH (ref 3.87–5.11)
RDW: 11.9 % (ref 11.5–15.5)
WBC: 8.7 10*3/uL (ref 4.0–10.5)
nRBC: 0 % (ref 0.0–0.2)

## 2022-01-07 LAB — COMPREHENSIVE METABOLIC PANEL
ALT: 43 U/L (ref 0–44)
AST: 36 U/L (ref 15–41)
Albumin: 4.2 g/dL (ref 3.5–5.0)
Alkaline Phosphatase: 123 U/L (ref 38–126)
Anion gap: 8 (ref 5–15)
BUN: 10 mg/dL (ref 6–20)
CO2: 25 mmol/L (ref 22–32)
Calcium: 9.5 mg/dL (ref 8.9–10.3)
Chloride: 106 mmol/L (ref 98–111)
Creatinine, Ser: 0.74 mg/dL (ref 0.44–1.00)
GFR, Estimated: >60 mL/min (ref >60)
Glucose, Bld: 107 mg/dL — ABNORMAL HIGH (ref 70–99)
Potassium: 3.5 mmol/L (ref 3.5–5.1)
Sodium: 139 mmol/L (ref 135–145)
Total Bilirubin: 0.4 mg/dL (ref 0.3–1.2)
Total Protein: 7.6 g/dL (ref 6.5–8.1)

## 2022-01-07 NOTE — Progress Notes (Unsigned)
[place]   [phone] Fax:(336) [phone]   Clinic Follow up Note   Patient Care Team: Patient, No Pcp Per as PCP - General (General Practice) Michael Boston, MD as Consulting Physician (General Surgery) Dwan Bolt, MD as Consulting Physician (Surgical Oncology) Truitt Merle, MD as Consulting Physician (Hematology) Alla Feeling, NP as Nurse Practitioner (Nurse Practitioner)  Date of Service:  01/07/2022  CHIEF COMPLAINT: f/u of  gallbladder cancer   CURRENT THERAPY:  Surveillance   ASSESSMENT:  Shelby Cunningham is a 36 y.o. 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  -s/p resection on 02/26/21 with Dr. Zenia Resides, pathology was negative for malignancy.  -baseline CEA obtained on 03/19/21 was WNL. -she completed 8 cycles adjuvant Xeloda 03/29/21 - 09/05/21. She tolerated very well. -on surveillance .  She is clinically doing well, reports symptom of acid reflux, otherwise no concerns. -Plan to repeat a CT scan in 3 months.  PLAN: - Lab reviewed -normal -order CT Scan to be done before next OV  -Lab,f/u in 39mhs  SUMMARY OF ONCOLOGIC HISTORY: Oncology History  [place]  01/14/2021 Cancer Staging   Staging form: Gallbladder, AJCC 8th Edition - Pathologic stage from 01/14/2021: Stage IIA (pT2a, pN0, cM0) - Signed by BAlla Feeling NP on 02/17/2021 Total positive nodes: 0   01/14/2021 Imaging   ABD UKoreaIMPRESSION: Cholelithiasis with gallbladder wall thickening and positive sonographic Murphy sign suggesting acute cholecystitis. There is no pericholecystic fluid.   Mildly increased liver echogenicity likely reflects steatosis. There are 2 relative hypoechoic areas in the right hepatic lobe that may reflect fatty spurring or lesions. Recommend nonemergent evaluation with MRI   01/14/2021  Surgery   Lap cholecystectomy by Dr. SMichael Boston   01/14/2021 Pathology Results   A. GALLBLADDER, CHOLECYSTECTOMY:  - Invasive well to moderately differentiated adenocarcinoma, 1.6 cm,  arising in association with a gallbladder adenoma with high-grade dysplasia  - Carcinoma invades beyond the muscularis propria but not into the serosal surface  - Resection margins are negative for carcinoma  - See oncology table   COMMENT:  About 10% of the tumor shows presence of a neuroendocrine component in the form of a high-grade neuroendocrine carcinoma.  Immunostains for synaptophysin, CD56, chromogranin and Ki-67 are confirmatory.    02/08/2021 Imaging   CT Chest IMPRESSION: 9 mm right hilar lymph node is nonspecific and may be reactive. Recommend attention on follow-up studies. Otherwise, no evidence of metastatic disease in the chest.   02/09/2021 Imaging   MRI ABD IMPRESSION: 1. Expected postsurgical changes of cholecystectomy. Normal caliber common bile duct with no evidence choledocholithiasis. 2. Hepatic steatosis with areas of focal fatty sparing. No suspicious liver lesions. 3. Small cystic lesion of the tail of the pancreas measuring 4 mm. Recommend follow up pre and post contrast MRI/MRCP or pancreatic protocol CT in 1 year. This recommendation follows ACR consensus guidelines: Management of Incidental Pancreatic Cysts:    02/17/2021 Initial Diagnosis   [place]   02/26/2021 Pathology Results   FINAL MICROSCOPIC DIAGNOSIS:   A. LIVER, CYSTIC DUCT MARGIN, EXCISION:  - Fibrosis and histiocytic infiltrate consistent with previous  procedure.  - Negative for malignancy.   B. LIVER, PARTIAL CENTRAL, HEPATECTOMY:  - Liver with focal fibrosis, histiocytic infiltrate and giant cell  reaction consistent with previous procedure.  - Negative for malignancy.   C. LYMPH  NODE, PORTAL, EXCISION:  - Lymph node negative for metastatic carcinoma.   COMMENT:  The  cystic duct margin specimen shows fibrosis with histiocytic  infiltrates.  No residual carcinoma is identified.   The liver specimen consist mostly of benign liver and at the gallbladder bed there is fibrosis with histiocytic infiltrates and giant cell reaction consistent with previous cholecystectomy.  No residual carcinoma is identified.    04/22/2021 Genetic Testing   Negative genetic testing on the CancerNext-Expanded+RNAinsight.  ATM c.1907A>G and DICER1 [id number] VUS were identified.  The report date is Aprili 6, 2023.  The CancerNext-Expanded gene panel offered by [facility] and includes sequencing and rearrangement analysis for the following 77 genes: AIP, ALK, APC*, ATM*, AXIN2, BAP1, BARD1, BLM, BMPR1A, BRCA1*, BRCA2*, BRIP1*, CDC73, CDH1*, CDK4, CDKN1B, CDKN2A, CHEK2*, CTNNA1, DICER1, FANCC, FH, FLCN, GALNT12, KIF1B, LZTR1, MAX, MEN1, MET, MLH1*, MSH2*, MSH3, MSH6*, MUTYH*, NBN, NF1*, NF2, NTHL1, PALB2*, PHOX2B, PMS2*, POT1, PRKAR1A, PTCH1, PTEN*, RAD51C*, RAD51D*, RB1, RECQL, RET, SDHA, SDHAF2, SDHB, SDHC, SDHD, SMAD4, SMARCA4, SMARCB1, SMARCE1, STK11, SUFU, TMEM127, TP53*, TSC1, TSC2, VHL and XRCC2 (sequencing and deletion/duplication); EGFR, EGLN1, HOXB13, KIT, MITF, PDGFRA, POLD1, and POLE (sequencing only); EPCAM and GREM1 (deletion/duplication only). DNA and RNA analyses performed for * genes.       INTERVAL HISTORY:  Shelby Cunningham is here for a follow up of  gallbladder cancer  She was last seen by me on 9/28/023 She presents to the clinic alone. Pt reports of having acid reflux after she eats.Pt ave tries OTC Tums and Pepcid. BM are good. Pt states appetite is good.    All other systems were reviewed with the patient and are negative.  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   Cancer ([place])    Exercise-induced shortness of breath    "sometimes at the gym" (01/30/2012)   GERD (gastroesophageal  reflux disease)    No pertinent past medical history    Status post vacuum-assisted vaginal delivery 11/29/2016   TIA (transient ischemic attack) 01/2012    SURGICAL HISTORY: Past Surgical History:  Procedure Laterality Date   CHOLECYSTECTOMY     LAPAROSCOPIC CHOLECYSTECTOMY SINGLE SITE WITH INTRAOPERATIVE CHOLANGIOGRAM N/A 01/14/2021   Procedure: LAPAROSCOPIC CHOLECYSTECTOMY SINGLE SITE;  Surgeon: Michael Boston, MD;  Location: [facility];  Service: General;  Laterality: N/A;   LAPAROSCOPY N/A 02/26/2021   Procedure: STAGING LAPAROSCOPY;  Surgeon: Dwan Bolt, MD;  Location: [place];  Service: General;  Laterality: N/A;   OPEN HEPATECTOMY  N/A 02/26/2021   Procedure: OPEN PARTIAL CENTRAL HEPATECTOMY, PORTAL LYMPH NODE DISSECTION, INTRAOPERATIVE ULTRASOUND;  Surgeon: Dwan Bolt, MD;  Location: [place];  Service: General;  Laterality: N/A;   TEE WITHOUT CARDIOVERSION  01/31/2012   Procedure: TRANSESOPHAGEAL ECHOCARDIOGRAM (TEE);  Surgeon: Lelon Perla, MD;  Location[facility] END[facility]COPY;  Service: Cardiovascular;  Laterality: N/A;    I have reviewed the social history and family history with the patient and they are unchanged from previous note.  ALLERGIES:  has No Known Allergies.  MEDICATIONS:  No current outpatient medications on file.   No current facility-administered medications for this visit.    PHYSICAL EXAMINATION: ECOG PERFORMANCE STATUS: 0 - Asymptomatic  Vitals:   01/07/22 1446  BP: 136/89  Pulse: 90  Resp: 18  Temp: 98.4 F (36.9 C)  SpO2: 100%   Wt Readings from Last 3 Encounters:  01/07/22 197 lb 14.4 oz (89.8 kg)  10/14/21 202 lb 3.2 oz (91.7 kg)  08/19/21 196 lb 1 oz (88.9 kg)    GENERAL:alert, no distress and comfortable SKIN: skin color, texture, turgor are normal, no rashes or significant lesions EYES: normal, Conjunctiva are pink and non-injected, sclera clear  NECK: (-) supple, thyroid normal size, non-tender, without nodularity LYMPH:(-)  no  palpable lymphadenopathy in the cervical, axillary  LUNGS: (-)clear to auscultation and percussion with normal breathing effort HEART:(-)  regular rate & rhythm and no murmurs and no lower extremity edema ABDOMEN:(-) abdomen soft, non-tender and normal bowel sounds Liver not enlarge. Musculoskeletal:no cyanosis of digits and no clubbing  NEURO: alert & oriented x 3 with fluent speech, no focal motor/sensory deficits  LABORATORY DATA:  I have reviewed the data as listed    Latest Ref Rng & Units 01/07/2022    2:30 PM 09/21/2021    8:08 AM 08/19/2021    7:52 AM  CBC  WBC 4.0 - 10.5 K/uL 8.7  7.1  7.7   Hemoglobin 12.0 - 15.0 g/dL 14.4  14.4  14.4   Hematocrit 36.0 - 46.0 % 42.1  41.9  41.5   Platelets 150 - 400 K/uL 304  275  245         Latest Ref Rng & Units 09/21/2021    8:08 AM 08/19/2021    7:52 AM 07/13/2021    7:38 AM  CMP  Glucose 70 - 99 mg/dL 95  98  89   BUN 6 - 20 mg/dL _0 Creatinine 0.44 - 1.00 mg/dL 0.79  0.79  0.77   Sodium 135 - 145 mmol/L 139  140  138   Potassium 3.5 - 5.1 mmol/L 4.0  3.7  3.8   Chloride 98 - 111 mmol/L 107  110  108   CO2 22 - 32 mmol/L _1 Calcium 8.9 - 10.3 mg/dL 9.4  8.9  9.1   Total Protein 6.5 - 8.1 g/dL 7.5  7.5  7.4   Total Bilirubin 0.3 - 1.2 mg/dL 0.4  0.5  0.7   Alkaline Phos 38 - 126 U/L 106  96  99   AST 15 - 41 U/L _2 ALT 0 - 44 U/L 35  31  35       RADIOGRAPHIC STUDIES: I have personally reviewed the radiological images as listed and agreed with the findings in the report. No results found.    Orders Placed This Encounter  Procedures   CT CHEST ABDOMEN PELVIS W CONTRAST    Standing Status:   Future    Standing Expiration Date:   01/08/2023    Order Specific Question:   Is patient pregnant?    Answer:   No    Order Specific Question:   Preferred imaging location?    Answer:   [facility]    Order Specific Question:   Is Oral Contrast requested for this exam?    Answer:   Yes, Per  Radiology protocol   All questions were answered. The patient knows to call the clinic with any problems, questions or concerns. No barriers to learning was detected. The total time spent in the appointment was 30 minutes.     Truitt Merle, MD 01/07/2022   Felicity Coyer, CMA, am acting as scribe for Truitt Merle, MD.   I have reviewed the above documentation for accuracy and completeness, and I agree with the above.

## 2022-01-07 NOTE — Assessment & Plan Note (Signed)
pT2aN0M0, stage IIA -S/p cholecystectomy on 01/14/21, pathology showed incidental finding of adenocarcinoma, margins and LNs clear.  -s/p resection on 02/26/21 with Dr. Zenia Resides, pathology was negative for malignancy.  -baseline CEA obtained on 03/19/21 was WNL. -she completed 8 cycles adjuvant Xeloda 03/29/21 - 09/05/21. She tolerated very well. -on surveillance

## 2022-01-08 ENCOUNTER — Encounter: Payer: Self-pay | Admitting: Hematology

## 2022-01-08 LAB — CANCER ANTIGEN 19-9: CA 19-9: 18 U/mL (ref 0–35)

## 2022-01-12 ENCOUNTER — Telehealth: Payer: Self-pay | Admitting: Hematology

## 2022-01-12 NOTE — Telephone Encounter (Signed)
Left patient a voicemail regarding upcoming appointments  

## 2022-02-17 DIAGNOSIS — Z419 Encounter for procedure for purposes other than remedying health state, unspecified: Secondary | ICD-10-CM | POA: Diagnosis not present

## 2022-03-18 DIAGNOSIS — Z419 Encounter for procedure for purposes other than remedying health state, unspecified: Secondary | ICD-10-CM | POA: Diagnosis not present

## 2022-04-04 ENCOUNTER — Ambulatory Visit (HOSPITAL_COMMUNITY)
Admission: RE | Admit: 2022-04-04 | Discharge: 2022-04-04 | Disposition: A | Discharge status: Home / Self Care | Payer: Commercial Managed Care - HMO | Source: Ambulatory Visit | Attending: Hematology | Admitting: Hematology

## 2022-04-04 ENCOUNTER — Inpatient Hospital Stay: Payer: Commercial Managed Care - HMO | Attending: Nurse Practitioner

## 2022-04-04 DIAGNOSIS — C23 Malignant neoplasm of gallbladder: Secondary | ICD-10-CM

## 2022-04-04 DIAGNOSIS — K760 Fatty (change of) liver, not elsewhere classified: Secondary | ICD-10-CM | POA: Diagnosis not present

## 2022-04-04 LAB — CBC WITH DIFFERENTIAL/PLATELET
Abs Immature Granulocytes: 0.01 10*3/uL (ref 0.00–0.07)
Basophils Absolute: 0.1 10*3/uL (ref 0.0–0.1)
Basophils Relative: 1 %
Eosinophils Absolute: 0.3 10*3/uL (ref 0.0–0.5)
Eosinophils Relative: 4 %
HCT: 42.4 % (ref 36.0–46.0)
Hemoglobin: 14.5 g/dL (ref 12.0–15.0)
Immature Granulocytes: 0 %
Lymphocytes Relative: 25 %
Lymphs Abs: 2.1 10*3/uL (ref 0.7–4.0)
MCH: 27.7 pg (ref 26.0–34.0)
MCHC: 34.2 g/dL (ref 30.0–36.0)
MCV: 81.1 fL (ref 80.0–100.0)
Monocytes Absolute: 0.6 10*3/uL (ref 0.1–1.0)
Monocytes Relative: 7 %
Neutro Abs: 5.4 10*3/uL (ref 1.7–7.7)
Neutrophils Relative %: 63 %
Platelets: 299 10*3/uL (ref 150–400)
RBC: 5.23 MIL/uL — ABNORMAL HIGH (ref 3.87–5.11)
RDW: 12.4 % (ref 11.5–15.5)
WBC: 8.4 10*3/uL (ref 4.0–10.5)
nRBC: 0 % (ref 0.0–0.2)

## 2022-04-04 LAB — COMPREHENSIVE METABOLIC PANEL
ALT: 48 U/L — ABNORMAL HIGH (ref 0–44)
AST: 37 U/L (ref 15–41)
Albumin: 4.2 g/dL (ref 3.5–5.0)
Alkaline Phosphatase: 127 U/L — ABNORMAL HIGH (ref 38–126)
Anion gap: 6 (ref 5–15)
BUN: 9 mg/dL (ref 6–20)
CO2: 25 mmol/L (ref 22–32)
Calcium: 8.9 mg/dL (ref 8.9–10.3)
Chloride: 107 mmol/L (ref 98–111)
Creatinine, Ser: 0.71 mg/dL (ref 0.44–1.00)
GFR, Estimated: >60 mL/min (ref >60)
Glucose, Bld: 92 mg/dL (ref 70–99)
Potassium: 3.6 mmol/L (ref 3.5–5.1)
Sodium: 138 mmol/L (ref 135–145)
Total Bilirubin: 0.4 mg/dL (ref 0.3–1.2)
Total Protein: 7.4 g/dL (ref 6.5–8.1)

## 2022-04-04 MED ORDER — IOHEXOL 300 MG/ML  SOLN
100.0000 mL | Freq: Once | INTRAMUSCULAR | Status: AC | PRN
  Administered 2022-04-04: 100 mL via INTRAVENOUS

## 2022-04-05 LAB — CANCER ANTIGEN 19-9: CA 19-9: 18 U/mL (ref 0–35)

## 2022-04-07 NOTE — Assessment & Plan Note (Deleted)
pT2aN0M0, stage IIA -S/p cholecystectomy on 01/14/21, pathology showed incidental finding of adenocarcinoma, margins and LNs clear.  -s/p resection on 02/26/21 with Dr. Zenia Resides, pathology was negative for malignancy.  -baseline CEA obtained on 03/19/21 was WNL. -she completed 8 cycles adjuvant Xeloda 03/29/21 - 09/05/21. She tolerated very well. -on surveillance .  She is clinically doing well, reports symptom of acid reflux, otherwise no concerns. -I personally reviewed her surveillance CT scan from April 04, 2022, which showed no evidence of cancer recurrence, except surgical changes and improved cystic lesion at the surgical site.

## 2022-04-08 ENCOUNTER — Inpatient Hospital Stay: Payer: Commercial Managed Care - HMO | Admitting: Hematology

## 2022-04-08 DIAGNOSIS — C23 Malignant neoplasm of gallbladder: Secondary | ICD-10-CM

## 2022-04-08 NOTE — Progress Notes (Deleted)
[place]   Telephone:(336) [phone] Fax:(336) [phone]   Clinic Follow up Note   Patient Care Team: Patient, No Pcp Per as PCP - General ([place]) Michael Boston, MD as Consulting Physician (General Surgery) Dwan Bolt, MD as Consulting Physician (Surgical Oncology) Truitt Merle, MD as Consulting Physician (Hematology) Alla Feeling, NP as Nurse Practitioner (Nurse Practitioner)  Date of Service:  04/08/2022  CHIEF COMPLAINT: f/u of gallbladder cancer   CURRENT THERAPY:  Surveillance   ASSESSMENT: *** Shelby Cunningham is a 37 y.o. female with   No problem-specific Assessment & Plan notes found for this encounter.  ***   PLAN:   SUMMARY OF ONCOLOGIC HISTORY: Oncology History  Adenocarcinoma of gallbladder ([place])  01/14/2021 Cancer Staging   Staging form: Gallbladder, AJCC 8th Edition - Pathologic stage from 01/14/2021: Stage IIA (pT2a, pN0, cM0) - Signed by Alla Feeling, NP on 02/17/2021 Total positive nodes: 0   01/14/2021 Imaging   ABD US IMPRESSION: Cholelithiasis with gallbladder wall thickening and positive sonographic Murphy sign suggesting acute cholecystitis. There is no pericholecystic fluid.   Mildly increased liver echogenicity likely reflects steatosis. There are 2 relative hypoechoic areas in the right hepatic lobe that may reflect fatty spurring or lesions. Recommend nonemergent evaluation with MRI   01/14/2021 Surgery   Lap cholecystectomy by Dr. Michael Boston    01/14/2021 Pathology Results   A. GALLBLADDER, CHOLECYSTECTOMY:  - Invasive well to moderately differentiated adenocarcinoma, 1.6 cm,  arising in association with a gallbladder adenoma with high-grade dysplasia  - Carcinoma invades beyond the muscularis propria but not into the serosal surface  - Resection margins are negative for carcinoma  - See oncology table   COMMENT:  About 10% of the tumor shows presence of a neuroendocrine component in the form of a  high-grade neuroendocrine carcinoma.  Immunostains for synaptophysin, CD56, chromogranin and Ki-67 are confirmatory.    02/08/2021 Imaging   CT Chest IMPRESSION: 9 mm right hilar lymph node is nonspecific and may be reactive. Recommend attention on follow-up studies. Otherwise, no evidence of metastatic disease in the chest.   02/09/2021 Imaging   MRI ABD IMPRESSION: 1. Expected postsurgical changes of cholecystectomy. Normal caliber common bile duct with no evidence choledocholithiasis. 2. Hepatic steatosis with areas of focal fatty sparing. No suspicious liver lesions. 3. Small cystic lesion of the tail of the pancreas measuring 4 mm. Recommend follow up pre and post contrast MRI/MRCP or pancreatic protocol CT in 1 year. This recommendation follows ACR consensus guidelines: Management of Incidental Pancreatic Cysts:    02/17/2021 Initial Diagnosis   Adenocarcinoma of gallbladder ([place])   02/26/2021 Pathology Results   FINAL MICROSCOPIC DIAGNOSIS:   A. LIVER, CYSTIC DUCT MARGIN, EXCISION:  - Fibrosis and histiocytic infiltrate consistent with previous  procedure.  - Negative for malignancy.   B. LIVER, PARTIAL CENTRAL, HEPATECTOMY:  - Liver with focal fibrosis, histiocytic infiltrate and giant cell  reaction consistent with previous procedure.  - Negative for malignancy.   C. LYMPH NODE, PORTAL, EXCISION:  - Lymph node negative for metastatic carcinoma.   COMMENT:  The cystic duct margin specimen shows fibrosis with histiocytic  infiltrates.  No residual carcinoma is identified.   The liver specimen consist mostly of benign liver and at the gallbladder bed there is fibrosis with histiocytic infiltrates and giant cell reaction consistent with previous cholecystectomy.  No residual carcinoma is identified.    04/22/2021 Genetic Testing   Negative genetic testing on the CancerNext-Expanded+RNAinsight.  ATM c.1907A>G and  DICER1 [id number] VUS were identified.  The report date is  Aprili 6, 2023.  The CancerNext-Expanded gene panel offered by [facility] and includes sequencing and rearrangement analysis for the following 77 genes: AIP, ALK, APC*, ATM*, AXIN2, BAP1, BARD1, BLM, BMPR1A, BRCA1*, BRCA2*, BRIP1*, CDC73, CDH1*, CDK4, CDKN1B, CDKN2A, CHEK2*, CTNNA1, DICER1, FANCC, FH, FLCN, GALNT12, KIF1B, LZTR1, MAX, MEN1, MET, MLH1*, MSH2*, MSH3, MSH6*, MUTYH*, NBN, NF1*, NF2, NTHL1, PALB2*, PHOX2B, PMS2*, POT1, PRKAR1A, PTCH1, PTEN*, RAD51C*, RAD51D*, RB1, RECQL, RET, SDHA, SDHAF2, SDHB, SDHC, SDHD, SMAD4, SMARCA4, SMARCB1, SMARCE1, STK11, SUFU, TMEM127, TP53*, TSC1, TSC2, VHL and XRCC2 (sequencing and deletion/duplication); EGFR, EGLN1, HOXB13, KIT, MITF, PDGFRA, POLD1, and POLE (sequencing only); EPCAM and GREM1 (deletion/duplication only). DNA and RNA analyses performed for * genes.       INTERVAL HISTORY: *** Shelby Cunningham is here for a follow up of gallbladder cancer  She was last seen by me on 01/07/2022 She presents to the clinic    All other systems were reviewed with the patient and are negative.  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   Cancer ([place])    Exercise-induced shortness of breath    "sometimes at the gym" (01/30/2012)   GERD (gastroesophageal reflux disease)    No pertinent past medical history    Status post vacuum-assisted vaginal delivery 11/29/2016   TIA (transient ischemic attack) 01/2012    SURGICAL HISTORY: Past Surgical History:  Procedure Laterality Date   CHOLECYSTECTOMY     LAPAROSCOPIC CHOLECYSTECTOMY SINGLE SITE WITH INTRAOPERATIVE CHOLANGIOGRAM N/A 01/14/2021   Procedure: LAPAROSCOPIC CHOLECYSTECTOMY SINGLE SITE;  Surgeon: Michael Boston, MD;  Location: [facility];  Service: General;  Laterality: N/A;   LAPAROSCOPY N/A 02/26/2021   Procedure: STAGING LAPAROSCOPY;  Surgeon: Dwan Bolt, MD;  Location: [place];  Service: General;  Laterality: N/A;   OPEN  HEPATECTOMY  N/A 02/26/2021   Procedure: OPEN PARTIAL CENTRAL HEPATECTOMY, PORTAL LYMPH NODE DISSECTION, INTRAOPERATIVE ULTRASOUND;  Surgeon: Dwan Bolt, MD;  Location: [place];  Service: General;  Laterality: N/A;   TEE WITHOUT CARDIOVERSION  01/31/2012   Procedure: TRANSESOPHAGEAL ECHOCARDIOGRAM (TEE);  Surgeon: Lelon Perla, MD;  Location: [facility] ENDOSCOPY;  Service: Cardiovascular;  Laterality: N/A;    I have reviewed the social history and family history with the patient and they are unchanged from previous note.  ALLERGIES:  has No Known Allergies.  MEDICATIONS:  No current outpatient medications on file.   No current facility-administered medications for this visit.    PHYSICAL EXAMINATION: ECOG PERFORMANCE STATUS: {CHL ONC ECOG PS:[phone]}  There were no vitals filed for this visit. Wt Readings from Last 3 Encounters:  01/07/22 197 lb 14.4 oz (89.8 kg)  10/14/21 202 lb 3.2 oz (91.7 kg)  08/19/21 196 lb 1 oz (88.9 kg)    {Only keep what was examined. If exam not performed, can use .CEXAM } GENERAL:alert, no distress and comfortable SKIN: skin color, texture, turgor are normal, no rashes or significant lesions EYES: normal, Conjunctiva are pink and non-injected, sclera clear {OROPHARYNX:no exudate, no erythema and lips, buccal mucosa, and tongue normal}  NECK: supple, thyroid normal size, non-tender, without nodularity LYMPH:  no palpable lymphadenopathy in the cervical, axillary {or inguinal} LUNGS: clear to auscultation and percussion with normal breathing effort HEART: regular rate & rhythm and no murmurs and no lower extremity edema ABDOMEN:abdomen soft, non-tender and normal bowel sounds Musculoskeletal:no cyanosis of digits and no clubbing  NEURO: alert & oriented x 3 with fluent speech, no focal motor/sensory deficits  LABORATORY DATA:  I have reviewed the data as listed    Latest Ref Rng & Units 04/04/2022    1:47 PM 01/07/2022    2:30 PM 09/21/2021    8:08  AM  CBC  WBC 4.0 - 10.5 K/uL 8.4  8.7  7.1   Hemoglobin 12.0 - 15.0 g/dL 14.5  14.4  14.4   Hematocrit 36.0 - 46.0 % 42.4  42.1  41.9   Platelets 150 - 400 K/uL 299  304  275         Latest Ref Rng & Units 04/04/2022    1:47 PM 01/07/2022    2:30 PM 09/21/2021    8:08 AM  CMP  Glucose 70 - 99 mg/dL 92  107  95   BUN 6 - 20 mg/dL 9  10  10    Creatinine 0.44 - 1.00 mg/dL 0.71  0.74  0.79   Sodium 135 - 145 mmol/L 138  139  139   Potassium 3.5 - 5.1 mmol/L 3.6  3.5  4.0   Chloride 98 - 111 mmol/L 107  106  107   CO2 22 - 32 mmol/L 25  25  25    Calcium 8.9 - 10.3 mg/dL 8.9  9.5  9.4   Total Protein 6.5 - 8.1 g/dL 7.4  7.6  7.5   Total Bilirubin 0.3 - 1.2 mg/dL 0.4  0.4  0.4   Alkaline Phos 38 - 126 U/L 127  123  106   AST 15 - 41 U/L 37  36  30   ALT 0 - 44 U/L 48  43  35       RADIOGRAPHIC STUDIES: I have personally reviewed the radiological images as listed and agreed with the findings in the report. No results found.    No orders of the defined types were placed in this encounter.  All questions were answered. The patient knows to call the clinic with any problems, questions or concerns. No barriers to learning was detected. The total time spent in the appointment was {CHL ONC TIME VISIT - [id number].     Baldemar Friday, CMA 04/08/2022   I, Audry Riles, CMA, am acting as scribe for Truitt Merle, MD.   {Add scribe attestation statement}

## 2022-04-18 DIAGNOSIS — Z419 Encounter for procedure for purposes other than remedying health state, unspecified: Secondary | ICD-10-CM | POA: Diagnosis not present

## 2022-04-20 ENCOUNTER — Encounter: Payer: Self-pay | Admitting: Hematology

## 2022-04-20 ENCOUNTER — Inpatient Hospital Stay: Payer: Commercial Managed Care - HMO | Attending: Nurse Practitioner | Admitting: Hematology

## 2022-04-20 VITALS — BP 134/77 | HR 87 | Temp 98.7°F | Resp 16 | Wt 198.1 lb

## 2022-04-20 DIAGNOSIS — Z9221 Personal history of antineoplastic chemotherapy: Secondary | ICD-10-CM | POA: Insufficient documentation

## 2022-04-20 DIAGNOSIS — C23 Malignant neoplasm of gallbladder: Secondary | ICD-10-CM | POA: Diagnosis not present

## 2022-04-20 NOTE — Assessment & Plan Note (Signed)
pT2aN0M0, stage IIA -S/p cholecystectomy on 01/14/21, pathology showed incidental finding of adenocarcinoma, margins and LNs clear.  -s/p resection on 02/26/21 with Dr. Zenia Resides, pathology was negative for malignancy.  -baseline CEA obtained on 03/19/21 was WNL. -she completed 8 cycles adjuvant Xeloda 03/29/21 - 09/05/21. She tolerated very well. -on surveillance .  She is clinically doing well, reports symptom of acid reflux, otherwise no concerns. -I personally reviewed her CT scan from 04/04/2022 showed NED, and decreased size of the hypodense subcapsular focus in segment V along the gallbladder fossa adjacent to surgical clips, favored to reflect post surgical change. I discussed with pt. She is clinically doing well  -continue cancer surveillance

## 2022-04-20 NOTE — Progress Notes (Signed)
[facility]   Telephone:(336) [phone] Fax:(336) [phone]   Clinic Follow up Note   Patient Care Team: Patient, No Pcp Per as PCP - General ([place]) Michael Boston, MD as Consulting Physician (General Surgery) Dwan Bolt, MD as Consulting Physician (Surgical Oncology) Truitt Merle, MD as Consulting Physician (Hematology) Alla Feeling, NP as Nurse Practitioner (Nurse Practitioner)  Date of Service:  04/20/2022  CHIEF COMPLAINT: f/u of gallbladder cancer     CURRENT THERAPY: Surveillance    ASSESSMENT:  [facility] is a 37 y.o. 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  -s/p resection on 02/26/21 with Dr. Zenia Resides, pathology was negative for malignancy.  -baseline CEA obtained on 03/19/21 was WNL. -she completed 8 cycles adjuvant Xeloda 03/29/21 - 09/05/21. She tolerated very well. -on surveillance .  She is clinically doing well, reports symptom of acid reflux, otherwise no concerns. -I personally reviewed her CT scan from 04/04/2022 showed NED, and decreased size of the hypodense subcapsular focus in segment V along the gallbladder fossa adjacent to surgical clips, favored to reflect post surgical change. I discussed with pt. She is clinically doing well  -continue cancer surveillance     PLAN: -Discuss lab and CT scan with pt and she is clinically doing well. -Continue cancer surveillance -will Repeat CT in 8 months. -lab and f/u in 4 months.     SUMMARY OF ONCOLOGIC HISTORY: Oncology History Overview Note   Cancer Staging  Adenocarcinoma of gallbladder [facility]) Staging form: Gallbladder, AJCC 8th Edition - Pathologic stage from 01/14/2021: Stage IIA (pT2a, pN0, cM0) - Signed by Alla Feeling, NP on 02/17/2021 Total positive nodes: 0     Adenocarcinoma of gallbladder  01/14/2021 Cancer Staging   Staging  form: Gallbladder, AJCC 8th Edition - Pathologic stage from 01/14/2021: Stage IIA (pT2a, pN0, cM0) - Signed by Alla Feeling, NP on 02/17/2021 Total positive nodes: 0   01/14/2021 Imaging   ABD US IMPRESSION: Cholelithiasis with gallbladder wall thickening and positive sonographic Murphy sign suggesting acute cholecystitis. There is no pericholecystic fluid.   Mildly increased liver echogenicity likely reflects steatosis. There are 2 relative hypoechoic areas in the right hepatic lobe that may reflect fatty spurring or lesions. Recommend nonemergent evaluation with MRI   01/14/2021 Surgery   Lap cholecystectomy by Dr. Michael Boston    01/14/2021 Pathology Results   A. GALLBLADDER, CHOLECYSTECTOMY:  - Invasive well to moderately differentiated adenocarcinoma, 1.6 cm,  arising in association with a gallbladder adenoma with high-grade dysplasia  - Carcinoma invades beyond the muscularis propria but not into the serosal surface  - Resection margins are negative for carcinoma  - See oncology table   COMMENT:  About 10% of the tumor shows presence of a neuroendocrine component in the form of a high-grade neuroendocrine carcinoma.  Immunostains for synaptophysin, CD56, chromogranin and Ki-67 are confirmatory.    02/08/2021 Imaging   CT Chest IMPRESSION: 9 mm right hilar lymph node is nonspecific and may be reactive. Recommend attention on follow-up studies. Otherwise, no evidence of metastatic disease in the chest.   02/09/2021 Imaging   MRI ABD IMPRESSION: 1. Expected postsurgical changes of cholecystectomy. Normal caliber common bile duct with no evidence choledocholithiasis. 2. Hepatic steatosis with areas of focal fatty sparing. No suspicious liver lesions. 3. Small cystic lesion of the tail of the pancreas measuring 4 mm. Recommend follow up pre and post  contrast MRI/MRCP or pancreatic protocol CT in 1 year. This recommendation follows ACR consensus guidelines: Management of Incidental  Pancreatic Cysts:    02/17/2021 Initial Diagnosis   Adenocarcinoma of gallbladder ([place])   02/26/2021 Pathology Results   FINAL MICROSCOPIC DIAGNOSIS:   A. LIVER, CYSTIC DUCT MARGIN, EXCISION:  - Fibrosis and histiocytic infiltrate consistent with previous  procedure.  - Negative for malignancy.   B. LIVER, PARTIAL CENTRAL, HEPATECTOMY:  - Liver with focal fibrosis, histiocytic infiltrate and giant cell  reaction consistent with previous procedure.  - Negative for malignancy.   C. LYMPH NODE, PORTAL, EXCISION:  - Lymph node negative for metastatic carcinoma.   COMMENT:  The cystic duct margin specimen shows fibrosis with histiocytic  infiltrates.  No residual carcinoma is identified.   The liver specimen consist mostly of benign liver and at the gallbladder bed there is fibrosis with histiocytic infiltrates and giant cell reaction consistent with previous cholecystectomy.  No residual carcinoma is identified.    04/22/2021 Genetic Testing   Negative genetic testing on the CancerNext-Expanded+RNAinsight.  ATM c.1907A>G and DICER1 [id number] VUS were identified.  The report date is Aprili 6, 2023.  The CancerNext-Expanded gene panel offered by [facility] and includes sequencing and rearrangement analysis for the following 77 genes: AIP, ALK, APC*, ATM*, AXIN2, BAP1, BARD1, BLM, BMPR1A, BRCA1*, BRCA2*, BRIP1*, CDC73, CDH1*, CDK4, CDKN1B, CDKN2A, CHEK2*, CTNNA1, DICER1, FANCC, FH, FLCN, GALNT12, KIF1B, LZTR1, MAX, MEN1, MET, MLH1*, MSH2*, MSH3, MSH6*, MUTYH*, NBN, NF1*, NF2, NTHL1, PALB2*, PHOX2B, PMS2*, POT1, PRKAR1A, PTCH1, PTEN*, RAD51C*, RAD51D*, RB1, RECQL, RET, SDHA, SDHAF2, SDHB, SDHC, SDHD, SMAD4, SMARCA4, SMARCB1, SMARCE1, STK11, SUFU, TMEM127, TP53*, TSC1, TSC2, VHL and XRCC2 (sequencing and deletion/duplication); EGFR, EGLN1, HOXB13, KIT, MITF, PDGFRA, POLD1, and POLE (sequencing only); EPCAM and GREM1 (deletion/duplication only). DNA and RNA analyses performed for * genes.        INTERVAL HISTORY:  Shelby [facility] is here for a follow up of gallbladder cancer    She was last seen by me on 01/08/2023 She presents to the clinic alone. Pt denies having stomach issues. Pt ask does the Tumor Marker ever change.   All other systems were reviewed with the patient and are negative.  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   Cancer    Exercise-induced shortness of breath    "sometimes at the gym" (01/30/2012)   GERD (gastroesophageal reflux disease)    No pertinent past medical history    Status post vacuum-assisted vaginal delivery 11/29/2016   TIA (transient ischemic attack) 01/2012    SURGICAL HISTORY: Past Surgical History:  Procedure Laterality Date   CHOLECYSTECTOMY     LAPAROSCOPIC CHOLECYSTECTOMY SINGLE SITE WITH INTRAOPERATIVE CHOLANGIOGRAM N/A 01/14/2021   Procedure: LAPAROSCOPIC CHOLECYSTECTOMY SINGLE SITE;  Surgeon: Michael Boston, MD;  Location: [facility];  Service: General;  Laterality: N/A;   LAPAROSCOPY N/A 02/26/2021   Procedure: STAGING LAPAROSCOPY;  Surgeon: Dwan Bolt, MD;  Location: [facility];  Service: General;  Laterality: N/A;   OPEN HEPATECTOMY  N/A 02/26/2021   Procedure: OPEN PARTIAL CENTRAL HEPATECTOMY, PORTAL LYMPH NODE DISSECTION, INTRAOPERATIVE ULTRASOUND;  Surgeon: Dwan Bolt, MD;  Location: [place];  Service: General;  Laterality: N/A;   TEE WITHOUT CARDIOVERSION  01/31/2012   Procedure: TRANSESOPHAGEAL ECHOCARDIOGRAM (TEE);  Surgeon: Lelon Perla, MD;  Location: [facility] ENDOSCOPY;  Service: Cardiovascular;  Laterality: N/A;    I have reviewed the social history and family  history with the patient and they are unchanged from previous note.  ALLERGIES:  has No Known Allergies.  MEDICATIONS:  No current outpatient medications on file.   No current facility-administered medications for this visit.    PHYSICAL EXAMINATION: ECOG PERFORMANCE STATUS: 0 -  Asymptomatic  Vitals:   04/20/22 1418  BP: 134/77  Pulse: 87  Resp: 16  Temp: 98.7 F (37.1 C)  SpO2: 98%   Wt Readings from Last 3 Encounters:  04/20/22 198 lb 1.6 oz (89.9 kg)  01/07/22 197 lb 14.4 oz (89.8 kg)  10/14/21 202 lb 3.2 oz (91.7 kg)     GENERAL:alert, no distress and comfortable SKIN: skin color normal, no rashes or significant lesions EYES: normal, Conjunctiva are pink and non-injected, sclera clear  NEURO: alert & oriented x 3 with fluent speech  LABORATORY DATA:  I have reviewed the data as listed    Latest Ref Rng & Units 04/04/2022    1:47 PM 01/07/2022    2:30 PM 09/21/2021    8:08 AM  CBC  WBC 4.0 - 10.5 K/uL 8.4  8.7  7.1   Hemoglobin 12.0 - 15.0 g/dL 14.5  14.4  14.4   Hematocrit 36.0 - 46.0 % 42.4  42.1  41.9   Platelets 150 - 400 K/uL 299  304  275         Latest Ref Rng & Units 04/04/2022    1:47 PM 01/07/2022    2:30 PM 09/21/2021    8:08 AM  CMP  Glucose 70 - 99 mg/dL 92  107  95   BUN 6 - 20 mg/dL 9  10  10    Creatinine 0.44 - 1.00 mg/dL 0.71  0.74  0.79   Sodium 135 - 145 mmol/L 138  139  139   Potassium 3.5 - 5.1 mmol/L 3.6  3.5  4.0   Chloride 98 - 111 mmol/L 107  106  107   CO2 22 - 32 mmol/L 25  25  25    Calcium 8.9 - 10.3 mg/dL 8.9  9.5  9.4   Total Protein 6.5 - 8.1 g/dL 7.4  7.6  7.5   Total Bilirubin 0.3 - 1.2 mg/dL 0.4  0.4  0.4   Alkaline Phos 38 - 126 U/L 127  123  106   AST 15 - 41 U/L 37  36  30   ALT 0 - 44 U/L 48  43  35       RADIOGRAPHIC STUDIES: I have personally reviewed the radiological images as listed and agreed with the findings in the report. No results found.    No orders of the defined types were placed in this encounter.  All questions were answered. The patient knows to call the clinic with any problems, questions or concerns. No barriers to learning was detected. The total time spent in the appointment was 25 minutes.     Truitt Merle, MD 04/20/2022   Felicity Coyer, CMA, am acting as scribe  for Truitt Merle, MD.   I have reviewed the above documentation for accuracy and completeness, and I agree with the above.

## 2022-05-18 DIAGNOSIS — Z419 Encounter for procedure for purposes other than remedying health state, unspecified: Secondary | ICD-10-CM | POA: Diagnosis not present

## 2022-06-18 DIAGNOSIS — Z419 Encounter for procedure for purposes other than remedying health state, unspecified: Secondary | ICD-10-CM | POA: Diagnosis not present

## 2022-07-18 DIAGNOSIS — Z419 Encounter for procedure for purposes other than remedying health state, unspecified: Secondary | ICD-10-CM | POA: Diagnosis not present

## 2022-08-17 ENCOUNTER — Other Ambulatory Visit: Payer: Self-pay

## 2022-08-17 DIAGNOSIS — C23 Malignant neoplasm of gallbladder: Secondary | ICD-10-CM

## 2022-08-18 ENCOUNTER — Encounter: Payer: Self-pay | Admitting: Hematology

## 2022-08-18 ENCOUNTER — Inpatient Hospital Stay: Payer: Commercial Managed Care - HMO | Attending: Hematology

## 2022-08-18 ENCOUNTER — Inpatient Hospital Stay (HOSPITAL_BASED_OUTPATIENT_CLINIC_OR_DEPARTMENT_OTHER): Payer: Commercial Managed Care - HMO | Admitting: Hematology

## 2022-08-18 ENCOUNTER — Other Ambulatory Visit: Payer: Self-pay

## 2022-08-18 VITALS — BP 126/75 | HR 81 | Temp 98.8°F | Resp 16 | Ht 66.0 in | Wt 196.8 lb

## 2022-08-18 DIAGNOSIS — C23 Malignant neoplasm of gallbladder: Secondary | ICD-10-CM

## 2022-08-18 DIAGNOSIS — Z8509 Personal history of malignant neoplasm of other digestive organs: Secondary | ICD-10-CM | POA: Diagnosis not present

## 2022-08-18 DIAGNOSIS — Z9221 Personal history of antineoplastic chemotherapy: Secondary | ICD-10-CM | POA: Insufficient documentation

## 2022-08-18 DIAGNOSIS — Z419 Encounter for procedure for purposes other than remedying health state, unspecified: Secondary | ICD-10-CM | POA: Diagnosis not present

## 2022-08-18 LAB — CMP (CANCER CENTER ONLY)
ALT: 49 U/L — ABNORMAL HIGH (ref 0–44)
AST: 38 U/L (ref 15–41)
Albumin: 4.2 g/dL (ref 3.5–5.0)
Alkaline Phosphatase: 109 U/L (ref 38–126)
Anion gap: 8 (ref 5–15)
BUN: 11 mg/dL (ref 6–20)
CO2: 23 mmol/L (ref 22–32)
Calcium: 9.1 mg/dL (ref 8.9–10.3)
Chloride: 107 mmol/L (ref 98–111)
Creatinine: 0.72 mg/dL (ref 0.44–1.00)
GFR, Estimated: >60 mL/min (ref >60)
Glucose, Bld: 87 mg/dL (ref 70–99)
Potassium: 3.7 mmol/L (ref 3.5–5.1)
Sodium: 138 mmol/L (ref 135–145)
Total Bilirubin: 0.4 mg/dL (ref 0.3–1.2)
Total Protein: 7.4 g/dL (ref 6.5–8.1)

## 2022-08-18 LAB — CBC WITH DIFFERENTIAL (CANCER CENTER ONLY)
Abs Immature Granulocytes: 0.02 10*3/uL (ref 0.00–0.07)
Basophils Absolute: 0.1 10*3/uL (ref 0.0–0.1)
Basophils Relative: 1 %
Eosinophils Absolute: 0.3 10*3/uL (ref 0.0–0.5)
Eosinophils Relative: 4 %
HCT: 41 % (ref 36.0–46.0)
Hemoglobin: 14.5 g/dL (ref 12.0–15.0)
Immature Granulocytes: 0 %
Lymphocytes Relative: 32 %
Lymphs Abs: 2.4 10*3/uL (ref 0.7–4.0)
MCH: 27.9 pg (ref 26.0–34.0)
MCHC: 35.4 g/dL (ref 30.0–36.0)
MCV: 79 fL — ABNORMAL LOW (ref 80.0–100.0)
Monocytes Absolute: 0.5 10*3/uL (ref 0.1–1.0)
Monocytes Relative: 6 %
Neutro Abs: 4.2 10*3/uL (ref 1.7–7.7)
Neutrophils Relative %: 57 %
Platelet Count: 314 10*3/uL (ref 150–400)
RBC: 5.19 MIL/uL — ABNORMAL HIGH (ref 3.87–5.11)
RDW: 12.4 % (ref 11.5–15.5)
WBC Count: 7.3 10*3/uL (ref 4.0–10.5)
nRBC: 0 % (ref 0.0–0.2)

## 2022-08-18 NOTE — Progress Notes (Signed)
[facility]   Telephone:(336) [phone] Fax:(336) [phone]   Clinic Follow up Note   Patient Care Team: Patient, No Pcp Per as PCP - General (General Practice) Karie Soda, MD as Consulting Physician (General Surgery) Fritzi Mandes, MD as Consulting Physician (Surgical Oncology) Malachy Mood, MD as Consulting Physician (Hematology) Pollyann Samples, NP as Nurse Practitioner (Nurse Practitioner)  Date of Service:  08/18/2022  CHIEF COMPLAINT: f/u of gallbladder cancer      CURRENT THERAPY: Surveillance    ASSESSMENT:  Shelby [facility] is a 37 y.o. female with   Adenocarcinoma of gallbladder ([facility]) pT2aN0M0, stage IIA -S/p cholecystectomy on 01/14/21, pathology showed incidental finding of adenocarcinoma, margins and LNs clear.  -s/p resection on 02/26/21 with Dr. Freida Busman, pathology was negative for malignancy.  -baseline CEA obtained on 03/19/21 was WNL. -she completed 8 cycles adjuvant Xeloda 03/29/21 - 09/05/21. She tolerated very well. -on surveillance .  She is clinically doing well, reports symptom of acid reflux, otherwise no concerns. -I personally reviewed her CT scan from 04/04/2022 showed NED, and decreased size of the hypodense subcapsular focus in segment V along the gallbladder fossa adjacent to surgical clips, favored to reflect post surgical change. I discussed with pt. She is clinically doing well  -continue cancer surveillance    PLAN: - increase water and fiber intake - reviewed labs -f/u in 3 months  - ct scan and lab before next visit    SUMMARY OF ONCOLOGIC HISTORY: Oncology History Overview Note   Cancer Staging  Adenocarcinoma of gallbladder [facility]) Staging form: Gallbladder, AJCC 8th Edition - Pathologic stage from 01/14/2021: Stage IIA (pT2a, pN0, cM0) - Signed by Pollyann Samples, NP on 02/17/2021 Total positive nodes: 0     Adenocarcinoma of gallbladder ([facility])  01/14/2021 Cancer Staging   Staging form: Gallbladder, AJCC 8th Edition -  Pathologic stage from 01/14/2021: Stage IIA (pT2a, pN0, cM0) - Signed by Pollyann Samples, NP on 02/17/2021 Total positive nodes: 0   01/14/2021 Imaging   ABD US IMPRESSION: Cholelithiasis with gallbladder wall thickening and positive sonographic Murphy sign suggesting acute cholecystitis. There is no pericholecystic fluid.   Mildly increased liver echogenicity likely reflects steatosis. There are 2 relative hypoechoic areas in the right hepatic lobe that may reflect fatty spurring or lesions. Recommend nonemergent evaluation with MRI   01/14/2021 Surgery   Lap cholecystectomy by Dr. Karie Soda    01/14/2021 Pathology Results   A. GALLBLADDER, CHOLECYSTECTOMY:  - Invasive well to moderately differentiated adenocarcinoma, 1.6 cm,  arising in association with a gallbladder adenoma with high-grade dysplasia  - Carcinoma invades beyond the muscularis propria but not into the serosal surface  - Resection margins are negative for carcinoma  - See oncology table   COMMENT:  About 10% of the tumor shows presence of a neuroendocrine component in the form of a high-grade neuroendocrine carcinoma.  Immunostains for synaptophysin, CD56, chromogranin and Ki-67 are confirmatory.    02/08/2021 Imaging   CT Chest IMPRESSION: 9 mm right hilar lymph node is nonspecific and may be reactive. Recommend attention on follow-up studies. Otherwise, no evidence of metastatic disease in the chest.   02/09/2021 Imaging   MRI ABD IMPRESSION: 1. Expected postsurgical changes of cholecystectomy. Normal caliber common bile duct with no evidence choledocholithiasis. 2. Hepatic steatosis with areas of focal fatty sparing. No suspicious liver lesions. 3. Small cystic lesion of the tail of the pancreas measuring 4 mm. Recommend follow up pre and post contrast MRI/MRCP or pancreatic protocol CT  in 1 year. This recommendation follows ACR consensus guidelines: Management of Incidental Pancreatic Cysts:    02/17/2021 Initial  Diagnosis   Adenocarcinoma of gallbladder ([facility])   02/26/2021 Pathology Results   FINAL MICROSCOPIC DIAGNOSIS:   A. LIVER, CYSTIC DUCT MARGIN, EXCISION:  - Fibrosis and histiocytic infiltrate consistent with previous  procedure.  - Negative for malignancy.   B. LIVER, PARTIAL CENTRAL, HEPATECTOMY:  - Liver with focal fibrosis, histiocytic infiltrate and giant cell  reaction consistent with previous procedure.  - Negative for malignancy.   C. LYMPH NODE, PORTAL, EXCISION:  - Lymph node negative for metastatic carcinoma.   COMMENT:  The cystic duct margin specimen shows fibrosis with histiocytic  infiltrates.  No residual carcinoma is identified.   The liver specimen consist mostly of benign liver and at the gallbladder bed there is fibrosis with histiocytic infiltrates and giant cell reaction consistent with previous cholecystectomy.  No residual carcinoma is identified.    04/22/2021 Genetic Testing   Negative genetic testing on the CancerNext-Expanded+RNAinsight.  ATM c.1907A>G and DICER1 [id number] VUS were identified.  The report date is Aprili 6, 2023.  The CancerNext-Expanded gene panel offered by [facility] and includes sequencing and rearrangement analysis for the following 77 genes: AIP, ALK, APC*, ATM*, AXIN2, BAP1, BARD1, BLM, BMPR1A, BRCA1*, BRCA2*, BRIP1*, CDC73, CDH1*, CDK4, CDKN1B, CDKN2A, CHEK2*, CTNNA1, DICER1, FANCC, FH, FLCN, GALNT12, KIF1B, LZTR1, MAX, MEN1, MET, MLH1*, MSH2*, MSH3, MSH6*, MUTYH*, NBN, NF1*, NF2, NTHL1, PALB2*, PHOX2B, PMS2*, POT1, PRKAR1A, PTCH1, PTEN*, RAD51C*, RAD51D*, RB1, RECQL, RET, SDHA, SDHAF2, SDHB, SDHC, SDHD, SMAD4, SMARCA4, SMARCB1, SMARCE1, STK11, SUFU, TMEM127, TP53*, TSC1, TSC2, VHL and XRCC2 (sequencing and deletion/duplication); EGFR, EGLN1, HOXB13, KIT, MITF, PDGFRA, POLD1, and POLE (sequencing only); EPCAM and GREM1 (deletion/duplication only). DNA and RNA analyses performed for * genes.       INTERVAL HISTORY:  S[facility] is here for a follow up of gallbladder cancer    She was last seen by me on 04/20/2022. She presents to clinic today alone. She is doing well overall. She has had a couple episodes of constipation.    All other systems were reviewed with the patient and are negative.  MEDICAL HISTORY:  Past Medical History:  Diagnosis Date   Aneurysm ([facility])    Brain - has checked every 6 months   Cancer ([facility])    Exercise-induced shortness of breath    "sometimes at the gym" (01/30/2012)   GERD (gastroesophageal reflux disease)    No pertinent past medical history    Status post vacuum-assisted vaginal delivery 11/29/2016   TIA (transient ischemic attack) 01/2012    SURGICAL HISTORY: Past Surgical History:  Procedure Laterality Date   CHOLECYSTECTOMY     LAPAROSCOPIC CHOLECYSTECTOMY SINGLE SITE WITH INTRAOPERATIVE CHOLANGIOGRAM N/A 01/14/2021   Procedure: LAPAROSCOPIC CHOLECYSTECTOMY SINGLE SITE;  Surgeon: Karie Soda, MD;  Location: [facility];  Service: General;  Laterality: N/A;   LAPAROSCOPY N/A 02/26/2021   Procedure: STAGING LAPAROSCOPY;  Surgeon: Fritzi Mandes, MD;  Location: [facility];  Service: General;  Laterality: N/A;   OPEN HEPATECTOMY  N/A 02/26/2021   Procedure: OPEN PARTIAL CENTRAL HEPATECTOMY, PORTAL LYMPH NODE DISSECTION, INTRAOPERATIVE ULTRASOUND;  Surgeon: Fritzi Mandes, MD;  Location: [facility];  Service: General;  Laterality: N/A;   TEE WITHOUT CARDIOVERSION  01/31/2012   Procedure: TRANSESOPHAGEAL ECHOCARDIOGRAM (TEE);  Surgeon: Lewayne Bunting, MD;  Location: [facility] ENDOSCOPY;  Service: Cardiovascular;  Laterality: N/A;    I have reviewed the social history and family history with the  patient and they are unchanged from previous note.  ALLERGIES:  has No Known Allergies.  MEDICATIONS:  No current outpatient medications on file.   No current facility-administered medications for this visit.    PHYSICAL EXAMINATION: ECOG PERFORMANCE STATUS: 0 - Asymptomatic  Vitals:    08/18/22 1342  BP: 126/75  Pulse: 81  Resp: 16  Temp: 98.8 F (37.1 C)  SpO2: 100%   Wt Readings from Last 3 Encounters:  08/18/22 196 lb 12.8 oz (89.3 kg)  04/20/22 198 lb 1.6 oz (89.9 kg)  01/07/22 197 lb 14.4 oz (89.8 kg)     GENERAL:alert, no distress and comfortable SKIN: skin color, texture, turgor are normal, no rashes or significant lesions EYES: normal, Conjunctiva are pink and non-injected, sclera clear NECK: supple, thyroid normal size, non-tender, without nodularity LYMPH:  no palpable lymphadenopathy in the cervical, axillary  LUNGS: clear to auscultation and percussion with normal breathing effort HEART: regular rate & rhythm and no murmurs and no lower extremity edema ABDOMEN:abdomen soft, non-tender and normal bowel sounds Musculoskeletal:no cyanosis of digits and no clubbing  NEURO: alert & oriented x 3 with fluent speech, no focal motor/sensory deficits  LABORATORY DATA:  I have reviewed the data as listed    Latest Ref Rng & Units 08/18/2022    1:23 PM 04/04/2022    1:47 PM 01/07/2022    2:30 PM  CBC  WBC 4.0 - 10.5 K/uL 7.3  8.4  8.7   Hemoglobin 12.0 - 15.0 g/dL 16.1  09.6  04.5   Hematocrit 36.0 - 46.0 % 41.0  42.4  42.1   Platelets 150 - 400 K/uL 314  299  304         Latest Ref Rng & Units 08/18/2022    1:23 PM 04/04/2022    1:47 PM 01/07/2022    2:30 PM  CMP  Glucose 70 - 99 mg/dL 87  92  409   BUN 6 - 20 mg/dL 11  9  10    Creatinine 0.44 - 1.00 mg/dL 8.11  9.14  7.82   Sodium 135 - 145 mmol/L 138  138  139   Potassium 3.5 - 5.1 mmol/L 3.7  3.6  3.5   Chloride 98 - 111 mmol/L 107  107  106   CO2 22 - 32 mmol/L 23  25  25    Calcium 8.9 - 10.3 mg/dL 9.1  8.9  9.5   Total Protein 6.5 - 8.1 g/dL 7.4  7.4  7.6   Total Bilirubin 0.3 - 1.2 mg/dL 0.4  0.4  0.4   Alkaline Phos 38 - 126 U/L 109  127  123   AST 15 - 41 U/L 38  37  36   ALT 0 - 44 U/L 49  48  43       RADIOGRAPHIC STUDIES: I have personally reviewed the radiological images as  listed and agreed with the findings in the report. No results found.    Orders Placed This Encounter  Procedures   CT CHEST ABDOMEN PELVIS W CONTRAST    Standing Status:   Future    Standing Expiration Date:   08/18/2023    Order Specific Question:   If indicated for the ordered procedure, I authorize the administration of contrast media per Radiology protocol    Answer:   Yes    Order Specific Question:   Does the patient have a contrast media/X-ray dye allergy?    Answer:   No    Order Specific  Question:   Is patient pregnant?    Answer:   No    Order Specific Question:   Preferred imaging location?    Answer:   [facility]    Order Specific Question:   If indicated for the ordered procedure, I authorize the administration of oral contrast media per Radiology protocol    Answer:   Yes   All questions were answered. The patient knows to call the clinic with any problems, questions or concerns. No barriers to learning was detected. The total time spent in the appointment was 20 minutes.     Malachy Mood, MD 08/18/2022   I, Sharlette Dense, CMA, am acting as scribe for Malachy Mood, MD.   I have reviewed the above documentation for accuracy and completeness, and I agree with the above.

## 2022-08-18 NOTE — Assessment & Plan Note (Signed)
pT2aN0M0, stage IIA -S/p cholecystectomy on 01/14/21, pathology showed incidental finding of adenocarcinoma, margins and LNs clear.  -s/p resection on 02/26/21 with Dr. Zenia Resides, pathology was negative for malignancy.  -baseline CEA obtained on 03/19/21 was WNL. -she completed 8 cycles adjuvant Xeloda 03/29/21 - 09/05/21. She tolerated very well. -on surveillance .  She is clinically doing well, reports symptom of acid reflux, otherwise no concerns. -I personally reviewed her CT scan from 04/04/2022 showed NED, and decreased size of the hypodense subcapsular focus in segment V along the gallbladder fossa adjacent to surgical clips, favored to reflect post surgical change. I discussed with pt. She is clinically doing well  -continue cancer surveillance

## 2022-08-19 ENCOUNTER — Telehealth: Payer: Self-pay | Admitting: Hematology

## 2022-09-18 DIAGNOSIS — Z419 Encounter for procedure for purposes other than remedying health state, unspecified: Secondary | ICD-10-CM | POA: Diagnosis not present

## 2022-10-18 DIAGNOSIS — Z419 Encounter for procedure for purposes other than remedying health state, unspecified: Secondary | ICD-10-CM | POA: Diagnosis not present

## 2022-11-14 ENCOUNTER — Ambulatory Visit (HOSPITAL_COMMUNITY)
Admission: RE | Admit: 2022-11-14 | Discharge: 2022-11-14 | Disposition: A | Discharge status: Home / Self Care | Payer: Managed Care, Other (non HMO) | Source: Ambulatory Visit | Attending: Hematology | Admitting: Hematology

## 2022-11-14 DIAGNOSIS — Z9049 Acquired absence of other specified parts of digestive tract: Secondary | ICD-10-CM | POA: Diagnosis not present

## 2022-11-14 DIAGNOSIS — C23 Malignant neoplasm of gallbladder: Secondary | ICD-10-CM | POA: Diagnosis present

## 2022-11-14 DIAGNOSIS — Z8509 Personal history of malignant neoplasm of other digestive organs: Secondary | ICD-10-CM | POA: Diagnosis not present

## 2022-11-14 MED ORDER — IOHEXOL 300 MG/ML  SOLN
100.0000 mL | Freq: Once | INTRAMUSCULAR | Status: AC | PRN
  Administered 2022-11-14: 100 mL via INTRAVENOUS

## 2022-11-18 DIAGNOSIS — Z419 Encounter for procedure for purposes other than remedying health state, unspecified: Secondary | ICD-10-CM | POA: Diagnosis not present

## 2022-11-21 NOTE — Assessment & Plan Note (Signed)
pT2aN0M0, stage IIA -S/p cholecystectomy on 01/14/21, pathology showed incidental finding of adenocarcinoma, margins and LNs clear.  -s/p resection on 02/26/21 with Dr. Freida Busman, pathology was negative for malignancy.  -baseline CEA obtained on 03/19/21 was WNL. -she completed 8 cycles adjuvant Xeloda 03/29/21 - 09/05/21. She tolerated very well. -on surveillance .  She is clinically doing well, reports symptom of acid reflux, otherwise no concerns. -I personally reviewed her CT scan from 11/14/2022 which showed NED, and decreased size of the hypodense subcapsular focus in segment V along the gallbladder fossa adjacent to surgical clips, favored to reflect post surgical change. I discussed with pt. She is clinically doing well  -continue cancer surveillance

## 2022-11-22 ENCOUNTER — Other Ambulatory Visit: Payer: Self-pay | Attending: Hematology

## 2022-11-22 ENCOUNTER — Inpatient Hospital Stay (HOSPITAL_BASED_OUTPATIENT_CLINIC_OR_DEPARTMENT_OTHER): Payer: Self-pay | Admitting: Hematology

## 2022-11-22 VITALS — BP 125/75 | HR 76 | Temp 98.2°F | Resp 18 | Ht 66.0 in | Wt 199.7 lb

## 2022-11-22 DIAGNOSIS — Z9221 Personal history of antineoplastic chemotherapy: Secondary | ICD-10-CM | POA: Diagnosis not present

## 2022-11-22 DIAGNOSIS — C23 Malignant neoplasm of gallbladder: Secondary | ICD-10-CM | POA: Diagnosis not present

## 2022-11-22 DIAGNOSIS — G629 Polyneuropathy, unspecified: Secondary | ICD-10-CM | POA: Insufficient documentation

## 2022-11-22 LAB — CBC WITH DIFFERENTIAL (CANCER CENTER ONLY)
Abs Immature Granulocytes: 0.01 10*3/uL (ref 0.00–0.07)
Basophils Absolute: 0.1 10*3/uL (ref 0.0–0.1)
Basophils Relative: 1 %
Eosinophils Absolute: 0.3 10*3/uL (ref 0.0–0.5)
Eosinophils Relative: 4 %
HCT: 42.4 % (ref 36.0–46.0)
Hemoglobin: 14.2 g/dL (ref 12.0–15.0)
Immature Granulocytes: 0 %
Lymphocytes Relative: 29 %
Lymphs Abs: 2.2 10*3/uL (ref 0.7–4.0)
MCH: 27.2 pg (ref 26.0–34.0)
MCHC: 33.5 g/dL (ref 30.0–36.0)
MCV: 81.2 fL (ref 80.0–100.0)
Monocytes Absolute: 0.4 10*3/uL (ref 0.1–1.0)
Monocytes Relative: 5 %
Neutro Abs: 4.8 10*3/uL (ref 1.7–7.7)
Neutrophils Relative %: 61 %
Platelet Count: 291 10*3/uL (ref 150–400)
RBC: 5.22 MIL/uL — ABNORMAL HIGH (ref 3.87–5.11)
RDW: 12 % (ref 11.5–15.5)
WBC Count: 7.8 10*3/uL (ref 4.0–10.5)
nRBC: 0 % (ref 0.0–0.2)

## 2022-11-22 LAB — CMP (CANCER CENTER ONLY)
ALT: 50 U/L — ABNORMAL HIGH (ref 0–44)
AST: 30 U/L (ref 15–41)
Albumin: 4.1 g/dL (ref 3.5–5.0)
Alkaline Phosphatase: 115 U/L (ref 38–126)
Anion gap: 5 (ref 5–15)
BUN: 12 mg/dL (ref 6–20)
CO2: 26 mmol/L (ref 22–32)
Calcium: 9.2 mg/dL (ref 8.9–10.3)
Chloride: 108 mmol/L (ref 98–111)
Creatinine: 0.78 mg/dL (ref 0.44–1.00)
GFR, Estimated: >60 mL/min (ref >60)
Glucose, Bld: 94 mg/dL (ref 70–99)
Potassium: 4.1 mmol/L (ref 3.5–5.1)
Sodium: 139 mmol/L (ref 135–145)
Total Bilirubin: 0.3 mg/dL (ref <1.2)
Total Protein: 7.2 g/dL (ref 6.5–8.1)

## 2022-11-22 NOTE — Progress Notes (Signed)
[facility]   Telephone:([phone]) [phone] Fax:([phone]) [phone]   Clinic Follow up Note   Patient Care Team: Patient, No Pcp Per as PCP - General (General Practice) Karie Soda, MD as Consulting Physician (General Surgery) Fritzi Mandes, MD as Consulting Physician (Surgical Oncology) Malachy Mood, MD as Consulting Physician (Hematology) Pollyann Samples, NP as Nurse Practitioner (Nurse Practitioner)  Date of Service:  11/22/2022  CHIEF COMPLAINT: f/u of gallbladder cancer  CURRENT THERAPY:  Cancer surveillance  Oncology History   Adenocarcinoma of gallbladder (HCC) pT2aN0M0, stage IIA -S/p cholecystectomy on 01/14/21, pathology showed incidental finding of adenocarcinoma, margins and LNs clear.  -s/p resection on 02/26/21 with Dr. Freida Busman, pathology was negative for malignancy.  -baseline CEA obtained on 03/19/21 was WNL. -she completed 8 cycles adjuvant Xeloda 03/29/21 - 09/05/21. She tolerated very well. -on surveillance .  She is clinically doing well, reports symptom of acid reflux, otherwise no concerns. -I personally reviewed her CT scan from 11/14/2022 which showed NED, and decreased size of the hypodense subcapsular focus in segment V along the gallbladder fossa adjacent to surgical clips, favored to reflect post surgical change. I discussed with pt. She is clinically doing well  -continue cancer surveillance     Assessment and Plan    Gallbladder Cancer Two years post-diagnosis with no signs of recurrence on recent imaging. Cystic lesion at surgical site is decreasing in size and expected to be absorbed by the body. No current symptoms related to this lesion. -Plan for another scan in one year. -lab reviewed, Slightly elevated liver enzyme (50, upper limit 44), not a new finding and not of concern at this time. -Continue follow-up appointments every four months for the next year.  Peripheral Neuropathy Mild residual numbness and tingling in hands and fingers,  likely a side effect from previous chemotherapy. -Encourage regular exercise and activity to manage symptoms.  Plan -Lab and CT scan reviewed, NED -Lab and follow-up in 4 months     SUMMARY OF ONCOLOGIC HISTORY: Oncology History Overview Note   Cancer Staging  Adenocarcinoma of gallbladder [facility]) Staging form: Gallbladder, AJCC 8th Edition - Pathologic stage from 01/14/2021: Stage IIA (pT2a, pN0, cM0) - Signed by Pollyann Samples, NP on 02/17/2021 Total positive nodes: 0     Adenocarcinoma of gallbladder (HCC)  01/14/2021 Cancer Staging   Staging form: Gallbladder, AJCC 8th Edition - Pathologic stage from 01/14/2021: Stage IIA (pT2a, pN0, cM0) - Signed by Pollyann Samples, NP on 02/17/2021 Total positive nodes: 0   01/14/2021 Imaging   ABD US IMPRESSION: Cholelithiasis with gallbladder wall thickening and positive sonographic Murphy sign suggesting acute cholecystitis. There is no pericholecystic fluid.   Mildly increased liver echogenicity likely reflects steatosis. There are 2 relative hypoechoic areas in the right hepatic lobe that may reflect fatty spurring or lesions. Recommend nonemergent evaluation with MRI   01/14/2021 Surgery   Lap cholecystectomy by Dr. Karie Soda    01/14/2021 Pathology Results   A. GALLBLADDER, CHOLECYSTECTOMY:  - Invasive well to moderately differentiated adenocarcinoma, 1.6 cm,  arising in association with a gallbladder adenoma with high-grade dysplasia  - Carcinoma invades beyond the muscularis propria but not into the serosal surface  - Resection margins are negative for carcinoma  - See oncology table   COMMENT:  About 10% of the tumor shows presence of a neuroendocrine component in the form of a high-grade neuroendocrine carcinoma.  Immunostains for synaptophysin, CD56, chromogranin and Ki-67 are confirmatory.    02/08/2021 Imaging   CT  Chest IMPRESSION: 9 mm right hilar lymph node is nonspecific and may be reactive. Recommend attention on  follow-up studies. Otherwise, no evidence of metastatic disease in the chest.   02/09/2021 Imaging   MRI ABD IMPRESSION: 1. Expected postsurgical changes of cholecystectomy. Normal caliber common bile duct with no evidence choledocholithiasis. 2. Hepatic steatosis with areas of focal fatty sparing. No suspicious liver lesions. 3. Small cystic lesion of the tail of the pancreas measuring 4 mm. Recommend follow up pre and post contrast MRI/MRCP or pancreatic protocol CT in 1 year. This recommendation follows ACR consensus guidelines: Management of Incidental Pancreatic Cysts:    02/17/2021 Initial Diagnosis   Adenocarcinoma of gallbladder (HCC)   02/26/2021 Pathology Results   FINAL MICROSCOPIC DIAGNOSIS:   A. LIVER, CYSTIC DUCT MARGIN, EXCISION:  - Fibrosis and histiocytic infiltrate consistent with previous  procedure.  - Negative for malignancy.   B. LIVER, PARTIAL CENTRAL, HEPATECTOMY:  - Liver with focal fibrosis, histiocytic infiltrate and giant cell  reaction consistent with previous procedure.  - Negative for malignancy.   C. LYMPH NODE, PORTAL, EXCISION:  - Lymph node negative for metastatic carcinoma.   COMMENT:  The cystic duct margin specimen shows fibrosis with histiocytic  infiltrates.  No residual carcinoma is identified.   The liver specimen consist mostly of benign liver and at the gallbladder bed there is fibrosis with histiocytic infiltrates and giant cell reaction consistent with previous cholecystectomy.  No residual carcinoma is identified.    04/22/2021 Genetic Testing   Negative genetic testing on the CancerNext-Expanded+RNAinsight.  ATM c.[id number] and DICER1 [id number] VUS were identified.  The report date is Aprili 6, 2023.  The CancerNext-Expanded gene panel offered by [facility] and includes sequencing and rearrangement analysis for the following 77 genes: AIP, ALK, APC*, ATM*, AXIN2, BAP1, BARD1, BLM, BMPR1A, BRCA1*, BRCA2*, BRIP1*, CDC73, CDH1*,  CDK4, CDKN1B, CDKN2A, CHEK2*, CTNNA1, DICER1, FANCC, FH, FLCN, GALNT12, KIF1B, LZTR1, MAX, MEN1, MET, MLH1*, MSH2*, MSH3, MSH6*, MUTYH*, NBN, NF1*, NF2, NTHL1, PALB2*, PHOX2B, PMS2*, POT1, PRKAR1A, PTCH1, PTEN*, RAD51C*, RAD51D*, RB1, RECQL, RET, SDHA, SDHAF2, SDHB, SDHC, SDHD, SMAD4, SMARCA4, SMARCB1, SMARCE1, STK11, SUFU, TMEM127, TP53*, TSC1, TSC2, VHL and XRCC2 (sequencing and deletion/duplication); EGFR, EGLN1, HOXB13, KIT, MITF, PDGFRA, POLD1, and POLE (sequencing only); EPCAM and GREM1 (deletion/duplication only). DNA and RNA analyses performed for * genes.       Discussed the use of AI scribe software for clinical note transcription with the patient, who gave verbal consent to proceed.  History of Present Illness   The patient, a 37 year old with a history of gallbladder cancer, presents for a routine follow-up. She reports feeling well overall, with no concerns or complaints. Specifically, she denies any abdominal pain or discomfort. Bowel movements are described as normal, even improved compared to previous states. She has seen the recent scan report and is aware of the cystic lesion at the surgical site, which is understood to be a change from the surgery and is decreasing in size. She also reports some residual numbness and tingling in her hands and fingers, a side effect from previous chemotherapy treatment.         All other systems were reviewed with the patient and are negative.  MEDICAL HISTORY:  Past Medical History:  Diagnosis Date   Aneurysm (HCC)    Brain - has checked every 6 months   Cancer (HCC)    Exercise-induced shortness of breath    "sometimes at the gym" (01/30/2012)   GERD (gastroesophageal reflux disease)  No pertinent past medical history    Status post vacuum-assisted vaginal delivery 11/29/2016   TIA (transient ischemic attack) 01/2012    SURGICAL HISTORY: Past Surgical History:  Procedure Laterality Date   CHOLECYSTECTOMY     LAPAROSCOPIC  CHOLECYSTECTOMY SINGLE SITE WITH INTRAOPERATIVE CHOLANGIOGRAM N/A 01/14/2021   Procedure: LAPAROSCOPIC CHOLECYSTECTOMY SINGLE SITE;  Surgeon: Karie Soda, MD;  Location: [facility] ORS;  Service: General;  Laterality: N/A;   LAPAROSCOPY N/A 02/26/2021   Procedure: STAGING LAPAROSCOPY;  Surgeon: Fritzi Mandes, MD;  Location: [facility] OR;  Service: General;  Laterality: N/A;   OPEN HEPATECTOMY  N/A 02/26/2021   Procedure: OPEN PARTIAL CENTRAL HEPATECTOMY, PORTAL LYMPH NODE DISSECTION, INTRAOPERATIVE ULTRASOUND;  Surgeon: Fritzi Mandes, MD;  Location: [facility] OR;  Service: General;  Laterality: N/A;   TEE WITHOUT CARDIOVERSION  01/31/2012   Procedure: TRANSESOPHAGEAL ECHOCARDIOGRAM (TEE);  Surgeon: Lewayne Bunting, MD;  Location: [facility] ENDOSCOPY;  Service: Cardiovascular;  Laterality: N/A;    I have reviewed the social history and family history with the patient and they are unchanged from previous note.  ALLERGIES:  has No Known Allergies.  MEDICATIONS:  No current outpatient medications on file.   No current facility-administered medications for this visit.    PHYSICAL EXAMINATION: ECOG PERFORMANCE STATUS: 0 - Asymptomatic  Vitals:   11/22/22 1010  BP: 125/75  Pulse: 76  Resp: 18  Temp: 98.2 F (36.8 C)  SpO2: 100%   Wt Readings from Last 3 Encounters:  11/22/22 199 lb 11.2 oz (90.6 kg)  08/18/22 196 lb 12.8 oz (89.3 kg)  04/20/22 198 lb 1.6 oz (89.9 kg)     GENERAL:alert, no distress and comfortable SKIN: skin color, texture, turgor are normal, no rashes or significant lesions EYES: normal, Conjunctiva are pink and non-injected, sclera clear NECK: supple, thyroid normal size, non-tender, without nodularity LYMPH:  no palpable lymphadenopathy in the cervical, axillary  LUNGS: clear to auscultation and percussion with normal breathing effort HEART: regular rate & rhythm and no murmurs and no lower extremity edema ABDOMEN:abdomen soft, non-tender and normal bowel sounds Musculoskeletal:no  cyanosis of digits and no clubbing  NEURO: alert & oriented x 3 with fluent speech, no focal motor/sensory deficits .  LABORATORY DATA:  I have reviewed the data as listed    Latest Ref Rng & Units 11/22/2022    9:24 AM 08/18/2022    1:23 PM 04/04/2022    1:47 PM  CBC  WBC 4.0 - 10.5 K/uL 7.8  7.3  8.4   Hemoglobin 12.0 - 15.0 g/dL 09.6  04.5  40.9   Hematocrit 36.0 - 46.0 % 42.4  41.0  42.4   Platelets 150 - 400 K/uL 291  314  299         Latest Ref Rng & Units 11/22/2022    9:24 AM 08/18/2022    1:23 PM 04/04/2022    1:47 PM  CMP  Glucose 70 - 99 mg/dL 94  87  92   BUN 6 - 20 mg/dL 12  11  9    Creatinine 0.44 - 1.00 mg/dL 8.11  9.14  7.82   Sodium 135 - 145 mmol/L 139  138  138   Potassium 3.5 - 5.1 mmol/L 4.1  3.7  3.6   Chloride 98 - 111 mmol/L 108  107  107   CO2 22 - 32 mmol/L 26  23  25    Calcium 8.9 - 10.3 mg/dL 9.2  9.1  8.9   Total Protein 6.5 - 8.1 g/dL  7.2  7.4  7.4   Total Bilirubin <1.2 mg/dL 0.3  0.4  0.4   Alkaline Phos 38 - 126 U/L 115  109  127   AST 15 - 41 U/L 30  38  37   ALT 0 - 44 U/L 50  49  48       RADIOGRAPHIC STUDIES: I have personally reviewed the radiological images as listed and agreed with the findings in the report. No results found.    No orders of the defined types were placed in this encounter.  All questions were answered. The patient knows to call the clinic with any problems, questions or concerns. No barriers to learning was detected. The total time spent in the appointment was 25 minutes.     Malachy Mood, MD 11/22/2022

## 2022-11-23 LAB — CANCER ANTIGEN 19-9: CA 19-9: 16 U/mL (ref 0–35)

## 2022-11-24 ENCOUNTER — Inpatient Hospital Stay: Payer: Managed Care, Other (non HMO)

## 2022-11-24 ENCOUNTER — Inpatient Hospital Stay: Payer: Managed Care, Other (non HMO) | Admitting: Hematology

## 2022-12-18 DIAGNOSIS — Z419 Encounter for procedure for purposes other than remedying health state, unspecified: Secondary | ICD-10-CM | POA: Diagnosis not present

## 2023-01-18 DIAGNOSIS — Z419 Encounter for procedure for purposes other than remedying health state, unspecified: Secondary | ICD-10-CM | POA: Diagnosis not present

## 2023-02-02 ENCOUNTER — Ambulatory Visit: Payer: Medicaid Other | Admitting: Nurse Practitioner

## 2023-02-02 VITALS — BP 100/80 | HR 76 | Temp 98.8°F | Ht 66.0 in | Wt 199.6 lb

## 2023-02-02 DIAGNOSIS — I671 Cerebral aneurysm, nonruptured: Secondary | ICD-10-CM | POA: Diagnosis not present

## 2023-02-02 DIAGNOSIS — Z833 Family history of diabetes mellitus: Secondary | ICD-10-CM

## 2023-02-02 DIAGNOSIS — Z2821 Immunization not carried out because of patient refusal: Secondary | ICD-10-CM

## 2023-02-02 DIAGNOSIS — Z1159 Encounter for screening for other viral diseases: Secondary | ICD-10-CM | POA: Diagnosis not present

## 2023-02-02 DIAGNOSIS — C23 Malignant neoplasm of gallbladder: Secondary | ICD-10-CM

## 2023-02-02 DIAGNOSIS — Z7689 Persons encountering health services in other specified circumstances: Secondary | ICD-10-CM

## 2023-02-02 DIAGNOSIS — Z23 Encounter for immunization: Secondary | ICD-10-CM | POA: Diagnosis not present

## 2023-02-02 DIAGNOSIS — R221 Localized swelling, mass and lump, neck: Secondary | ICD-10-CM | POA: Diagnosis not present

## 2023-02-02 DIAGNOSIS — Z8659 Personal history of other mental and behavioral disorders: Secondary | ICD-10-CM

## 2023-02-02 DIAGNOSIS — Z8759 Personal history of other complications of pregnancy, childbirth and the puerperium: Secondary | ICD-10-CM

## 2023-02-02 NOTE — Patient Instructions (Signed)
If your symptoms worsen or you have shortness of breath, chest pain to to ER or any changes call to office.

## 2023-02-02 NOTE — Progress Notes (Signed)
Madelaine Bhat, CMA,acting as a Neurosurgeon for Arnette Felts, FNP.,have documented all relevant documentation on the behalf of Arnette Felts, FNP,as directed by  Arnette Felts, FNP while in the presence of Arnette Felts, FNP.  Subjective:  Patient ID: Shelby Cunningham , female    DOB: 1985/07/18 , 38 y.o.   MRN: [id number]  No chief complaint on file.   HPI  Patient presents today to establish care, she was seeing a PCP in [facility] about 5 years ago.she is not working. Married, she has a 76 y/o - son (healthy).   PMH - brain aneurysm 2014 after having stroke like symptoms, she had a TIA at that time. She was seeing Dr. Alphonzo Lemmings, she had an insurance change and now is back in network. She plans to f/u with him   Gallbladder cancer 2 years ago - she was having pain and removed and when sent to patho. She did 6 months of oral chemo. She continues to be followed by Dr Mosetta Putt.   Patient reports compliance with medication. Patient denies any chest pain, SOB, or headaches. Patient would like to discuss a lump on her left shoulder blade/neck. Just noticed recently. Occasionally will have body chills. Patient reports sometimes it swells but it is no pain. Patient reports her family has a history of diabetes she would like to be checked today. Patient reports she has a history of gallbladder cancer, she is established with a oncologist.  Patient reports she hasn't seen a PCP in a while.      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 every 6 months   Cancer (HCC)    Depression    Exercise-induced shortness of breath    "sometimes at the gym" (01/30/2012)   GERD (gastroesophageal reflux disease)    MDD (major depressive disorder), severe (HCC) 01/26/2017   No pertinent past medical history    Status post vacuum-assisted vaginal delivery 11/29/2016   Suicidal ideations    TIA (transient ischemic attack) 01/2012     Family History   Problem Relation Age of Onset   Diabetes Mother    Anxiety disorder Mother    Cancer Sister        ovarian   Diabetes Paternal Uncle    Cancer Sister     No current outpatient medications on file.   No Known Allergies   Review of Systems  Constitutional: Negative.  Negative for appetite change and fatigue.  Respiratory: Negative.    Cardiovascular: Negative.   Endocrine: Negative for cold intolerance and heat intolerance.  Skin: Negative.   Neurological: Negative.  Negative for dizziness, light-headedness and headaches.  Psychiatric/Behavioral:  Negative for behavioral problems and dysphoric mood. The patient is not nervous/anxious.      Today's Vitals   02/02/23 1029  BP: 100/80  Pulse: 76  Temp: 98.8 F (37.1 C)  TempSrc: Oral  Weight: 199 lb 9.6 oz (90.5 kg)  Height: 5\' 6"  (1.676 m)  PainSc: 0-No pain   Body mass index is 32.22 kg/m.  Wt Readings from Last 3 Encounters:  02/02/23 199 lb 9.6 oz (90.5 kg)  11/22/22 199 lb 11.2 oz (90.6 kg)  08/18/22 196 lb 12.8 oz (89.3 kg)      Objective:  Physical Exam Vitals reviewed.  Constitutional:      Appearance: She is well-developed.  HENT:     Head: Normocephalic and atraumatic.  Eyes:  Pupils: Pupils are equal, round, and reactive to light.  Cardiovascular:     Rate and Rhythm: Normal rate and regular rhythm.     Pulses: Normal pulses.     Heart sounds: Normal heart sounds. No murmur heard. Pulmonary:     Effort: Pulmonary effort is normal.     Breath sounds: Normal breath sounds.  Musculoskeletal:        General: Normal range of motion.  Skin:    General: Skin is warm and dry.     Capillary Refill: Capillary refill takes less than 2 seconds.  Neurological:     General: No focal deficit present.     Mental Status: She is alert and oriented to person, place, and time.     Cranial Nerves: No cranial nerve deficit.  Psychiatric:        Mood and Affect: Mood normal.        03/07/2018    2:14 PM   Depression screen PHQ 2/9  Decreased Interest 0  Down, Depressed, Hopeless 0  PHQ - 2 Score 0       Assessment And Plan:  Establishing care with new doctor, encounter for Assessment & Plan: Patient is here to establish care. Went over patient medical, family, social and surgical history. Reviewed with patient their medications and any allergies  Reviewed with patient their sexual orientation, drug/tobacco and alcohol use Dicussed any new concerns with patient  recommended patient comes in for a physical exam and complete blood work.  Educated patient about the importance of annual screenings and immunizations.  Advised patient to eat a healthy diet along with exercise for atleast 30-45 min atleast 4-5 days of the week.     Brain aneurysm Assessment & Plan: She is planning to return to Dr. Alphonzo Lemmings (Neurosurgeon).   Orders: -     Ambulatory referral to Neurosurgery  Gallbladder cancer [facility])  Localized swelling, mass or lump of neck Assessment & Plan: Left side of her neck at the supraclavicular space is swollen, due to her history of colon cancer will check an ultrasound  Orders: -     CBC with Differential/Platelet -     US SOFT TISSUE HEAD & NECK (NON-THYROID); Future  Encounter for hepatitis C screening test for low risk patient Assessment & Plan: Will check Hepatitis C screening due to recent recommendations to screen all adults 18 years and older   Orders: -     Hepatitis C antibody  History of postpartum depression  COVID-19 vaccination declined Assessment & Plan: Declines covid 19 vaccine. Discussed risk of covid 80 and if she changes her mind about the vaccine to call the office. Education has been provided regarding the importance of this vaccine but patient still declined. Advised may receive this vaccine at local pharmacy or Health Dept.or vaccine clinic. Aware to provide a copy of the vaccination record if obtained from local pharmacy or Health Dept.   Encouraged to take multivitamin, vitamin d, vitamin c and zinc to increase immune system. Aware can call office if would like to have vaccine here at office. Verbalized acceptance and understanding.    Need for influenza vaccination Assessment & Plan: Influenza vaccine administered Encouraged to take Tylenol as needed for fever or muscle aches.   Orders: -     Flu vaccine trivalent PF, 6mos and older(Flulaval,Afluria,Fluarix,Fluzone)  Family history of diabetes mellitus -     Hemoglobin A1c  Adenocarcinoma of gallbladder (HCC) Assessment & Plan: She was treated with oral chemo and had  a tumor removed, continues to be followed by Dr. Mosetta Putt    Return in about 4 months (around 06/02/2023) for phy.  Patient was given opportunity to ask questions. Patient verbalized understanding of the plan and was able to repeat key elements of the plan. All questions were answered to their satisfaction.    Jeanell Sparrow, FNP, have reviewed all documentation for this visit. The documentation on 02/02/23 for the exam, diagnosis, procedures, and orders are all accurate and complete.   IF YOU HAVE BEEN REFERRED TO A SPECIALIST, IT MAY TAKE 1-2 WEEKS TO SCHEDULE/PROCESS THE REFERRAL. IF YOU HAVE NOT HEARD FROM US/SPECIALIST IN TWO WEEKS, PLEASE GIVE [place] A CALL AT [phone].

## 2023-02-03 ENCOUNTER — Ambulatory Visit
Admission: RE | Admit: 2023-02-03 | Discharge: 2023-02-03 | Disposition: A | Discharge status: Home / Self Care | Payer: Commercial Managed Care - HMO | Source: Ambulatory Visit | Attending: Nurse Practitioner | Admitting: Nurse Practitioner

## 2023-02-03 DIAGNOSIS — R221 Localized swelling, mass and lump, neck: Secondary | ICD-10-CM

## 2023-02-03 LAB — CBC WITH DIFFERENTIAL/PLATELET
Basophils Absolute: 0.1 10*3/uL (ref 0.0–0.2)
Basos: 1 %
EOS (ABSOLUTE): 0.3 10*3/uL (ref 0.0–0.4)
Eos: 3 %
Hematocrit: 43.3 % (ref 34.0–46.6)
Hemoglobin: 14.3 g/dL (ref 11.1–15.9)
Immature Grans (Abs): 0 10*3/uL (ref 0.0–0.1)
Immature Granulocytes: 0 %
Lymphocytes Absolute: 2.4 10*3/uL (ref 0.7–3.1)
Lymphs: 25 %
MCH: 27.6 pg (ref 26.6–33.0)
MCHC: 33 g/dL (ref 31.5–35.7)
MCV: 83 fL (ref 79–97)
Monocytes Absolute: 0.5 10*3/uL (ref 0.1–0.9)
Monocytes: 5 %
Neutrophils Absolute: 6.4 10*3/uL (ref 1.4–7.0)
Neutrophils: 66 %
Platelets: 329 10*3/uL (ref 150–450)
RBC: 5.19 x10E6/uL (ref 3.77–5.28)
RDW: 12.6 % (ref 11.7–15.4)
WBC: 9.6 10*3/uL (ref 3.4–10.8)

## 2023-02-03 LAB — HEMOGLOBIN A1C
Est. average glucose Bld gHb Est-mCnc: 111 mg/dL
Hgb A1c MFr Bld: 5.5 % (ref 4.8–5.6)

## 2023-02-03 LAB — HEPATITIS C ANTIBODY: Hep C Virus Ab: NONREACTIVE

## 2023-02-06 ENCOUNTER — Encounter: Payer: Self-pay | Admitting: Nurse Practitioner

## 2023-02-15 ENCOUNTER — Encounter: Payer: Self-pay | Admitting: Nurse Practitioner

## 2023-02-15 DIAGNOSIS — R221 Localized swelling, mass and lump, neck: Secondary | ICD-10-CM | POA: Insufficient documentation

## 2023-02-15 DIAGNOSIS — Z2821 Immunization not carried out because of patient refusal: Secondary | ICD-10-CM | POA: Insufficient documentation

## 2023-02-15 DIAGNOSIS — Z1159 Encounter for screening for other viral diseases: Secondary | ICD-10-CM | POA: Insufficient documentation

## 2023-02-15 DIAGNOSIS — Z7689 Persons encountering health services in other specified circumstances: Secondary | ICD-10-CM | POA: Insufficient documentation

## 2023-02-15 NOTE — Assessment & Plan Note (Signed)
Left side of her neck at the supraclavicular space is swollen, due to her history of colon cancer will check an ultrasound

## 2023-02-15 NOTE — Assessment & Plan Note (Deleted)
She was treated with oral chemo and had a tumor removed, continues to be followed by Dr. Mosetta Putt

## 2023-02-15 NOTE — Assessment & Plan Note (Signed)
Will check Hepatitis C screening due to recent recommendations to screen all adults 18 years and older

## 2023-02-15 NOTE — Assessment & Plan Note (Signed)
Influenza vaccine administered Encouraged to take Tylenol as needed for fever or muscle aches.

## 2023-02-15 NOTE — Assessment & Plan Note (Signed)

## 2023-02-15 NOTE — Assessment & Plan Note (Signed)

## 2023-02-15 NOTE — Assessment & Plan Note (Signed)
She was treated with oral chemo and had a tumor removed, continues to be followed by Dr. Mosetta Putt

## 2023-02-15 NOTE — Assessment & Plan Note (Signed)
She is planning to return to Dr. Alphonzo Lemmings (Neurosurgeon).

## 2023-02-18 DIAGNOSIS — Z419 Encounter for procedure for purposes other than remedying health state, unspecified: Secondary | ICD-10-CM | POA: Diagnosis not present

## 2023-03-06 ENCOUNTER — Encounter: Payer: Self-pay | Admitting: Neurosurgery

## 2023-03-06 DIAGNOSIS — I671 Cerebral aneurysm, nonruptured: Secondary | ICD-10-CM

## 2023-03-07 ENCOUNTER — Other Ambulatory Visit: Payer: Self-pay | Admitting: Neurosurgery

## 2023-03-07 DIAGNOSIS — I671 Cerebral aneurysm, nonruptured: Secondary | ICD-10-CM

## 2023-03-18 DIAGNOSIS — Z419 Encounter for procedure for purposes other than remedying health state, unspecified: Secondary | ICD-10-CM | POA: Diagnosis not present

## 2023-03-20 NOTE — Assessment & Plan Note (Signed)
 pT2aN0M0, stage IIA -S/p cholecystectomy on 01/14/21, pathology showed incidental finding of adenocarcinoma, margins and LNs clear.  -s/p resection on 02/26/21 with Dr. Freida Busman, pathology was negative for malignancy.  -baseline CEA obtained on 03/19/21 was WNL. -she completed 8 cycles adjuvant Xeloda 03/29/21 - 09/05/21. She tolerated very well. -on surveillance .  She is clinically doing well, reports symptom of acid reflux, otherwise no concerns. -I personally reviewed her CT scan from 11/14/2022 which showed NED, and decreased size of the hypodense subcapsular focus in segment V along the gallbladder fossa adjacent to surgical clips, favored to reflect post surgical change. I discussed with pt. She is clinically doing well  -continue cancer surveillance

## 2023-03-21 ENCOUNTER — Inpatient Hospital Stay (HOSPITAL_BASED_OUTPATIENT_CLINIC_OR_DEPARTMENT_OTHER): Payer: Commercial Managed Care - HMO | Admitting: Hematology

## 2023-03-21 ENCOUNTER — Inpatient Hospital Stay: Payer: Commercial Managed Care - HMO | Attending: Hematology

## 2023-03-21 VITALS — BP 122/76 | HR 80 | Temp 98.7°F | Resp 22 | Ht 66.0 in | Wt 202.0 lb

## 2023-03-21 DIAGNOSIS — C23 Malignant neoplasm of gallbladder: Secondary | ICD-10-CM | POA: Insufficient documentation

## 2023-03-21 DIAGNOSIS — Z9221 Personal history of antineoplastic chemotherapy: Secondary | ICD-10-CM | POA: Insufficient documentation

## 2023-03-21 LAB — CMP (CANCER CENTER ONLY)
ALT: 49 U/L — ABNORMAL HIGH (ref 0–44)
AST: 36 U/L (ref 15–41)
Albumin: 4.3 g/dL (ref 3.5–5.0)
Alkaline Phosphatase: 135 U/L — ABNORMAL HIGH (ref 38–126)
Anion gap: 6 (ref 5–15)
BUN: 9 mg/dL (ref 6–20)
CO2: 25 mmol/L (ref 22–32)
Calcium: 9.3 mg/dL (ref 8.9–10.3)
Chloride: 108 mmol/L (ref 98–111)
Creatinine: 0.74 mg/dL (ref 0.44–1.00)
GFR, Estimated: >60 mL/min (ref >60)
Glucose, Bld: 101 mg/dL — ABNORMAL HIGH (ref 70–99)
Potassium: 3.9 mmol/L (ref 3.5–5.1)
Sodium: 139 mmol/L (ref 135–145)
Total Bilirubin: 0.6 mg/dL (ref 0.0–1.2)
Total Protein: 7.7 g/dL (ref 6.5–8.1)

## 2023-03-21 LAB — CBC WITH DIFFERENTIAL (CANCER CENTER ONLY)
Abs Immature Granulocytes: 0.01 10*3/uL (ref 0.00–0.07)
Basophils Absolute: 0.1 10*3/uL (ref 0.0–0.1)
Basophils Relative: 1 %
Eosinophils Absolute: 0.2 10*3/uL (ref 0.0–0.5)
Eosinophils Relative: 3 %
HCT: 42.3 % (ref 36.0–46.0)
Hemoglobin: 14.5 g/dL (ref 12.0–15.0)
Immature Granulocytes: 0 %
Lymphocytes Relative: 27 %
Lymphs Abs: 2.3 10*3/uL (ref 0.7–4.0)
MCH: 27.2 pg (ref 26.0–34.0)
MCHC: 34.3 g/dL (ref 30.0–36.0)
MCV: 79.2 fL — ABNORMAL LOW (ref 80.0–100.0)
Monocytes Absolute: 0.4 10*3/uL (ref 0.1–1.0)
Monocytes Relative: 5 %
Neutro Abs: 5.5 10*3/uL (ref 1.7–7.7)
Neutrophils Relative %: 64 %
Platelet Count: 309 10*3/uL (ref 150–400)
RBC: 5.34 MIL/uL — ABNORMAL HIGH (ref 3.87–5.11)
RDW: 12.2 % (ref 11.5–15.5)
WBC Count: 8.5 10*3/uL (ref 4.0–10.5)
nRBC: 0 % (ref 0.0–0.2)

## 2023-03-21 NOTE — Progress Notes (Signed)
 [facility]   [phone] [phone]   Clinic Follow up Note   Patient Care Team: Arnette Felts, FNP as PCP - General (General Practice) Karie Soda, MD as Consulting Physician (General Surgery) Fritzi Mandes, MD as Consulting Physician (Surgical Oncology) Malachy Mood, MD as Consulting Physician (Hematology) Pollyann Samples, NP as Nurse Practitioner (Nurse Practitioner)  Date of Service:  03/21/2023  CHIEF COMPLAINT: f/u of gallbladder cancer  CURRENT THERAPY:  Cancer surveillance  Oncology History   Adenocarcinoma of gallbladder (HCC) pT2aN0M0, stage IIA -S/p cholecystectomy on 01/14/21, pathology showed incidental finding of adenocarcinoma, margins and LNs clear.  -s/p resection on 02/26/21 with Dr. Freida Busman, pathology was negative for malignancy.  -baseline CEA obtained on 03/19/21 was WNL. -she completed 8 cycles adjuvant Xeloda 03/29/21 - 09/05/21. She tolerated very well. -on surveillance .  She is clinically doing well, reports symptom of acid reflux, otherwise no concerns. -I personally reviewed her CT scan from 11/14/2022 which showed NED, and decreased size of the hypodense subcapsular focus in segment V along the gallbladder fossa adjacent to surgical clips, favored to reflect post surgical change. I discussed with pt. She is clinically doing well  -continue cancer surveillance     Assessment and Plan    Gallbladder cancer A 38 year old female, three years post-diagnosis of gallbladder cancer, remains asymptomatic with no abdominal pain, bloating, or irregular bowel movements. Liver function tests reveal a slightly elevated ALT (49, normal up to 45), which is not concerning. Blood counts and the last tumor marker from November are normal. The risk of recurrence decreases significantly after three years, with further reduction after five years, at which point routine follow-ups may cease. - Schedule follow-up in four months for evaluation  and laboratory tests. - Continue routine surveillance with annual imaging and tumor markers until five years post-diagnosis. - Discuss reducing follow-up frequency to every six months for the fourth year and annually in the fifth year.  Brain aneurysm Undergoing routine surveillance for a brain aneurysm, with the next CT scan scheduled in a few weeks. The last scan was in October of the previous year. - Perform CT scan of the head in the next few weeks for brain aneurysm surveillance.  Routine health maintenance Up to date with routine health maintenance, including annual mammograms and Pap smears. An ultrasound for a palpable area returned normal, likely indicating fat tissue. - Continue annual mammograms and Pap smears. - Monitor any changes in the palpable area, though current ultrasound findings are normal.      Plan -Lab reviewed, exam normal, no clinical concern for recurrence -Continue cancer surveillance, lab and follow-up in 4 months, will order surveillance CT scan on next visit.     SUMMARY OF ONCOLOGIC HISTORY: Oncology History Overview Note   Cancer Staging  Adenocarcinoma of gallbladder [facility]) Staging form: Gallbladder, AJCC 8th Edition - Pathologic stage from 01/14/2021: Stage IIA (pT2a, pN0, cM0) - Signed by Pollyann Samples, NP on 02/17/2021 Total positive nodes: 0     Adenocarcinoma of gallbladder (HCC)  01/14/2021 Cancer Staging   Staging form: Gallbladder, AJCC 8th Edition - Pathologic stage from 01/14/2021: Stage IIA (pT2a, pN0, cM0) - Signed by Pollyann Samples, NP on 02/17/2021 Total positive nodes: 0   01/14/2021 Imaging   ABD US IMPRESSION: Cholelithiasis with gallbladder wall thickening and positive sonographic Murphy sign suggesting acute cholecystitis. There is no pericholecystic fluid.   Mildly increased liver echogenicity likely reflects steatosis. There are 2 relative hypoechoic areas in the  right hepatic lobe that may reflect fatty spurring or  lesions. Recommend nonemergent evaluation with MRI   01/14/2021 Surgery   Lap cholecystectomy by Dr. Karie Soda    01/14/2021 Pathology Results   A. GALLBLADDER, CHOLECYSTECTOMY:  - Invasive well to moderately differentiated adenocarcinoma, 1.6 cm,  arising in association with a gallbladder adenoma with high-grade dysplasia  - Carcinoma invades beyond the muscularis propria but not into the serosal surface  - Resection margins are negative for carcinoma  - See oncology table   COMMENT:  About 10% of the tumor shows presence of a neuroendocrine component in the form of a high-grade neuroendocrine carcinoma.  Immunostains for synaptophysin, CD56, chromogranin and Ki-67 are confirmatory.    02/08/2021 Imaging   CT Chest IMPRESSION: 9 mm right hilar lymph node is nonspecific and may be reactive. Recommend attention on follow-up studies. Otherwise, no evidence of metastatic disease in the chest.   02/09/2021 Imaging   MRI ABD IMPRESSION: 1. Expected postsurgical changes of cholecystectomy. Normal caliber common bile duct with no evidence choledocholithiasis. 2. Hepatic steatosis with areas of focal fatty sparing. No suspicious liver lesions. 3. Small cystic lesion of the tail of the pancreas measuring 4 mm. Recommend follow up pre and post contrast MRI/MRCP or pancreatic protocol CT in 1 year. This recommendation follows ACR consensus guidelines: Management of Incidental Pancreatic Cysts:    02/17/2021 Initial Diagnosis   Adenocarcinoma of gallbladder (HCC)   02/26/2021 Pathology Results   FINAL MICROSCOPIC DIAGNOSIS:   A. LIVER, CYSTIC DUCT MARGIN, EXCISION:  - Fibrosis and histiocytic infiltrate consistent with previous  procedure.  - Negative for malignancy.   B. LIVER, PARTIAL CENTRAL, HEPATECTOMY:  - Liver with focal fibrosis, histiocytic infiltrate and giant cell  reaction consistent with previous procedure.  - Negative for malignancy.   C. LYMPH NODE, PORTAL, EXCISION:  -  Lymph node negative for metastatic carcinoma.   COMMENT:  The cystic duct margin specimen shows fibrosis with histiocytic  infiltrates.  No residual carcinoma is identified.   The liver specimen consist mostly of benign liver and at the gallbladder bed there is fibrosis with histiocytic infiltrates and giant cell reaction consistent with previous cholecystectomy.  No residual carcinoma is identified.    04/22/2021 Genetic Testing   Negative genetic testing on the CancerNext-Expanded+RNAinsight.  ATM c.[id number] and DICER1 [id number] VUS were identified.  The report date is Aprili 6, 2023.  The CancerNext-Expanded gene panel offered by [facility] and includes sequencing and rearrangement analysis for the following 77 genes: AIP, ALK, APC*, ATM*, AXIN2, BAP1, BARD1, BLM, BMPR1A, BRCA1*, BRCA2*, BRIP1*, CDC73, CDH1*, CDK4, CDKN1B, CDKN2A, CHEK2*, CTNNA1, DICER1, FANCC, FH, FLCN, GALNT12, KIF1B, LZTR1, MAX, MEN1, MET, MLH1*, MSH2*, MSH3, MSH6*, MUTYH*, NBN, NF1*, NF2, NTHL1, PALB2*, PHOX2B, PMS2*, POT1, PRKAR1A, PTCH1, PTEN*, RAD51C*, RAD51D*, RB1, RECQL, RET, SDHA, SDHAF2, SDHB, SDHC, SDHD, SMAD4, SMARCA4, SMARCB1, SMARCE1, STK11, SUFU, TMEM127, TP53*, TSC1, TSC2, VHL and XRCC2 (sequencing and deletion/duplication); EGFR, EGLN1, HOXB13, KIT, MITF, PDGFRA, POLD1, and POLE (sequencing only); EPCAM and GREM1 (deletion/duplication only). DNA and RNA analyses performed for * genes.       Discussed the use of AI scribe software for clinical note transcription with the patient, who gave verbal consent to proceed.  History of Present Illness   The patient, a 38 year old with a history of gallbladder cancer diagnosed in December 2022, presents for a routine follow-up. She reports feeling well overall, with no complaints of abdominal pain or bloating. Bowel movements are regular and normal, and she has a  good appetite. Energy levels are reported as normal. She is due for a CT scan of the head in the  coming weeks for a brain aneurysm checkup. She also reports a slightly puffy area on her body, which was evaluated with an ultrasound and deemed normal. She is up-to-date with routine screenings, including mammograms and Pap smears.         All other systems were reviewed with the patient and are negative.  MEDICAL HISTORY:  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 every 6 months   Cancer (HCC)    Depression    Exercise-induced shortness of breath    "sometimes at the gym" (01/30/2012)   GERD (gastroesophageal reflux disease)    MDD (major depressive disorder), severe (HCC) 01/26/2017   No pertinent past medical history    Status post vacuum-assisted vaginal delivery 11/29/2016   Suicidal ideations    TIA (transient ischemic attack) 01/2012    SURGICAL HISTORY: Past Surgical History:  Procedure Laterality Date   CHOLECYSTECTOMY  01/14/2021   LAPAROSCOPIC CHOLECYSTECTOMY SINGLE SITE WITH INTRAOPERATIVE CHOLANGIOGRAM N/A 01/14/2021   Procedure: LAPAROSCOPIC CHOLECYSTECTOMY SINGLE SITE;  Surgeon: Karie Soda, MD;  Location: [facility] ORS;  Service: General;  Laterality: N/A;   LAPAROSCOPY N/A 02/26/2021   Procedure: STAGING LAPAROSCOPY;  Surgeon: Fritzi Mandes, MD;  Location: [facility] OR;  Service: General;  Laterality: N/A;   OPEN HEPATECTOMY  N/A 02/26/2021   Procedure: OPEN PARTIAL CENTRAL HEPATECTOMY, PORTAL LYMPH NODE DISSECTION, INTRAOPERATIVE ULTRASOUND;  Surgeon: Fritzi Mandes, MD;  Location: [facility] OR;  Service: General;  Laterality: N/A;   TEE WITHOUT CARDIOVERSION  01/31/2012   Procedure: TRANSESOPHAGEAL ECHOCARDIOGRAM (TEE);  Surgeon: Lewayne Bunting, MD;  Location: [facility] ENDOSCOPY;  Service: Cardiovascular;  Laterality: N/A;    I have reviewed the social history and family history with the patient and they are unchanged from previous note.  ALLERGIES:  has no known allergies.  MEDICATIONS:  No current  outpatient medications on file.   No current facility-administered medications for this visit.    PHYSICAL EXAMINATION: ECOG PERFORMANCE STATUS: 0 - Asymptomatic  Vitals:   03/21/23 1004  BP: 122/76  Pulse: 80  Resp: (!) 22  Temp: 98.7 F (37.1 C)  SpO2: 98%   Wt Readings from Last 3 Encounters:  03/21/23 202 lb (91.6 kg)  02/02/23 199 lb 9.6 oz (90.5 kg)  11/22/22 199 lb 11.2 oz (90.6 kg)     GENERAL:alert, no distress and comfortable SKIN: skin color, texture, turgor are normal, no rashes or significant lesions EYES: normal, Conjunctiva are pink and non-injected, sclera clear NECK: supple, thyroid normal size, non-tender, without nodularity LYMPH:  no palpable lymphadenopathy in the cervical, axillary  LUNGS: clear to auscultation and percussion with normal breathing effort HEART: regular rate & rhythm and no murmurs and no lower extremity edema ABDOMEN:abdomen soft, non-tender and normal bowel sounds Musculoskeletal:no cyanosis of digits and no clubbing  NEURO: alert & oriented x 3 with fluent speech, no focal motor/sensory deficits   LABORATORY DATA:  I have reviewed the data as listed    Latest Ref Rng & Units 03/21/2023    9:18 AM 02/02/2023   11:08 AM 11/22/2022    9:24 AM  CBC  WBC 4.0 - 10.5 K/uL 8.5  9.6  7.8   Hemoglobin 12.0 - 15.0 g/dL 04.5  40.9  81.1   Hematocrit 36.0 - 46.0 % 42.3  43.3  42.4   Platelets 150 - 400 K/uL 309  329  291         Latest Ref Rng & Units 03/21/2023    9:18 AM 11/22/2022    9:24 AM 08/18/2022    1:23 PM  CMP  Glucose 70 - 99 mg/dL 161  94  87   BUN 6 - 20 mg/dL 9  12  11    Creatinine 0.44 - 1.00 mg/dL 0.96  0.45  4.09   Sodium 135 - 145 mmol/L 139  139  138   Potassium 3.5 - 5.1 mmol/L 3.9  4.1  3.7   Chloride 98 - 111 mmol/L 108  108  107   CO2 22 - 32 mmol/L 25  26  23    Calcium 8.9 - 10.3 mg/dL 9.3  9.2  9.1   Total Protein 6.5 - 8.1 g/dL 7.7  7.2  7.4   Total Bilirubin 0.0 - 1.2 mg/dL 0.6  0.3  0.4   Alkaline Phos  38 - 126 U/L 135  115  109   AST 15 - 41 U/L 36  30  38   ALT 0 - 44 U/L 49  50  49       RADIOGRAPHIC STUDIES: I have personally reviewed the radiological images as listed and agreed with the findings in the report. No results found.    No orders of the defined types were placed in this encounter.  All questions were answered. The patient knows to call the clinic with any problems, questions or concerns. No barriers to learning was detected. The total time spent in the appointment was 15 minutes.     Malachy Mood, MD 03/21/2023

## 2023-03-22 ENCOUNTER — Telehealth: Payer: Self-pay | Admitting: Hematology

## 2023-03-22 LAB — CANCER ANTIGEN 19-9: CA 19-9: 18 U/mL (ref 0–35)

## 2023-03-22 NOTE — Telephone Encounter (Signed)
 Left patient a voicemail in regards to scheduled appointment times/dates; left callback number for patient if needing to reschedule or cancel appointments

## 2023-03-27 ENCOUNTER — Other Ambulatory Visit: Payer: Commercial Managed Care - HMO

## 2023-04-03 ENCOUNTER — Ambulatory Visit
Admission: RE | Admit: 2023-04-03 | Discharge: 2023-04-03 | Disposition: A | Discharge status: Home / Self Care | Payer: Commercial Managed Care - HMO | Source: Ambulatory Visit | Attending: Neurosurgery | Admitting: Neurosurgery

## 2023-04-03 DIAGNOSIS — I671 Cerebral aneurysm, nonruptured: Secondary | ICD-10-CM

## 2023-04-03 MED ORDER — IOPAMIDOL (ISOVUE-370) INJECTION 76%
75.0000 mL | Freq: Once | INTRAVENOUS | Status: AC | PRN
  Administered 2023-04-03: 75 mL via INTRAVENOUS

## 2023-04-23 IMAGING — MR MR ABDOMEN WO/W CM
18 of 20 series · 45 of 48 positions shown · IV contrast (gadavist)
Comparison: Ultrasound abdomen dated December 25, 2020
COMPARISON: Ultrasound abdomen dated December 25, 2020

Addendum:
CLINICAL DATA: Gallbladder cancer

EXAM:
MRI ABDOMEN WITHOUT AND WITH CONTRAST
TECHNIQUE: Multiplanar multisequence MR imaging of the abdomen was performed
both before and after the administration of intravenous contrast.
CONTRAST:  9mL GADAVIST GADOBUTROL 1 MMOL/ML IV SOLN

[Series 2: T2 fat-sat · axial · 6.0mm · 1.25mm/px · z∈[-92,+182]mm · 2 of 39 slices shown]
[im 1/39]
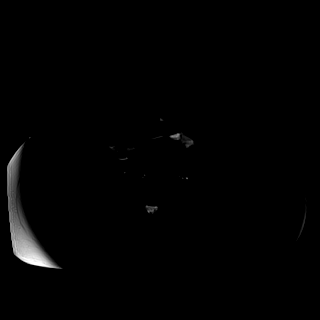
[im 39/39]
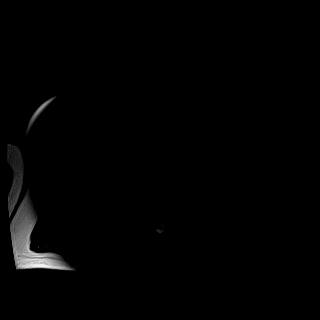

[Series 4: DWI · axial · 6.0mm · 1.49mm/px · z∈[-106,+168]mm · 4 of 78 slices shown (1 of 2)]
[im 1/78]
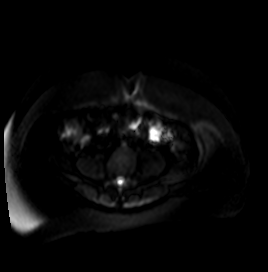
[im 26/78]
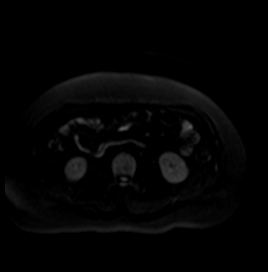
[im 52/78]
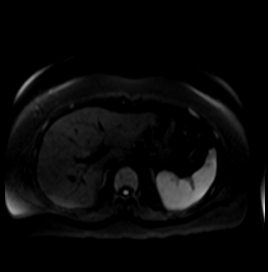
[im 78/78]
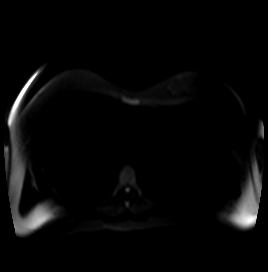

[Series 5: DWI · axial · 6.0mm · 1.49mm/px · 1 of 39 slices shown (2 of 2)]
[im 1/39]
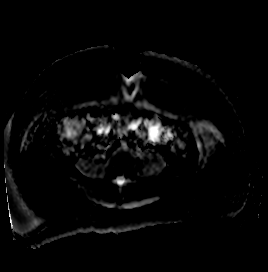

[Series 7: T1 · axial · 3.1mm · 1.25mm/px · z∈[-82,+163]mm · 3 of 80 slices shown (1 of 2)]
[im 1/80]
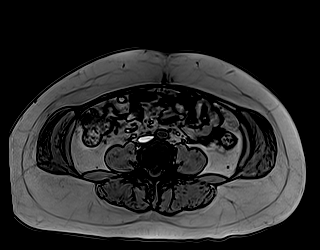
[im 40/80]
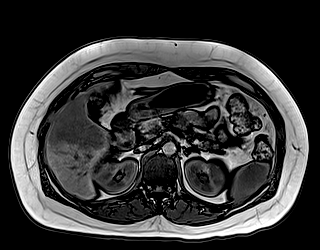
[im 80/80]
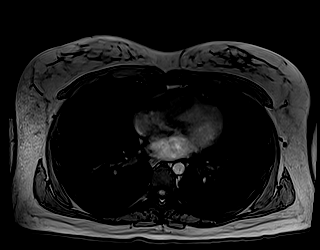

[Series 8: T1 · axial · 3.1mm · 1.25mm/px · z∈[-82,+163]mm · 3 of 80 slices shown (2 of 2)]
[im 1/80]
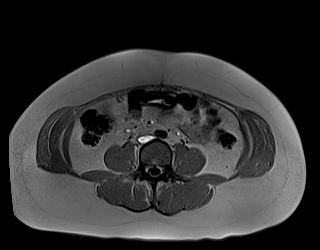
[im 40/80]
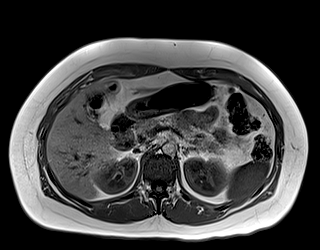
[im 80/80]
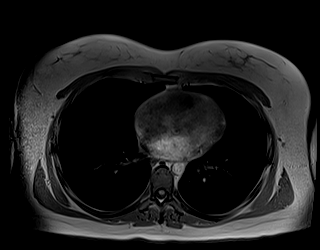

[Series 9: T2 · coronal · 7.0mm · 1.56mm/px · 1 of 30 slices shown (1 of 2)]
[im 1/30]
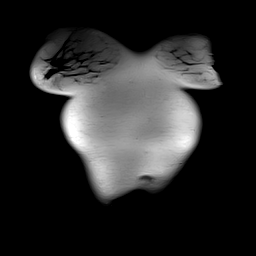

[Series 10: bSSFP · axial · 7.0mm · 1.25mm/px · 1 of 35 slices shown]
[im 1/35]
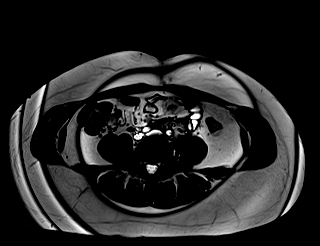

[Series 13: T1 dynamic · axial · 3.0mm · 1.25mm/px · z∈[-106,+179]mm · 3 of 96 slices shown (1 of 10)]
[im 1/96]
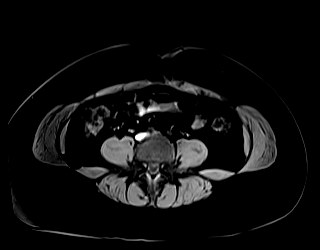
[im 48/96]
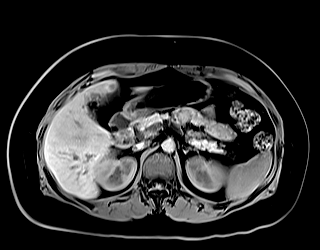
[im 96/96]
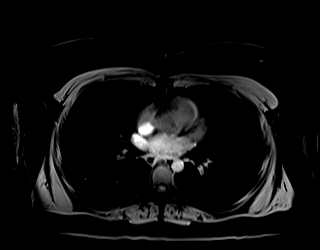

[Series 17: T1 dynamic · axial · 3.0mm · 1.25mm/px · z∈[-106,+179]mm · 3 of 96 slices shown (2 of 10)]
[im 1/96]
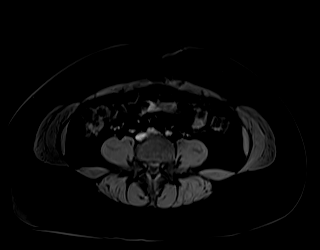
[im 48/96]
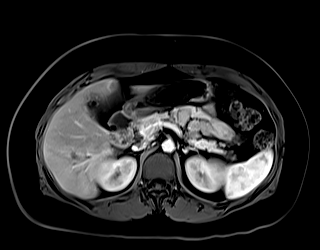
[im 96/96]
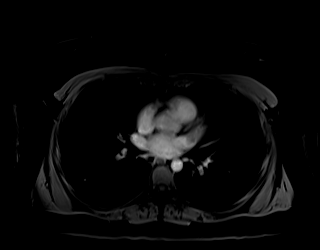

[Series 18: T1 dynamic · axial · 3.0mm · 1.25mm/px · z∈[-106,+179]mm · 3 of 96 slices shown (3 of 10)]
[im 1/96]
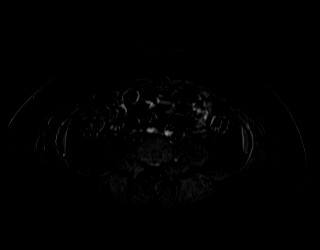
[im 48/96]
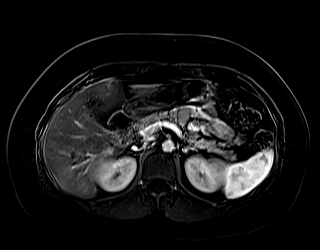
[im 96/96]
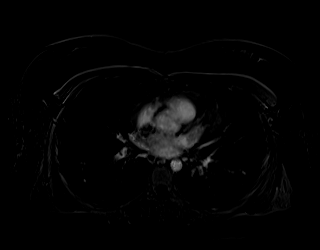

[Series 21: T1 dynamic · axial · 3.0mm · 1.25mm/px · z∈[-106,+179]mm · 3 of 96 slices shown (4 of 10)]
[im 1/96]
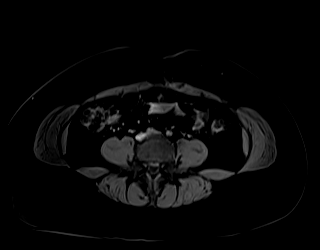
[im 48/96]
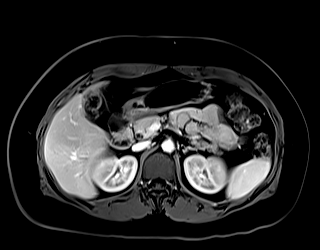
[im 96/96]
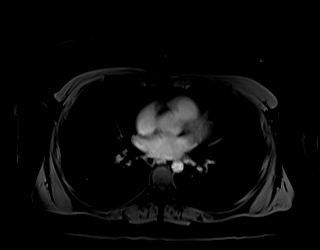

[Series 22: T1 dynamic · axial · 3.0mm · 1.25mm/px · z∈[-106,+179]mm · 3 of 96 slices shown (5 of 10)]
[im 1/96]
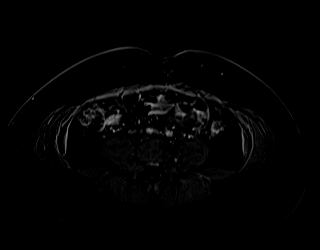
[im 48/96]
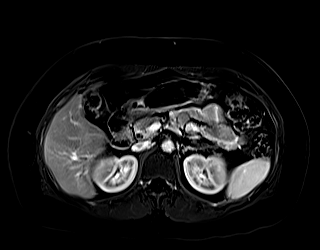
[im 96/96]
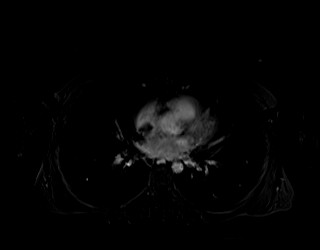

[Series 25: T1 dynamic · axial · 3.0mm · 1.25mm/px · z∈[-106,+179]mm · 3 of 96 slices shown (6 of 10)]
[im 1/96]
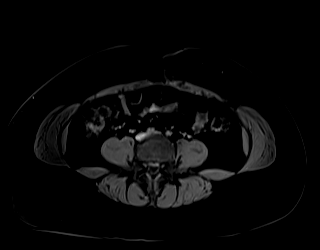
[im 48/96]
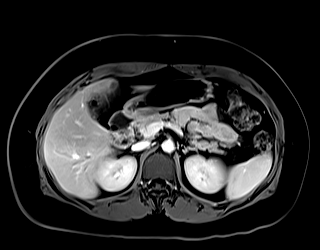
[im 96/96]
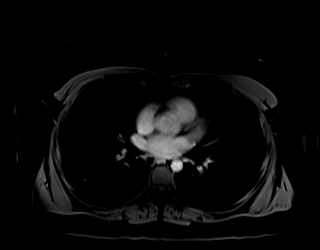

[Series 26: T1 dynamic · axial · 3.0mm · 1.25mm/px · z∈[-106,+179]mm · 3 of 96 slices shown (7 of 10)]
[im 1/96]
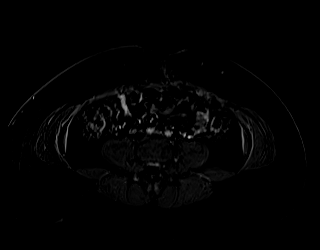
[im 48/96]
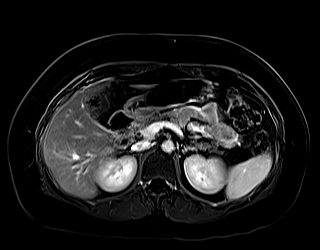
[im 96/96]
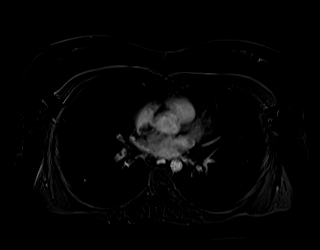

[Series 28: T1 dynamic · coronal · 5.0mm · 1.41mm/px · 2 of 56 slices shown (8 of 10)]
[im 1/56]
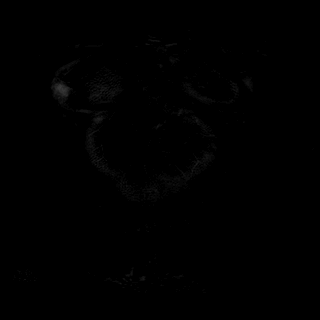
[im 56/56]
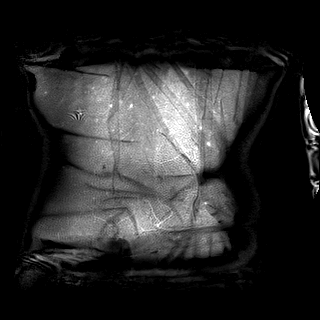

[Series 29: T2 · axial · 6.0mm · 1.56mm/px · 1 of 41 slices shown (2 of 2)]
[im 1/41]
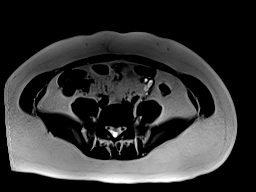

[Series 33: T1 dynamic · axial · 3.0mm · 1.25mm/px · z∈[-106,+179]mm · 3 of 96 slices shown (9 of 10)]
[im 1/96]
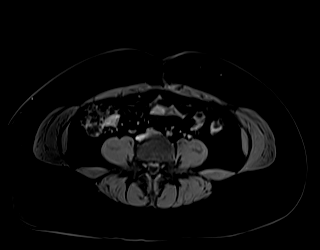
[im 48/96]
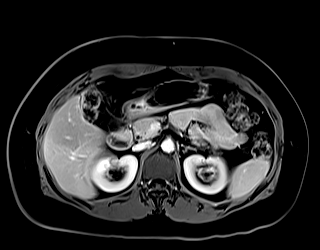
[im 96/96]
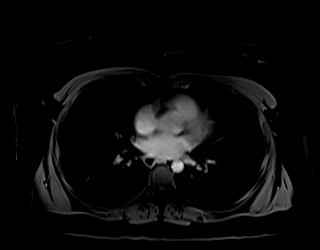

[Series 34: T1 dynamic · axial · 3.0mm · 1.25mm/px · z∈[-106,+179]mm · 3 of 96 slices shown (10 of 10)]
[im 1/96]
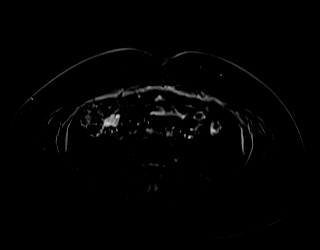
[im 48/96]
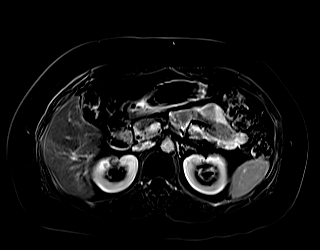
[im 96/96]
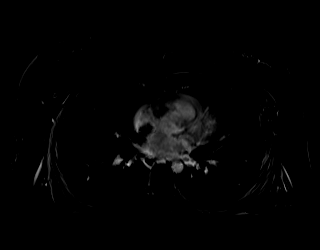

[45 of 48 positions shown; findings below may reference images not displayed]

FINDINGS: Lower chest: No acute findings.

Hepatobiliary: Large geographic area of signal dropout on out of
phase imaging compatible with hepatic steatosis. Postsurgical
changes of cholecystectomy. Common bile duct is normal in caliber
measuring up to 4 mm with no evidence of choledocholithiasis. Low
insertion of the cystic duct.

Pancreas: Small T2 hyperintense lesion of the pancreatic tail
measuring 4 mm on series 29, image 16. No peripancreatic
inflammation or inflammatory change.

Spleen:  Within normal limits in size and appearance.

Adrenals/Urinary Tract: Bilateral adrenal glands are unremarkable.
Kidneys enhance symmetrically with no evidence hydronephrosis.

Stomach/Bowel: Visualized portions within the abdomen are
unremarkable.

Vascular/Lymphatic: No pathologically enlarged lymph nodes
identified. No abdominal aortic aneurysm demonstrated.

Other:  None.

Musculoskeletal: No suspicious bone lesions identified.
IMPRESSION: 1. Expected postsurgical changes of cholecystectomy. Normal caliber
common bile duct with no evidence choledocholithiasis.
2. Hepatic steatosis with areas of focal fatty sparing. No
suspicious liver lesions.

ADDENDUM:
Corrected report: Additional impression point needed, corrected as
follows:

Small cystic lesion of the tail of the pancreas measuring 4 mm.
Recommend follow up pre and post contrast MRI/MRCP or pancreatic
protocol CT in 1 year. This recommendation follows ACR consensus
guidelines: Management of Incidental Pancreatic Cysts: A White Paper
of the ACR Incidental Findings Committee. [HOSPITAL]
5674;[DATE].

These results will be called to the ordering clinician or
representative by the Radiologist Assistant, and communication
documented in the PACS or [REDACTED].

*** End of Addendum ***
FINDINGS: Lower chest: No acute findings.

Hepatobiliary: Large geographic area of signal dropout on out of
phase imaging compatible with hepatic steatosis. Postsurgical
changes of cholecystectomy. Common bile duct is normal in caliber
measuring up to 4 mm with no evidence of choledocholithiasis. Low
insertion of the cystic duct.

Pancreas: Small T2 hyperintense lesion of the pancreatic tail
measuring 4 mm on series 29, image 16. No peripancreatic
inflammation or inflammatory change.

Spleen:  Within normal limits in size and appearance.

Adrenals/Urinary Tract: Bilateral adrenal glands are unremarkable.
Kidneys enhance symmetrically with no evidence hydronephrosis.

Stomach/Bowel: Visualized portions within the abdomen are
unremarkable.

Vascular/Lymphatic: No pathologically enlarged lymph nodes
identified. No abdominal aortic aneurysm demonstrated.

Other:  None.

Musculoskeletal: No suspicious bone lesions identified.
IMPRESSION: 1. Expected postsurgical changes of cholecystectomy. Normal caliber
common bile duct with no evidence choledocholithiasis.
2. Hepatic steatosis with areas of focal fatty sparing. No
suspicious liver lesions.

## 2023-04-26 ENCOUNTER — Ambulatory Visit (HOSPITAL_COMMUNITY): Payer: Self-pay

## 2023-04-29 DIAGNOSIS — Z419 Encounter for procedure for purposes other than remedying health state, unspecified: Secondary | ICD-10-CM | POA: Diagnosis not present

## 2023-05-04 ENCOUNTER — Ambulatory Visit: Payer: Self-pay | Admitting: Nurse Practitioner

## 2023-05-25 ENCOUNTER — Encounter: Payer: Commercial Managed Care - HMO | Admitting: Nurse Practitioner

## 2023-05-29 DIAGNOSIS — Z419 Encounter for procedure for purposes other than remedying health state, unspecified: Secondary | ICD-10-CM | POA: Diagnosis not present

## 2023-06-29 DIAGNOSIS — Z419 Encounter for procedure for purposes other than remedying health state, unspecified: Secondary | ICD-10-CM | POA: Diagnosis not present

## 2023-07-26 ENCOUNTER — Other Ambulatory Visit: Payer: Self-pay | Admitting: Nurse Practitioner

## 2023-07-26 DIAGNOSIS — C23 Malignant neoplasm of gallbladder: Secondary | ICD-10-CM

## 2023-07-26 NOTE — Assessment & Plan Note (Deleted)
 pT2aN0M0, stage IIA -S/p cholecystectomy on 01/14/21, pathology showed incidental finding of adenocarcinoma, margins and LNs clear.  -s/p resection on 02/26/21 with Dr. Freida Busman, pathology was negative for malignancy.  -baseline CEA obtained on 03/19/21 was WNL. -she completed 8 cycles adjuvant Xeloda 03/29/21 - 09/05/21. She tolerated very well. -on surveillance .  She is clinically doing well, reports symptom of acid reflux, otherwise no concerns. -I personally reviewed her CT scan from 11/14/2022 which showed NED, and decreased size of the hypodense subcapsular focus in segment V along the gallbladder fossa adjacent to surgical clips, favored to reflect post surgical change. I discussed with pt. She is clinically doing well  -continue cancer surveillance

## 2023-07-26 NOTE — Progress Notes (Deleted)
 Patient Care Team: Georgina Speaks, FNP as PCP - General (General Practice) Sheldon Standing, MD as Consulting Physician (General Surgery) Dasie Leonor CROME, MD as Consulting Physician (Surgical Oncology) Lanny Callander, MD as Consulting Physician (Hematology) Burton, Lacie K, NP as Nurse Practitioner (Nurse Practitioner)  Clinic Day:  07/26/2023  Referring physician: Georgina Speaks, FNP  ASSESSMENT & PLAN:   Assessment & Plan: Adenocarcinoma of gallbladder (HCC) pT2aN0M0, stage IIA -S/p cholecystectomy on 01/14/21, pathology showed incidental finding of adenocarcinoma, margins and LNs clear.  -s/p resection on 02/26/21 with Dr. Dasie, pathology was negative for malignancy.  -baseline CEA obtained on 03/19/21 was WNL. -she completed 8 cycles adjuvant Xeloda  03/29/21 - 09/05/21. She tolerated very well. -on surveillance .  She is clinically doing well, reports symptom of acid reflux, otherwise no concerns. -I personally reviewed her CT scan from 11/14/2022 which showed NED, and decreased size of the hypodense subcapsular focus in segment V along the gallbladder fossa adjacent to surgical clips, favored to reflect post surgical change. I discussed with pt. She is clinically doing well  -continue cancer surveillance     The patient understands the plans discussed today and is in agreement with them.  She knows to contact our office if she develops concerns prior to her next appointment.  I provided *** minutes of face-to-face time during this encounter and > 50% was spent counseling as documented under my assessment and plan.    Powell FORBES Lessen, NP  [facility] - A DEPT OF [facility] [address] [place] [address] Dept: [phone] Dept Fax: [phone]   No orders of the defined types were placed in this encounter.     CHIEF COMPLAINT:  CC: F/U gallbladder cancer  Current Treatment: Cancer surveillance  INTERVAL  HISTORY:  Azarya is here today for repeat clinical assessment.  She was last seen by Dr. Lanny on 03/21/2023.  She is due for surveillance CT CAP for cancer surveillance.  She denies fevers or chills. She denies pain. Her appetite is good. Her weight {Weight change:10426}.  I have reviewed the past medical history, past surgical history, social history and family history with the patient and they are unchanged from previous note.  ALLERGIES:  has no known allergies.  MEDICATIONS:  No current outpatient medications on file.   No current facility-administered medications for this visit.    HISTORY OF PRESENT ILLNESS:   Oncology History Overview Note   Cancer Staging  Adenocarcinoma of gallbladder [facility]) Staging form: Gallbladder, AJCC 8th Edition - Pathologic stage from 01/14/2021: Stage IIA (pT2a, pN0, cM0) - Signed by Burton, Lacie K, NP on 02/17/2021 Total positive nodes: 0     Adenocarcinoma of gallbladder (HCC)  01/14/2021 Cancer Staging   Staging form: Gallbladder, AJCC 8th Edition - Pathologic stage from 01/14/2021: Stage IIA (pT2a, pN0, cM0) - Signed by Ann Mayme POUR, NP on 02/17/2021 Total positive nodes: 0   01/14/2021 Imaging   ABD US  IMPRESSION: Cholelithiasis with gallbladder wall thickening and positive sonographic Murphy sign suggesting acute cholecystitis. There is no pericholecystic fluid.   Mildly increased liver echogenicity likely reflects steatosis. There are 2 relative hypoechoic areas in the right hepatic lobe that may reflect fatty spurring or lesions. Recommend nonemergent evaluation with MRI   01/14/2021 Surgery   Lap cholecystectomy by Dr. Standing Sheldon    01/14/2021 Pathology Results   A. GALLBLADDER, CHOLECYSTECTOMY:  - Invasive well to moderately differentiated adenocarcinoma, 1.6 cm,  arising in association with a gallbladder adenoma with high-grade dysplasia  - Carcinoma invades beyond the muscularis propria but not into the serosal surface  -  Resection margins are negative for carcinoma  - See oncology table   COMMENT:  About 10% of the tumor shows presence of a neuroendocrine component in the form of a high-grade neuroendocrine carcinoma.  Immunostains for synaptophysin, CD56, chromogranin and Ki-67 are confirmatory.    02/08/2021 Imaging   CT Chest IMPRESSION: 9 mm right hilar lymph node is nonspecific and may be reactive. Recommend attention on follow-up studies. Otherwise, no evidence of metastatic disease in the chest.   02/09/2021 Imaging   MRI ABD IMPRESSION: 1. Expected postsurgical changes of cholecystectomy. Normal caliber common bile duct with no evidence choledocholithiasis. 2. Hepatic steatosis with areas of focal fatty sparing. No suspicious liver lesions. 3. Small cystic lesion of the tail of the pancreas measuring 4 mm. Recommend follow up pre and post contrast MRI/MRCP or pancreatic protocol CT in 1 year. This recommendation follows ACR consensus guidelines: Management of Incidental Pancreatic Cysts:    02/17/2021 Initial Diagnosis   Adenocarcinoma of gallbladder (HCC)   02/26/2021 Pathology Results   FINAL MICROSCOPIC DIAGNOSIS:   A. LIVER, CYSTIC DUCT MARGIN, EXCISION:  - Fibrosis and histiocytic infiltrate consistent with previous  procedure.  - Negative for malignancy.   B. LIVER, PARTIAL CENTRAL, HEPATECTOMY:  - Liver with focal fibrosis, histiocytic infiltrate and giant cell  reaction consistent with previous procedure.  - Negative for malignancy.   C. LYMPH NODE, PORTAL, EXCISION:  - Lymph node negative for metastatic carcinoma.   COMMENT:  The cystic duct margin specimen shows fibrosis with histiocytic  infiltrates.  No residual carcinoma is identified.   The liver specimen consist mostly of benign liver and at the gallbladder bed there is fibrosis with histiocytic infiltrates and giant cell reaction consistent with previous cholecystectomy.  No residual carcinoma is identified.    04/22/2021  Genetic Testing   Negative genetic testing on the CancerNext-Expanded+RNAinsight.  ATM c.1907A>G and DICER1 [id number] VUS were identified.  The report date is Aprili 6, 2023.  The CancerNext-Expanded gene panel offered by [facility] and includes sequencing and rearrangement analysis for the following 77 genes: AIP, ALK, APC*, ATM*, AXIN2, BAP1, BARD1, BLM, BMPR1A, BRCA1*, BRCA2*, BRIP1*, CDC73, CDH1*, CDK4, CDKN1B, CDKN2A, CHEK2*, CTNNA1, DICER1, FANCC, FH, FLCN, GALNT12, KIF1B, LZTR1, MAX, MEN1, MET, MLH1*, MSH2*, MSH3, MSH6*, MUTYH*, NBN, NF1*, NF2, NTHL1, PALB2*, PHOX2B, PMS2*, POT1, PRKAR1A, PTCH1, PTEN*, RAD51C*, RAD51D*, RB1, RECQL, RET, SDHA, SDHAF2, SDHB, SDHC, SDHD, SMAD4, SMARCA4, SMARCB1, SMARCE1, STK11, SUFU, TMEM127, TP53*, TSC1, TSC2, VHL and XRCC2 (sequencing and deletion/duplication); EGFR, EGLN1, HOXB13, KIT, MITF, PDGFRA, POLD1, and POLE (sequencing only); EPCAM and GREM1 (deletion/duplication only). DNA and RNA analyses performed for * genes.        REVIEW OF SYSTEMS:   Constitutional: Denies fevers, chills or abnormal weight loss Eyes: Denies blurriness of vision Ears, nose, mouth, throat, and face: Denies mucositis or sore throat Respiratory: Denies cough, dyspnea or wheezes Cardiovascular: Denies palpitation, chest discomfort or lower extremity swelling Gastrointestinal:  Denies nausea, heartburn or change in bowel habits Skin: Denies abnormal skin rashes Lymphatics: Denies new lymphadenopathy or easy bruising Neurological:Denies numbness, tingling or new weaknesses Behavioral/Psych: Mood is stable, no new changes  All other systems were reviewed with the patient and are negative.   VITALS:  There were no vitals taken for this visit.  Wt Readings from Last 3 Encounters:  03/21/23 202 lb (91.6 kg)  02/02/23 199 lb 9.6 oz (90.5 kg)  11/22/22 199 lb 11.2 oz (90.6 kg)    There is no height or weight on file to calculate BMI.  Performance status (ECOG):  {CHL ONC [id number]  PHYSICAL EXAM:   GENERAL:alert, no distress and comfortable SKIN: skin color, texture, turgor are normal, no rashes or significant lesions EYES: normal, Conjunctiva are pink and non-injected, sclera clear OROPHARYNX:no exudate, no erythema and lips, buccal mucosa, and tongue normal  NECK: supple, thyroid  normal size, non-tender, without nodularity LYMPH:  no palpable lymphadenopathy in the cervical, axillary or inguinal LUNGS: clear to auscultation and percussion with normal breathing effort HEART: regular rate & rhythm and no murmurs and no lower extremity edema ABDOMEN:abdomen soft, non-tender and normal bowel sounds Musculoskeletal:no cyanosis of digits and no clubbing  NEURO: alert & oriented x 3 with fluent speech, no focal motor/sensory deficits  LABORATORY DATA:  I have reviewed the data as listed    Component Value Date/Time   NA 139 03/21/2023 0918   K 3.9 03/21/2023 0918   CL 108 03/21/2023 0918   CO2 25 03/21/2023 0918   GLUCOSE 101 (H) 03/21/2023 0918   BUN 9 03/21/2023 0918   CREATININE 0.74 03/21/2023 0918   CREATININE 0.64 11/02/2011 1133   CALCIUM  9.3 03/21/2023 0918   PROT 7.7 03/21/2023 0918   ALBUMIN  4.3 03/21/2023 0918   AST 36 03/21/2023 0918   ALT 49 (H) 03/21/2023 0918   ALKPHOS 135 (H) 03/21/2023 0918   BILITOT 0.6 03/21/2023 0918   GFRNONAA >60 03/21/2023 0918   GFRAA >60 01/25/2017 1547    No results found for: SPEP, UPEP  Lab Results  Component Value Date   WBC 8.5 03/21/2023   NEUTROABS 5.5 03/21/2023   HGB 14.5 03/21/2023   HCT 42.3 03/21/2023   MCV 79.2 (L) 03/21/2023   PLT 309 03/21/2023      Chemistry      Component Value Date/Time   NA 139 03/21/2023 0918   K 3.9 03/21/2023 0918   CL 108 03/21/2023 0918   CO2 25 03/21/2023 0918   BUN 9 03/21/2023 0918   CREATININE 0.74 03/21/2023 0918   CREATININE 0.64 11/02/2011 1133      Component Value Date/Time   CALCIUM  9.3 03/21/2023 0918   ALKPHOS 135  (H) 03/21/2023 0918   AST 36 03/21/2023 0918   ALT 49 (H) 03/21/2023 0918   BILITOT 0.6 03/21/2023 0918       RADIOGRAPHIC STUDIES: I have personally reviewed the radiological images as listed and agreed with the findings in the report. No results found.

## 2023-07-27 ENCOUNTER — Inpatient Hospital Stay: Attending: Nurse Practitioner

## 2023-07-27 ENCOUNTER — Inpatient Hospital Stay: Admitting: Nurse Practitioner

## 2023-07-27 DIAGNOSIS — C23 Malignant neoplasm of gallbladder: Secondary | ICD-10-CM

## 2023-07-29 DIAGNOSIS — Z419 Encounter for procedure for purposes other than remedying health state, unspecified: Secondary | ICD-10-CM | POA: Diagnosis not present

## 2023-10-16 ENCOUNTER — Encounter: Admitting: Nurse Practitioner

## 2023-11-07 NOTE — Assessment & Plan Note (Addendum)
 pT2aN0M0, stage IIA -S/p cholecystectomy on 01/14/21, pathology showed incidental finding of adenocarcinoma, margins and LNs clear.  -s/p resection on 02/26/21 with Dr. Dasie, pathology was negative for malignancy.  -baseline CEA obtained on 03/19/21 was WNL. -she completed 8 cycles adjuvant Xeloda  03/29/21 - 09/05/21. She tolerated very well. -on surveillance .  She is clinically doing well, reports symptom of acid reflux, otherwise no concerns. -Her CT scan from 11/14/2022 was reviewed which showed NED, and decreased size of the hypodense subcapsular focus in segment V along the gallbladder fossa adjacent to surgical clips, favored to reflect post surgical change. Results were discussed with the patient. She is clinically doing well  -continue cancer surveillance. -- She is due for surveillance CT CAP next few weeks. -Plan for labs and follow-up in 6 months, sooner if needed.

## 2023-11-07 NOTE — Progress Notes (Signed)
 Patient Care Team: Georgina Speaks, FNP as PCP - General (General Practice) Sheldon Standing, MD as Consulting Physician (General Surgery) Dasie Leonor CROME, MD as Consulting Physician (Surgical Oncology) Lanny Callander, MD as Consulting Physician (Hematology) Burton, Lacie K, NP as Nurse Practitioner (Nurse Practitioner)  Clinic Day:  11/08/2023  Referring physician: Georgina Speaks, FNP  ASSESSMENT & PLAN:   Assessment & Plan: Adenocarcinoma of gallbladder (HCC) pT2aN0M0, stage IIA -S/p cholecystectomy on 01/14/21, pathology showed incidental finding of adenocarcinoma, margins and LNs clear.  -s/p resection on 02/26/21 with Dr. Dasie, pathology was negative for malignancy.  -baseline CEA obtained on 03/19/21 was WNL. -she completed 8 cycles adjuvant Xeloda  03/29/21 - 09/05/21. She tolerated very well. -on surveillance .  She is clinically doing well, reports symptom of acid reflux, otherwise no concerns. -Her CT scan from 11/14/2022 was reviewed which showed NED, and decreased size of the hypodense subcapsular focus in segment V along the gallbladder fossa adjacent to surgical clips, favored to reflect post surgical change. Results were discussed with the patient. She is clinically doing well  -continue cancer surveillance. -- She is due for surveillance CT CAP next few weeks. -Plan for labs and follow-up in 6 months, sooner if needed.    GERD Patient continues to have significant acid reflux type symptoms.  Taking Tums when needed.  This is often several times per day.  Will refer to GI for further evaluation.  May require endoscopy for further evaluation.  History gallbladder cancer Patient clinically 2-3 years postdiagnosis.  CA 19-9 is normal at 16.  Slight elevation (stable) of ALT at 45.  AST and alkaline phosphatase both normal.  She is due for annual surveillance CT CAP.  Plan Labs reviewed. - CBC unremarkable - CMP unremarkable other than slight, stable elevation of ALT at 45. - CA  19-9 normal at 16. Due for surveillance CT CAP. Refer to GI for evaluation of chronic and worsening GERD symptoms. Plan for labs and follow-up in 6 months, sooner if needed.  -Continue routine surveillance with annual imaging and tumor markers until five years post-diagnosis.   The patient understands the plans discussed today and is in agreement with them.  She knows to contact our office if she develops concerns prior to her next appointment.  I provided 25 minutes of face-to-face time during this encounter and > 50% was spent counseling as documented under my assessment and plan.    Powell FORBES Lessen, NP  [facility] - A DEPT [facility] [address] [place] [address] Dept: [phone] Dept Fax: [phone]   Orders Placed This Encounter  Procedures   Ambulatory referral to Gastroenterology    Referral Priority:   Routine    Referral Type:   Consultation    Referral Reason:   Specialty Services Required    Number of Visits Requested:   1      CHIEF COMPLAINT:  CC: Follow-up gallbladder cancer   Current Treatment:  cancer surveillance  INTERVAL HISTORY:  Shelby Cunningham is here today for repeat clinical assessment.  She last saw Dr. Lanny on 03/21/2023.  She continues to have symptoms of GERD.  She is taking Tums multiple times per day.  Has not seen GI for endoscopy or further evaluation of cause.  She states otherwise, she is doing well.  She is due for surveillance CT CAP.  She denies chest pain, chest pressure, or shortness of breath. She denies headaches or  visual disturbances. She denies abdominal pain, nausea, vomiting, or changes in bowel or bladder habits.  She denies fevers or chills. She denies pain. Her appetite is good. Her weight has increased 4 pounds over last 6 months.  I have reviewed the past medical history, past surgical history, social history and family history with the patient and they are  unchanged from previous note.  ALLERGIES:  has no known allergies.  MEDICATIONS:  Current Outpatient Medications  Medication Sig Dispense Refill   Ferrous Sulfate (IRON PO) Take 1 tablet by mouth daily.     minocycline (MINOCIN) 100 MG capsule Take 100 mg by mouth 2 (two) times daily.     No current facility-administered medications for this visit.    HISTORY OF PRESENT ILLNESS:   Oncology History Overview Note   Cancer Staging  Adenocarcinoma of gallbladder [facility]) Staging form: Gallbladder, AJCC 8th Edition - Pathologic stage from 01/14/2021: Stage IIA (pT2a, pN0, cM0) - Signed by Burton, Lacie K, NP on 02/17/2021 Total positive nodes: 0     Adenocarcinoma of gallbladder (HCC)  01/14/2021 Cancer Staging   Staging form: Gallbladder, AJCC 8th Edition - Pathologic stage from 01/14/2021: Stage IIA (pT2a, pN0, cM0) - Signed by Ann Mayme POUR, NP on 02/17/2021 Total positive nodes: 0   01/14/2021 Imaging   ABD US  IMPRESSION: Cholelithiasis with gallbladder wall thickening and positive sonographic Murphy sign suggesting acute cholecystitis. There is no pericholecystic fluid.   Mildly increased liver echogenicity likely reflects steatosis. There are 2 relative hypoechoic areas in the right hepatic lobe that may reflect fatty spurring or lesions. Recommend nonemergent evaluation with MRI   01/14/2021 Surgery   Lap cholecystectomy by Dr. Elspeth Schultze    01/14/2021 Pathology Results   A. GALLBLADDER, CHOLECYSTECTOMY:  - Invasive well to moderately differentiated adenocarcinoma, 1.6 cm,  arising in association with a gallbladder adenoma with high-grade dysplasia  - Carcinoma invades beyond the muscularis propria but not into the serosal surface  - Resection margins are negative for carcinoma  - See oncology table   COMMENT:  About 10% of the tumor shows presence of a neuroendocrine component in the form of a high-grade neuroendocrine carcinoma.  Immunostains for synaptophysin, CD56,  chromogranin and Ki-67 are confirmatory.    02/08/2021 Imaging   CT Chest IMPRESSION: 9 mm right hilar lymph node is nonspecific and may be reactive. Recommend attention on follow-up studies. Otherwise, no evidence of metastatic disease in the chest.   02/09/2021 Imaging   MRI ABD IMPRESSION: 1. Expected postsurgical changes of cholecystectomy. Normal caliber common bile duct with no evidence choledocholithiasis. 2. Hepatic steatosis with areas of focal fatty sparing. No suspicious liver lesions. 3. Small cystic lesion of the tail of the pancreas measuring 4 mm. Recommend follow up pre and post contrast MRI/MRCP or pancreatic protocol CT in 1 year. This recommendation follows ACR consensus guidelines: Management of Incidental Pancreatic Cysts:    02/17/2021 Initial Diagnosis   Adenocarcinoma of gallbladder (HCC)   02/26/2021 Pathology Results   FINAL MICROSCOPIC DIAGNOSIS:   A. LIVER, CYSTIC DUCT MARGIN, EXCISION:  - Fibrosis and histiocytic infiltrate consistent with previous  procedure.  - Negative for malignancy.   B. LIVER, PARTIAL CENTRAL, HEPATECTOMY:  - Liver with focal fibrosis, histiocytic infiltrate and giant cell  reaction consistent with previous procedure.  - Negative for malignancy.   C. LYMPH NODE, PORTAL, EXCISION:  - Lymph node negative for metastatic carcinoma.   COMMENT:  The cystic duct margin specimen shows fibrosis with histiocytic  infiltrates.  No residual carcinoma is identified.   The liver specimen consist mostly of benign liver and at the gallbladder bed there is fibrosis with histiocytic infiltrates and giant cell reaction consistent with previous cholecystectomy.  No residual carcinoma is identified.    04/22/2021 Genetic Testing   Negative genetic testing on the CancerNext-Expanded+RNAinsight.  ATM c.1907A>G and DICER1 c.[id number] VUS were identified.  The report date is Aprili 6, 2023.  The CancerNext-Expanded gene panel offered by [facility]  and includes sequencing and rearrangement analysis for the following 77 genes: AIP, ALK, APC*, ATM*, AXIN2, BAP1, BARD1, BLM, BMPR1A, BRCA1*, BRCA2*, BRIP1*, CDC73, CDH1*, CDK4, CDKN1B, CDKN2A, CHEK2*, CTNNA1, DICER1, FANCC, FH, FLCN, GALNT12, KIF1B, LZTR1, MAX, MEN1, MET, MLH1*, MSH2*, MSH3, MSH6*, MUTYH*, NBN, NF1*, NF2, NTHL1, PALB2*, PHOX2B, PMS2*, POT1, PRKAR1A, PTCH1, PTEN*, RAD51C*, RAD51D*, RB1, RECQL, RET, SDHA, SDHAF2, SDHB, SDHC, SDHD, SMAD4, SMARCA4, SMARCB1, SMARCE1, STK11, SUFU, TMEM127, TP53*, TSC1, TSC2, VHL and XRCC2 (sequencing and deletion/duplication); EGFR, EGLN1, HOXB13, KIT, MITF, PDGFRA, POLD1, and POLE (sequencing only); EPCAM and GREM1 (deletion/duplication only). DNA and RNA analyses performed for * genes.   Update: ATM c.1907A>G VUS reclassified to likely benign. Amended report date is 11/01/2023.       REVIEW OF SYSTEMS:   Constitutional: Denies fevers, chills or abnormal weight loss Eyes: Denies blurriness of vision Ears, nose, mouth, throat, and face: Denies mucositis or sore throat Respiratory: Denies cough, dyspnea or wheezes Cardiovascular: Denies palpitation, chest discomfort or lower extremity swelling Gastrointestinal:  Denies nausea, vomiting, or change in bowel habits.  Does have persistent acid reflux with heartburn.  Often takes Tums. Skin: Denies abnormal skin rashes Lymphatics: Denies new lymphadenopathy or easy bruising Neurological:Denies numbness, tingling or new weaknesses Behavioral/Psych: Mood is stable, no new changes  All other systems were reviewed with the patient and are negative.   VITALS:   Today's Vitals   11/08/23 0916 11/08/23 0938  BP: 138/84   Pulse: 71   Resp: 17   Temp: 98.1 F (36.7 C)   SpO2: 99%   Weight: 206 lb 11.2 oz (93.8 kg)   PainSc:  0-No pain   Body mass index is 33.36 kg/m.   Wt Readings from Last 3 Encounters:  11/08/23 206 lb 11.2 oz (93.8 kg)  03/21/23 202 lb (91.6 kg)  02/02/23 199 lb 9.6 oz (90.5  kg)    Body mass index is 33.36 kg/m.  Performance status (ECOG): 1 - Symptomatic but completely ambulatory  PHYSICAL EXAM:   GENERAL:alert, no distress and comfortable SKIN: skin color, texture, turgor are normal, no rashes or significant lesions EYES: normal, Conjunctiva are pink and non-injected, sclera clear OROPHARYNX:no exudate, no erythema and lips, buccal mucosa, and tongue normal  NECK: supple, thyroid  normal size, non-tender, without nodularity LYMPH:  no palpable lymphadenopathy in the cervical, axillary or inguinal LUNGS: clear to auscultation and percussion with normal breathing effort HEART: regular rate & rhythm and no murmurs and no lower extremity edema ABDOMEN:abdomen soft, non-tender and normal bowel sounds Musculoskeletal:no cyanosis of digits and no clubbing  NEURO: alert & oriented x 3 with fluent speech, no focal motor/sensory deficits  LABORATORY DATA:  I have reviewed the data as listed    Component Value Date/Time   NA 138 11/08/2023 0901   K 3.9 11/08/2023 0901   CL 106 11/08/2023 0901   CO2 26 11/08/2023 0901   GLUCOSE 96 11/08/2023 0901   BUN 7 11/08/2023 0901   CREATININE 0.69 11/08/2023 0901   CREATININE 0.64 11/02/2011 1133   CALCIUM   9.4 11/08/2023 0901   PROT 7.1 11/08/2023 0901   ALBUMIN  4.1 11/08/2023 0901   AST 30 11/08/2023 0901   ALT 45 (H) 11/08/2023 0901   ALKPHOS 114 11/08/2023 0901   BILITOT 0.6 11/08/2023 0901   GFRNONAA >60 11/08/2023 0901   GFRAA >60 01/25/2017 1547    Lab Results  Component Value Date   WBC 8.5 11/08/2023   NEUTROABS 5.8 11/08/2023   HGB 14.2 11/08/2023   HCT 40.6 11/08/2023   MCV 78.7 (L) 11/08/2023   PLT 291 11/08/2023

## 2023-11-08 ENCOUNTER — Inpatient Hospital Stay: Attending: Nurse Practitioner | Admitting: Nurse Practitioner

## 2023-11-08 ENCOUNTER — Inpatient Hospital Stay

## 2023-11-08 VITALS — BP 138/84 | HR 71 | Temp 98.1°F | Resp 17 | Wt 206.7 lb

## 2023-11-08 DIAGNOSIS — Z9221 Personal history of antineoplastic chemotherapy: Secondary | ICD-10-CM | POA: Insufficient documentation

## 2023-11-08 DIAGNOSIS — C23 Malignant neoplasm of gallbladder: Secondary | ICD-10-CM | POA: Insufficient documentation

## 2023-11-08 DIAGNOSIS — K219 Gastro-esophageal reflux disease without esophagitis: Secondary | ICD-10-CM | POA: Diagnosis not present

## 2023-11-08 LAB — CBC WITH DIFFERENTIAL (CANCER CENTER ONLY)
Abs Immature Granulocytes: 0.01 K/uL (ref 0.00–0.07)
Basophils Absolute: 0.1 K/uL (ref 0.0–0.1)
Basophils Relative: 1 %
Eosinophils Absolute: 0.3 K/uL (ref 0.0–0.5)
Eosinophils Relative: 3 %
HCT: 40.6 % (ref 36.0–46.0)
Hemoglobin: 14.2 g/dL (ref 12.0–15.0)
Immature Granulocytes: 0 %
Lymphocytes Relative: 23 %
Lymphs Abs: 1.9 K/uL (ref 0.7–4.0)
MCH: 27.5 pg (ref 26.0–34.0)
MCHC: 35 g/dL (ref 30.0–36.0)
MCV: 78.7 fL — ABNORMAL LOW (ref 80.0–100.0)
Monocytes Absolute: 0.4 K/uL (ref 0.1–1.0)
Monocytes Relative: 5 %
Neutro Abs: 5.8 K/uL (ref 1.7–7.7)
Neutrophils Relative %: 68 %
Platelet Count: 291 K/uL (ref 150–400)
RBC: 5.16 MIL/uL — ABNORMAL HIGH (ref 3.87–5.11)
RDW: 12.2 % (ref 11.5–15.5)
WBC Count: 8.5 K/uL (ref 4.0–10.5)
nRBC: 0 % (ref 0.0–0.2)

## 2023-11-08 LAB — CMP (CANCER CENTER ONLY)
ALT: 45 U/L — ABNORMAL HIGH (ref 0–44)
AST: 30 U/L (ref 15–41)
Albumin: 4.1 g/dL (ref 3.5–5.0)
Alkaline Phosphatase: 114 U/L (ref 38–126)
Anion gap: 6 (ref 5–15)
BUN: 7 mg/dL (ref 6–20)
CO2: 26 mmol/L (ref 22–32)
Calcium: 9.4 mg/dL (ref 8.9–10.3)
Chloride: 106 mmol/L (ref 98–111)
Creatinine: 0.69 mg/dL (ref 0.44–1.00)
GFR, Estimated: >60 mL/min (ref >60)
Glucose, Bld: 96 mg/dL (ref 70–99)
Potassium: 3.9 mmol/L (ref 3.5–5.1)
Sodium: 138 mmol/L (ref 135–145)
Total Bilirubin: 0.6 mg/dL (ref 0.0–1.2)
Total Protein: 7.1 g/dL (ref 6.5–8.1)

## 2023-11-09 LAB — CANCER ANTIGEN 19-9: CA 19-9: 16 U/mL (ref 0–35)

## 2023-11-20 ENCOUNTER — Encounter: Payer: Self-pay | Admitting: Nurse Practitioner
